# Patient Record
Sex: Female | Born: 1965
Health system: Southern US, Community
[De-identification: ages and names within clinical notes are randomized; demographics above are authoritative.]

## PROBLEM LIST (undated history)

## (undated) DIAGNOSIS — Z8669 Personal history of other diseases of the nervous system and sense organs: Secondary | ICD-10-CM

## (undated) DIAGNOSIS — H269 Unspecified cataract: Secondary | ICD-10-CM

## (undated) DIAGNOSIS — T7840XA Allergy, unspecified, initial encounter: Secondary | ICD-10-CM

## (undated) DIAGNOSIS — F419 Anxiety disorder, unspecified: Secondary | ICD-10-CM

## (undated) DIAGNOSIS — M1990 Unspecified osteoarthritis, unspecified site: Secondary | ICD-10-CM

## (undated) DIAGNOSIS — E785 Hyperlipidemia, unspecified: Secondary | ICD-10-CM

## (undated) HISTORY — PX: EYE SURGERY: SHX253

## (undated) HISTORY — PX: LASIK: SHX215

## (undated) HISTORY — DX: Anxiety disorder, unspecified: F41.9

## (undated) HISTORY — PX: CHOLECYSTECTOMY OPEN: SUR202

## (undated) HISTORY — DX: Unspecified cataract: H26.9

## (undated) HISTORY — DX: Allergy, unspecified, initial encounter: T78.40XA

## (undated) HISTORY — DX: Unspecified osteoarthritis, unspecified site: M19.90

## (undated) HISTORY — PX: LEEP: SHX91

## (undated) HISTORY — DX: Hyperlipidemia, unspecified: E78.5

---

## 1998-08-27 ENCOUNTER — Inpatient Hospital Stay (HOSPITAL_COMMUNITY): Admission: AD | Admit: 1998-08-27 | Discharge: 1998-08-27 | Payer: Self-pay | Admitting: Obstetrics and Gynecology

## 1998-10-14 ENCOUNTER — Encounter: Payer: Self-pay | Admitting: Obstetrics and Gynecology

## 1998-10-14 ENCOUNTER — Ambulatory Visit (HOSPITAL_COMMUNITY): Admission: RE | Admit: 1998-10-14 | Discharge: 1998-10-14 | Payer: Self-pay | Admitting: Obstetrics and Gynecology

## 1999-01-11 ENCOUNTER — Inpatient Hospital Stay (HOSPITAL_COMMUNITY): Admission: AD | Admit: 1999-01-11 | Discharge: 1999-01-11 | Payer: Self-pay | Admitting: Obstetrics and Gynecology

## 1999-03-06 ENCOUNTER — Inpatient Hospital Stay (HOSPITAL_COMMUNITY): Admission: AD | Admit: 1999-03-06 | Discharge: 1999-03-09 | Payer: Self-pay | Admitting: Obstetrics and Gynecology

## 1999-03-14 ENCOUNTER — Encounter: Admission: RE | Admit: 1999-03-14 | Discharge: 1999-06-12 | Payer: Self-pay | Admitting: Obstetrics and Gynecology

## 1999-04-18 ENCOUNTER — Other Ambulatory Visit: Admission: RE | Admit: 1999-04-18 | Discharge: 1999-04-18 | Payer: Self-pay | Admitting: Obstetrics and Gynecology

## 2000-03-22 ENCOUNTER — Other Ambulatory Visit: Admission: RE | Admit: 2000-03-22 | Discharge: 2000-03-22 | Payer: Self-pay | Admitting: Obstetrics and Gynecology

## 2000-06-24 ENCOUNTER — Other Ambulatory Visit: Admission: RE | Admit: 2000-06-24 | Discharge: 2000-06-24 | Payer: Self-pay | Admitting: Obstetrics and Gynecology

## 2000-09-09 ENCOUNTER — Inpatient Hospital Stay (HOSPITAL_COMMUNITY): Admission: AD | Admit: 2000-09-09 | Discharge: 2000-09-09 | Payer: Self-pay | Admitting: Obstetrics and Gynecology

## 2000-09-20 ENCOUNTER — Inpatient Hospital Stay (HOSPITAL_COMMUNITY): Admission: AD | Admit: 2000-09-20 | Discharge: 2000-09-20 | Payer: Self-pay | Admitting: Obstetrics and Gynecology

## 2000-10-15 ENCOUNTER — Other Ambulatory Visit: Admission: RE | Admit: 2000-10-15 | Discharge: 2000-10-15 | Payer: Self-pay | Admitting: Obstetrics and Gynecology

## 2000-11-14 ENCOUNTER — Encounter: Payer: Self-pay | Admitting: Obstetrics and Gynecology

## 2000-11-14 ENCOUNTER — Ambulatory Visit (HOSPITAL_COMMUNITY): Admission: RE | Admit: 2000-11-14 | Discharge: 2000-11-14 | Payer: Self-pay | Admitting: Obstetrics and Gynecology

## 2001-04-15 ENCOUNTER — Encounter (INDEPENDENT_AMBULATORY_CARE_PROVIDER_SITE_OTHER): Payer: Self-pay

## 2001-04-15 ENCOUNTER — Inpatient Hospital Stay (HOSPITAL_COMMUNITY): Admission: AD | Admit: 2001-04-15 | Discharge: 2001-04-17 | Payer: Self-pay | Admitting: Obstetrics and Gynecology

## 2004-05-22 ENCOUNTER — Emergency Department (HOSPITAL_COMMUNITY): Admission: EM | Admit: 2004-05-22 | Discharge: 2004-05-22 | Payer: Self-pay | Admitting: Emergency Medicine

## 2005-05-02 ENCOUNTER — Ambulatory Visit (HOSPITAL_COMMUNITY): Admission: RE | Admit: 2005-05-02 | Discharge: 2005-05-02 | Payer: Self-pay | Admitting: Obstetrics and Gynecology

## 2006-05-07 ENCOUNTER — Ambulatory Visit (HOSPITAL_COMMUNITY): Admission: RE | Admit: 2006-05-07 | Discharge: 2006-05-07 | Payer: Self-pay | Admitting: Obstetrics and Gynecology

## 2006-08-01 ENCOUNTER — Inpatient Hospital Stay (HOSPITAL_COMMUNITY): Admission: AD | Admit: 2006-08-01 | Discharge: 2006-08-06 | Payer: Self-pay | Admitting: Gastroenterology

## 2006-08-01 ENCOUNTER — Encounter: Payer: Self-pay | Admitting: Gastroenterology

## 2006-08-01 ENCOUNTER — Encounter (INDEPENDENT_AMBULATORY_CARE_PROVIDER_SITE_OTHER): Payer: Self-pay | Admitting: Specialist

## 2006-08-14 ENCOUNTER — Ambulatory Visit (HOSPITAL_COMMUNITY): Admission: RE | Admit: 2006-08-14 | Discharge: 2006-08-14 | Payer: Self-pay | Admitting: General Surgery

## 2006-08-25 ENCOUNTER — Emergency Department (HOSPITAL_COMMUNITY): Admission: EM | Admit: 2006-08-25 | Discharge: 2006-08-25 | Payer: Self-pay | Admitting: Emergency Medicine

## 2006-08-28 ENCOUNTER — Ambulatory Visit (HOSPITAL_COMMUNITY): Admission: RE | Admit: 2006-08-28 | Discharge: 2006-08-28 | Payer: Self-pay | Admitting: Surgery

## 2006-11-11 ENCOUNTER — Ambulatory Visit (HOSPITAL_COMMUNITY): Admission: RE | Admit: 2006-11-11 | Discharge: 2006-11-11 | Payer: Self-pay | Admitting: Gastroenterology

## 2007-05-09 ENCOUNTER — Ambulatory Visit (HOSPITAL_COMMUNITY): Admission: RE | Admit: 2007-05-09 | Discharge: 2007-05-09 | Payer: Self-pay | Admitting: Obstetrics and Gynecology

## 2008-05-11 ENCOUNTER — Ambulatory Visit (HOSPITAL_COMMUNITY): Admission: RE | Admit: 2008-05-11 | Discharge: 2008-05-11 | Payer: Self-pay | Admitting: Obstetrics and Gynecology

## 2009-05-26 ENCOUNTER — Ambulatory Visit (HOSPITAL_COMMUNITY): Admission: RE | Admit: 2009-05-26 | Discharge: 2009-05-26 | Payer: Self-pay | Admitting: Obstetrics and Gynecology

## 2010-08-06 ENCOUNTER — Ambulatory Visit: Payer: BC Managed Care – PPO | Admitting: Family Medicine

## 2010-08-06 ENCOUNTER — Encounter: Payer: Self-pay | Admitting: Family Medicine

## 2010-08-06 DIAGNOSIS — J069 Acute upper respiratory infection, unspecified: Secondary | ICD-10-CM

## 2010-08-06 DIAGNOSIS — J019 Acute sinusitis, unspecified: Secondary | ICD-10-CM

## 2010-08-06 DIAGNOSIS — R300 Dysuria: Secondary | ICD-10-CM

## 2010-08-06 DIAGNOSIS — G43909 Migraine, unspecified, not intractable, without status migrainosus: Secondary | ICD-10-CM | POA: Insufficient documentation

## 2010-08-06 DIAGNOSIS — N390 Urinary tract infection, site not specified: Secondary | ICD-10-CM

## 2010-08-06 LAB — CONVERTED CEMR LAB
Nitrite: NEGATIVE
Protein, U semiquant: NEGATIVE
Urobilinogen, UA: 0.2
pH: 6.5

## 2010-08-08 ENCOUNTER — Telehealth: Payer: Self-pay | Admitting: Internal Medicine

## 2010-08-17 NOTE — Progress Notes (Signed)
  Phone Note Outgoing Call   Call placed by: Hyacinth Meeker md Call placed to: Patient Summary of Call: left message to return call. Was going to tell her of urine c & s. Her e.coli responsive to levaquin     Appended Document:  Patient returned call, is improved, and given info.

## 2010-08-17 NOTE — Assessment & Plan Note (Signed)
Summary: UTI   Vital Signs:  Patient Profile:   45 Years Old Female CC:      UTI// and URI  Height:     66.50 inches Weight:      164 pounds O2 Sat:      99 % O2 treatment:    Room Air Temp:     98.2 degrees F oral Pulse rate:   88 / minute Pulse rhythm:   regular Resp:     16 per minute BP sitting:   120 / 80  (left arm) Cuff size:   regular  Pt. in pain?   no  Vitals Entered By: Providence Crosby LPN (08/24/2010 11:50 AM)                   Current Allergies (reviewed today): ! * SEPTRAHistory of Present Illness History from: patient Reason for visit: see chief complaint Chief Complaint: UTI and URI  History of Present Illness: 2 weeks ago had uti symptoms : burning frequency and itching now started 07/22/2010: on Monday 07/31/2010 started having URI symptoms cough productive ; phelgm tan and white in color;  She says that she hasn't been drinking a lot of fluids and having to hold her urine for long periods of time working as a Engineer, civil (consulting).   No fever;  No vomiting, abdominal pain or nausea;  Also, pt having some sinus pressure and occasional sneezing. She is having a cough;     REVIEW OF SYSTEMS Constitutional Symptoms      Denies fever, chills, night sweats, weight loss, weight gain, and fatigue.  Eyes       Complains of glasses.      Denies change in vision, eye pain, eye discharge, contact lenses, and eye surgery. Ear/Nose/Throat/Mouth       Complains of hoarseness.      Denies hearing loss/aids, change in hearing, ear pain, ear discharge, dizziness, frequent runny nose, frequent nose bleeds, sinus problems, sore throat, and tooth pain or bleeding.  Respiratory       Complains of productive cough and shortness of breath.      Denies dry cough, wheezing, asthma, bronchitis, and emphysema/COPD.      Comments: a/w heavy coughing Cardiovascular       Denies murmurs, chest pain, and tires easily with exhertion.    Gastrointestinal       Denies stomach pain, nausea/vomiting,  diarrhea, constipation, blood in bowel movements, and indigestion. Genitourniary       Complains of painful urination.      Denies kidney stones and loss of urinary control. Neurological       Denies paralysis, seizures, and fainting/blackouts. Musculoskeletal       Denies muscle pain, joint pain, joint stiffness, decreased range of motion, redness, swelling, muscle weakness, and gout.  Skin       Denies bruising, unusual mles/lumps or sores, and hair/skin or nail changes.  Psych       Denies mood changes, temper/anger issues, anxiety/stress, speech problems, depression, and sleep problems.  Past History:  Family History: Last updated: 2010/08/24 Father: Deceased at 61 MI due to blood clot Mother: Deceased at 85 due to Lung cancer Siblings: 2 brothers alive and well  Social History: Last updated: 2010/08/24 Marital Status: Married Occupation: Designer, jewellery [employer] Never Smoked Alcohol use-no Drug use-no Regular exercise-yes  Past Medical History: Family Planning Migraine Headaches  Past Surgical History: Open Cholecystectomy 07/2006 [facility]  Jan 2011  Family History: Father:  Deceased at 12 MI due to blood clot Mother: Deceased at 83 due to Lung cancer Siblings: 2 brothers alive and well  Social History: Marital Status: Married Occupation: Associate Professor System Never Smoked Alcohol use-no Drug use-no Regular exercise-yes Smoking Status:  never Drug Use:  no Does Patient Exercise:  yes Physical Exam General appearance: well developed, well nourished, no acute distress Head: normocephalic, atraumatic Eyes: conjunctivae and lids normal Pupils: equal, round, reactive to light Ears: normal, no lesions or deformities Nasal: mucosa pink, nonedematous, no septal deviation, turbinates normal Oral/Pharynx: tongue normal, posterior pharynx without erythema or exudate Neck: neck supple,  trachea midline, no masses Chest/Lungs: upper  anterior airway noises heard; no rales, wheezes, or rhonchi bilateral, breath sounds equal without effort Heart: regular rate and  rhythm, no murmur Abdomen: soft, non-tender without obvious organomegaly GU: suprapubic tenderness Extremities: normal extremities Neurological: grossly intact and non-focal Skin: no obvious rashes or lesions MSE: oriented to time, place, and person Assessment Problems:   New Problems: UTI (ICD-599.0) DYSURIA (ICD-788.1) ACUTE SINUSITIS, UNSPECIFIED (ICD-461.9) UPPER RESPIRATORY INFECTION, ACUTE (ICD-465.9) Hx of MIGRAINE HEADACHE (ICD-346.90)   Patient Education: Patient and/or caregiver instructed in the following: rest, fluids. The risks, benefits and possible side effects were clearly explained and discussed with the patient.  The patient verbalized clear understanding.  The patient was given instructions to return if symptoms don't improve, worsen or new changes develop.  If it is not during clinic hours and the patient cannot get back to this clinic then the patient was told to seek medical care at an available urgent care or emergency department.  The patient verbalized understanding.    Plan New Medications/Changes: TUSSIN DM 100-10 MG/5ML SYRP (DEXTROMETHORPHAN-GUAIFENESIN) take 1 teaspoon by mouth every 6 hours as needed for cough and congestion  #5 oz x 0, 08/06/2010, Clanford Johnson MD PYRIDIUM 100 MG TABS (PHENAZOPYRIDINE HCL) take 1 by mouth three times a day x 2 days  #6 x 0, 08/06/2010, Clanford Johnson MD LEVOFLOXACIN 500 MG TABS (LEVOFLOXACIN) take 1 by mouth daily  #10 x 0, 08/06/2010, Clanford Johnson MD  Follow Up: Follow up in 2-3 days if no improvement, Follow up on an as needed basis, Follow up with Primary Physician  The patient and/or caregiver has been counseled thoroughly with regard to medications prescribed including dosage, schedule, interactions, rationale for use, and possible side effects and they verbalize understanding.   Diagnoses and expected course of recovery discussed and will return if not improved as expected or if the condition worsens. Patient and/or caregiver verbalized understanding.  Prescriptions: TUSSIN DM 100-10 MG/5ML SYRP (DEXTROMETHORPHAN-GUAIFENESIN) take 1 teaspoon by mouth every 6 hours as needed for cough and congestion  #5 oz x 0   Entered and Authorized by:   Standley Dakins MD   Signed by:   Standley Dakins MD on 08/06/2010   Method used:   Electronically to        [facility] [address] (retail)       [address], [address]       [place], [place]  [address]       Ph: [phone]       Fax: [phone]   RxID:   [id number] PYRIDIUM 100 MG TABS (PHENAZOPYRIDINE HCL) take 1 by mouth three times a day x 2 days  #6 x 0   Entered and Authorized by:   Standley Dakins MD   Signed by:   Theodosia Quay  Johnson MD on 08/06/2010   Method used:   Electronically to        [facility] [address] (retail)       [address], [address]       [place], [place]  [address]       Ph: [phone]       Fax: [phone]   RxID:   [id number] LEVOFLOXACIN 500 MG TABS (LEVOFLOXACIN) take 1 by mouth daily  #10 x 0   Entered and Authorized by:   Standley Dakins MD   Signed by:   Standley Dakins MD on 08/06/2010   Method used:   Electronically to        [facility] [address] (retail)       [address], [address]       [place], [place]  [address]       Ph: [phone]       Fax: [phone]   RxID:   [id number]   Patient Instructions: 1)  Go to the pharmacy and pick up your prescription (s).  It may take up to 30 mins for electronic prescriptions to be delivered to the pharmacy.  Please call if your pharmacy has not received your prescriptions after 30 minutes.   2)  A urine culture has been ordered.  We will call with results when available. Please call us if you have not heard anything in 5  days.  3)  Return or go to the ER if no improvement or symptoms getting worse.   4)  Drink plenty of fluids to stay hydrated.  5)  If you get nausea or vomiting please return or go to ER.  6)  The patient was informed that there is no on-call provider or services available at this clinic during off-hours (when the clinic is closed).  If the patient developed a problem or concern that required immediate attention, the patient was advised to go the the nearest available urgent care or emergency department for medical care.  The patient verbalized understanding.       Laboratory Results   Urine Tests  Date/Time Received: August 06, 2010 12:02 PM Date/Time Reported: August 06, 2010 12:02 PM  Routine Urinalysis   Color: yellow Appearance: Cloudy Bilirubin: negative   (Normal Range: Negative) Ketone: negative   (Normal Range: Negative) Spec. Gravity: <1.005   (Normal Range: 1.003-1.035) Blood: moderate   (Normal Range: Negative) pH: 6.5   (Normal Range: 5.0-8.0) Protein: negative   (Normal Range: Negative) Urobilinogen: 0.2   (Normal Range: 0-1) Nitrite: negative   (Normal Range: Negative) Leukocyte Esterace: moderate   (Normal Range: Negative)

## 2010-08-22 NOTE — Letter (Signed)
Summary: History form   History form   Imported By: Eugenio Hoes 08/14/2010 15:25:56  _____________________________________________________________________  External Attachment:    Type:   Image     Comment:   External Document

## 2010-09-25 ENCOUNTER — Other Ambulatory Visit (HOSPITAL_COMMUNITY): Payer: Self-pay | Admitting: Obstetrics and Gynecology

## 2010-09-25 DIAGNOSIS — Z1231 Encounter for screening mammogram for malignant neoplasm of breast: Secondary | ICD-10-CM

## 2010-10-03 NOTE — Op Note (Signed)
[name], Christine Bishop             ACCOUNT NO.:  [id number]   MEDICAL RECORD NO.:  [id number]          PATIENT TYPE:  AMB   LOCATION:  ENDO                         FACILITY:  [facility]   PHYSICIAN:  Anselmo Rod, M.D.  DATE OF BIRTH:  [birth year]   DATE OF PROCEDURE:  11/11/2006  DATE OF DISCHARGE:                               OPERATIVE REPORT   PROCEDURE PERFORMED:  Screening colonoscopy.   ENDOSCOPIST:  Dr. Anselmo Rod.   INSTRUMENT USED:  Pentax video colonoscope.   INDICATIONS FOR PROCEDURE:  45 year old white female with a family  history of colon cancer in maternal grandmother undergoing screening  colonoscopy to rule out colonic polyps, masses, etc.   PREPROCEDURE PREPARATION:  Informed consent was procured from the  patient.  The patient fasted for 8 hours prior to the procedure and  prepped with a bottle of magnesium citrate and a gallon of Nulytely the  night prior to the procedure. Risks and benefits of the procedure  including a 10% miss rate of cancer and polyps were discussed with the  patient as well.   PREPROCEDURE PHYSICAL:  The patient had stable vital signs.  NECK:  Supple.  CHEST:  Clear to auscultation. S1 and S2 regular.  Abdomen soft  with normal bowel sounds.   DESCRIPTION OF PROCEDURE:  The patient was placed in the left lateral  decubitus position and sedated with 100 mcg of Fentanyl and 10 mg of  Versed given intravenously in slow incremental doses. Once the patient  was adequately sedated and maintained on low-flow oxygen and continuous  cardiac monitoring the Pentax video colonoscope was advanced from the  rectum to cecum.  The appendiceal orifice and cecal valve were  visualized after multiple washes. The terminal ileum appeared healthy  and without lesions.  No masses, polyps, erosions, ulcerations or  diverticula noted.  Retroflexion in the rectum revealed no  abnormalities. The patient tolerated the procedure well without  immediate  complications.   IMPRESSION:  Normal colonoscopy up to the terminal ileum. No masses,  polyps, erosions, ulcerations or diverticula noted.   RECOMMENDATIONS:  1. A repeat colonoscopy has been recommended at the age of 45 unless      the patient has any abnormal symptoms in interim in which case she      should contact the office immediately for further recommendations.  2. Outpatient follow-up as need arises in the future.      Anselmo Rod, M.D.  Electronically Signed     JNM/MEDQ  D:  11/11/2006  T:  11/11/2006  Job:  [id number]   cc:   Aida Puffer  Fax: [phone]

## 2010-10-06 NOTE — Discharge Summary (Signed)
[facility]  Patient:    Christine Bishop, Christine Bishop Visit Number: [id number] MRN: [id number]          Service Type: OBS Location: 910A 9101 01 Attending Physician:  Malon Kindle Dictated by:   Malachi Pro. Ambrose Mantle, M.D. Admit Date:  04/15/2001 Discharge Date: 04/17/2001                             Discharge Summary  HISTORY OR PRESENT ILLNESS:   A 45 year old white married female, para 1-0-0-1, gravida 2.  Last period June 21, 2000.  [facility] April 07, 2001 by dates, but April 16, 2001 by ultrasound.  Admitted for induction of labor with favorable cervix.  Blood group and type O-positive with a negative antibody.  Nonreactive serology.  Rubella immune.  Hepatitis B surface antigen negative.  HIV negative.  GC and Chlamydia negative.  Triple screen declined. Urinary tract infection treated with Macrobid.  Group B strep positive. One-hour Glucola 82.  Vaginal ultrasound on Sep 26, 2000, crown rump length 4.15 cm, 11 weeks 1 day [facility] April 16, 2001.  Patient declined amnio. Repeat ultrasound November 14, 2000, average gestational age, 42 weeks 6 days, [facility] April 16, 2001.  On April 07, 2001, the cervix was a tight fingertip. By April 14, 2001, the cervix was 2 cm, 50%, vertex, and a -2 to -3.  She was admitted for induction of labor.  Pitocin was begun at approximately 6:30 a.m. on April 15, 2001 and her contractions became somewhat regular.  PAST MEDICAL HISTORY:  ALLERGIES:                    Sulfa caused vomiting.  ILLNESSES:                    Migraines as a child.  OPERATIONS:                   None.  SOCIAL HISTORY:               Alcohol, tobacco and drugs: None.  FAMILY HISTORY:               Mother with thyroid dysfunction.  Father with high blood pressure.  OBSTETRIC HISTORY:            In October of 2000, 8 pound 7 ounce female, vaginal delivery with low forceps.  PHYSICAL EXAM:  VITAL SIGNS:                  On  admission, the patients temperature was normal.  Blood pressure 111/69, pulse 66, respirations 18.  ABDOMEN:                      Soft and nontender.  Fundal height was 38 cm. On April 14, 2001, fetal heart tones were normal.  There were no decelerations, good accelerations.  The cervix was 3 cm/70%/vertex/-2 to -3 station.  Artificial rupture of the membranes was performed, with clear fluid.  IMPRESSIONS ON ADMISSION:    1. Intrauterine pregnancy at 39 weeks.                               2. Positive group B strep.  HOSPITAL C0URSE:              Patient was placed on intravenous Pitocin and IV penicillin.  By 9:08 a.m., the cervix was 3-4 cm/80%/vertex at a -1 to -2. The Pitocin was at 8 mU/min and contractions every 3-4 minutes.  She requested and received an epidural.  She progressed to full dilatation and brought the vertex to a crowning position.  Fetal heart bradycardia developed and midline episiotomy made to facilitate delivery.  The patient delivered spontaneously away over the midline episiotomy under local block, a living female infant. The infants weight was 7 pounds 4 ounces.  Apgars were 8 at one and 9 and five minutes.  The placenta was intact.  Uterus was normal, but boggy.  Rectal was done twice after removal of two perineal cysts.  One was 2x2 cm; the other 4x4 mm.  Midline episiotomy repaired with 3-0 Dexon.  Blood loss about 400-500 cc.  After delivery, the patient had a couple of clots.  The uterus was rechecked.  Approximately 40 cc of additional clot was removed.  The uterus felt firmer.  We emptied the bladder and from there on, the patients bleeding was quite acceptable.  LABORATORY DATA:              She was noted to have hemoglobin of 12.2 on admission, hematocrit 35.8, white count of 6900, platelet count 119,000. Follow-up hemoglobin 11.0 with platelet count of 96,000. RPR was nonreactive.  FINAL DIAGNOSES:              1. Intrauterine pregnancy, 39  weeks,                                  delivered occiput anterior.                               2. Perineal cyst x2.                               3. Thrombocytopenia.  OPERATIONS:                   1. Spontaneous delivery, occiput anterior.                               2. Midline episiotomy and repair.                               3. Removal of perineal cysts.  Pathology report                                  on the cyst is pending.  FINAL CONDITION:              Improved.  INSTRUCTIONS:                 Includes our regular discharge instruction booklet.  The patient is advised to return to the office in six weeks for follow-up examination.  Percocet 5/325, 16 tablets one every 4-6 hours as needed for pain was given at discharge and the patient is asked to return in six weeks.  I think we should draw a platelet count at the time of her six weeks checkup to ensure that the thrombocytopenia has resolved. Dictated by:   Maisie Fus  Corwin Levins, M.D. Attending Physician:  Malon Kindle DD:  04/17/01 TD:  04/17/01 Job: [id number]

## 2010-10-06 NOTE — Op Note (Signed)
Christine Bishop, Christine Bishop             ACCOUNT NO.:  [id number]   MEDICAL RECORD NO.:  [id number]          PATIENT TYPE:  INP   LOCATION:  5150                         FACILITY:  [facility]   PHYSICIAN:  Thornton Park. Daphine Deutscher, MD  DATE OF BIRTH:  22-Mar-1966   DATE OF PROCEDURE:  08/01/2006  DATE OF DISCHARGE:                               OPERATIVE REPORT   PREOPERATIVE DIAGNOSIS:  Four-day history of abdominal pain with  elevated liver function studies.   PROCEDURE:  Laparoscopy, open common bile duct exploration and  cholecystectomy with intraoperative cholangiography x2.   SURGEON:  Thornton Park. Daphine Deutscher, MD   ASSISTANTS:  Dr. Ezzard Standing and Dr. Jamey Ripa.   ANESTHESIA:  General endotracheal.   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   DESCRIPTION OF PROCEDURE:  Christine Bishop was taken to the OR at [facility]  on 03/13 and given general anesthesia.  The abdomen was prepped with  Techni-Care and draped sterilely.  I entered through a Hasson approach  without difficulty, insufflated the abdomen, placed three trocars  without difficulty.  The gallbladder was grasped and elevated and  initially did not look to be that bad up near the fundus.  It was a long  gallbladder but near the infundibulum it was stuck and fused to the  duodenum.  These adhesions some of which appeared to be very chronic  were taken down with sharp dissection.  I then found a very long  convoluted infundibulum and everything was stuck and I used gentle  hydrodissection to begin my dissection and took down the gallbladder  attachments more proximally up near the liver to try to get a window  around that and did that and I went down lower and dissected free where  this gallstone appeared to be ef facing was trying to erode through what  appeared to the neck of the gallbladder or the  common bile duct.  At  that point I dissected this around and identified the funnel but felt  that this was likely going around the common bile duct.  Before I  dissected around the common duct, identified this and got Dr. Ezzard Standing to  come in and verify this and together we went above and really did a good  job dissecting above and then I came down to this effacing stone.  I  felt that if this of the common duct that I would make a little  transverse incision to try to get the stone out but would do a 2-0  cholangiogram.  I did this and I got just distal filling alone despite  manipulating the stone and tried to get upstream filling.   Therefore went ahead and opened without much ado.  I retrieved the first  stone which was very rock-hard and crystallized.  The other two stones  would not pass.  There was also what I identified to my earlier  dissection there appeared to be an accessory right hepatic going up onto  the  gallbladder and this proved I think to be the case.  I then went  ahead and took the gallbladder down from fundus down to the area in  question.  I opened the gallbladder and retrieved the stones from below  and demonstrated that where I had in fact opened was right at the cystic  common duct junction.  I went ahead and removed the stones, irrigated  and placed a T tube and closed with 4-0 Vicryl.  I did T tube  cholangiogram which showed nice proximal and distal filling and then I  amputated the remnant leaving some remnant of gallbladder using an Endo-  GIA blue cartridge.  There no leaks noted.  The T-tube was brought  through separate stab incision and anchored with two 3-0 nylons.  A JP  was placed beneath this and into the foramen of Winslow and anchored  with 3-0 nylon as well.   Sponge and needle counts reported as correct.  The wound was closed with  running #1 PDS from both sides medial and lateral tying in the middle  after closing in two layers, a posterior  layer and an anterior layer.  Wound was irrigated and closed with staples.  Prior to doing this, I did  close umbilical defect with a single interrupted 0-0 Vicryl.  The  patient seemed to tolerate procedure well, was taken to recovery room in  satisfactory condition.      Thornton Park Daphine Deutscher, MD  Electronically Signed     MBM/MEDQ  D:  08/01/2006  T:  08/03/2006  Job:  [id number]   cc:   Jordan Hawks. Elnoria Howard, MD

## 2010-10-06 NOTE — Discharge Summary (Signed)
Christine Bishop, Christine Bishop             ACCOUNT NO.:  [id number]   MEDICAL RECORD NO.:  [id number]          PATIENT TYPE:  INP   LOCATION:  5150                         FACILITY:  [facility]   PHYSICIAN:  Angelia Mould. Derrell Lolling, M.D.DATE OF BIRTH:  [birth year]   DATE OF ADMISSION:  08/01/2006  DATE OF DISCHARGE:  08/06/2006                               DISCHARGE SUMMARY   FINAL DIAGNOSES:  1. Gallstones.  2. Mirizzi syndrome.   OPERATIONS PERFORMED:  Laparoscopy, cholecystectomy, open common bile  duct exploration, with intraoperative cholangiogram.   DATE OF SURGERY:  August 01, 2006.   HISTORY:  This is a 45 year old woman admitted on March13, 2008 by Dr.  Jeani Hawking.  Otherwise healthy.  She was complaining of right upper  quadrant pain and nausea.  Lab values showed elevated liver function  tests, with an AST of 828, an ALT of 710, an alkaline phosphatase of  230, and total bilirubin of 2.8.  An ultrasound was obtained showing  gallstones but no evidence of cholecystitis.  She was admitted by Dr.  Elnoria Howard.  Dr. Luretha Murphy was asked to see her.  He felt she needed a  cholecystectomy.   HOSPITAL COURSE:  On the day of admission, the patient was taken to the  operating room by Dr. Luretha Murphy.  He describes gallstones and  Mirizzi syndrome, with three large gallstones impacted in a narrow  parallel cystic duct, with partial proper hepatic duct occlusion, and  with effacement of the anterior common duct.  He describes a difficult  dissection separating out this anatomy.  He did open the common duct to  identify the anatomy.  He did place a T-tube.   Postoperatively, the patient did fairly well.  The T-tube was draining  clear bile.  She advanced her diet and activities and got her Foley out.  She was doing well and was discharged on March18, 2008.  At that time,  she was feeling better, tolerating diet, but not having much pain.  The  T-tube drained about 500 cc in a 24-hour period.   Clear bile.  All of  her lab work looked very good, with just minimal elevation of liver  function tests.  She was given a prescription for Vicodin for pain and  asked to return see Dr. Daphine Deutscher and 1 week to arrange for an outpatient T-  tube cholangiogram.      Angelia Mould. Derrell Lolling, M.D.  Electronically Signed    HMI/MEDQ  D:  09/16/2006  T:  09/16/2006  Job:  [id number]   cc:   Jordan Hawks. Elnoria Howard, MD  Anselmo Rod, M.D.  Thornton Park Daphine Deutscher, MD

## 2010-10-06 NOTE — Consult Note (Signed)
NAMEKANDY, TOWERY             ACCOUNT NO.:  [id number]   MEDICAL RECORD NO.:  [id number]          PATIENT TYPE:  EMS   LOCATION:  MAJO                         FACILITY:  [facility]   PHYSICIAN:  Thornton Park. Daphine Deutscher, MD  DATE OF BIRTH:  Sep 05, 1965   DATE OF CONSULTATION:  08/25/2006  DATE OF DISCHARGE:  08/25/2006                                 CONSULTATION   HISTORY:  Christine Bishop is a 45 year old lady who on March 13  underwent cholecystectomy with common bile duct exploration for multiple  stones impacted in her cystic duct, impinging on her common bile duct  requiring common duct exploration.  She had a T tube cholangiogram  ordered about March 26 which the report was read as normal, showing no  extravasation and normal emptying.  I reviewed today for the first time  it does empty fine, although the intrahepatic radicals are a little more  dilated.  I saw her in the office on Thursday, April 3, and with that  aforementioned report, went ahead and clamped the T-tube on Thursday.  On Friday, she started having some nausea and vomiting and that was  worse on Friday, better on Saturday and may be a little better today,  but she is brought to the ER by her husband.  She is really having just  a little bit of pulling around the side of the T-tube where it was  secured the skin, but was not really having severe abdominal pain.   I examined her in the hall where she was placed after triage.  We had  obtained some laboratory included a CBC which appears normal with a  hemoglobin over 14 and a normal white count in the 4000 range with a  normal differential.  Her CMET again showed some slight abnormalities of  her liver function studies and her SGOT/SGPT slightly elevated, alkaline  phosphatase slightly elevated, but with a normal bilirubin.   On exam, she is nontender in the right upper quadrant and her T-tube  capped as I placed a cap on it last Thursday.   She looks fairly  comfortable at the present time and would like to go  ahead and go home.   My plan was to give her Phenergan suppositories 25 mg to take every 6  hours if needed for nausea, and to schedule a T tube cholangiogram on  this Wednesday, April 10.  I gave her Dr. Laurell Josephs Thompson's name as he is  the doctor of the week this week as  will be out of town.  Should she  have problems, she may call our office, but will be referred hopefully  to Dr. Janee Morn and to this service.   IMPRESSION:  Nausea, etiology unclear.   PLAN:  Continue clamped T-tube but with a T-tube cholangiogram on  Wednesday.      Thornton Park Daphine Deutscher, MD  Electronically Signed     MBM/MEDQ  D:  08/25/2006  T:  08/26/2006  Job:  [id number]   cc:   Anselmo Rod, M.D.

## 2010-10-06 NOTE — H&P (Signed)
NAMEKIMMBERLY, Christine Bishop             ACCOUNT NO.:  [id number]   MEDICAL RECORD NO.:  [id number]          PATIENT TYPE:  INP   LOCATION:  5150                         FACILITY:  [facility]   PHYSICIAN:  Jordan Hawks. Elnoria Howard, MD    DATE OF BIRTH:  [birth year]   DATE OF ADMISSION:  08/01/2006  DATE OF DISCHARGE:                              HISTORY & PHYSICAL   REASON FOR ADMISSION:  Right upper quadrant pain, nausea and vomiting.   HISTORY OF PRESENT ILLNESS:  This is a 45 year old female, without any  significant past medical history, who initially presented to the office  on an outpatient basis with complaints of right upper quadrant pain and  nausea.  She states that her pain was severe and it has intensified over  the past 3 days prior to the office visit.  She reports having a  significant amount of nausea and vomiting and Phenergan does not seem to  resolve her symptoms.  In the past, weight loss has been an issue;  however, it has only been minor over approximately 3 pounds in the past  2 days.  Laboratory values were obtained on an outpatient basis and she  was noted to have a significant elevation in her transaminases with an  AST of 828, ALT is 710 and alkaline phosphatase of 230 with a total  bilirubin of 2.8.  Because of the progression of her symptoms and the  abnormal liver enzymes, a right upper quadrant ultrasound was obtained.  She subsequently presented to the emergency room and it was then  determined that she has cholelithiasis without any evidence of  cholecystitis.  Subsequently, a surgical consult was obtained and Dr.  Baxter Kail felt that she required a cholecystectomy.   PAST MEDICAL HISTORY AND PAST SURGICAL HISTORY:  As stated above.   FAMILY HISTORY:  Noncontributory.   SOCIAL HISTORY:  Social history is negative for alcohol, tobacco or  illicit drug use.  No known drug allergies.   REVIEW OF SYSTEMS:  Unable to obtain at this time as she is in the  operating  room.  Vital signs were reportedly normal.   PHYSICAL EXAMINATION:  Unable to obtain again as the patient is in the  operating room.   ASSESSMENT:  Cholelithiasis without overt evidence of cholecystitis.  Given her symptoms, it is apparent that she does require a  cholecystectomy which is currently underway at this time.  It was  initially planned to be under a skilled laparoscopic approach; however,  because of the complexity of the procedure, she has now converted to an  open cholecystectomy.  Further management will be made pending the  surgery.      Jordan Hawks Elnoria Howard, MD  Electronically Signed    PDH/MEDQ  D:  08/01/2006  T:  08/03/2006  Job:  [id number]   cc:   Thornton Park Daphine Deutscher, MD

## 2010-10-12 ENCOUNTER — Ambulatory Visit (HOSPITAL_COMMUNITY)
Admission: RE | Admit: 2010-10-12 | Discharge: 2010-10-12 | Disposition: A | Discharge status: Home / Self Care | Payer: BC Managed Care – PPO | Source: Ambulatory Visit | Attending: Obstetrics and Gynecology | Admitting: Obstetrics and Gynecology

## 2010-10-12 DIAGNOSIS — Z1231 Encounter for screening mammogram for malignant neoplasm of breast: Secondary | ICD-10-CM

## 2011-11-08 ENCOUNTER — Emergency Department (HOSPITAL_COMMUNITY): Payer: Federal, State, Local not specified - PPO

## 2011-11-08 ENCOUNTER — Emergency Department (HOSPITAL_COMMUNITY)
Admission: EM | Admit: 2011-11-08 | Discharge: 2011-11-08 | Disposition: A | Discharge status: Home / Self Care | Payer: Federal, State, Local not specified - PPO | Attending: Emergency Medicine | Admitting: Emergency Medicine

## 2011-11-08 ENCOUNTER — Encounter (HOSPITAL_COMMUNITY): Payer: Self-pay | Admitting: Emergency Medicine

## 2011-11-08 DIAGNOSIS — Z9089 Acquired absence of other organs: Secondary | ICD-10-CM | POA: Insufficient documentation

## 2011-11-08 DIAGNOSIS — S6390XA Sprain of unspecified part of unspecified wrist and hand, initial encounter: Secondary | ICD-10-CM | POA: Insufficient documentation

## 2011-11-08 DIAGNOSIS — S63616A Unspecified sprain of right little finger, initial encounter: Secondary | ICD-10-CM

## 2011-11-08 DIAGNOSIS — IMO0002 Reserved for concepts with insufficient information to code with codable children: Secondary | ICD-10-CM | POA: Insufficient documentation

## 2011-11-08 HISTORY — DX: Personal history of other diseases of the nervous system and sense organs: Z86.69

## 2011-11-08 NOTE — ED Provider Notes (Signed)
History     CSN: [id number]  Arrival date & time 11/08/11  1947   First MD Initiated Contact with Patient 11/08/11 2124      Chief Complaint  Patient presents with  . Hand Pain  . Arm Pain    (Consider location/radiation/quality/duration/timing/severity/associated sxs/prior treatment) HPI  46 year old female presents complaining of right hand injury. Patient states she was helping her husband with housework when a panel fell onto her R hand.  Pt sts the panel bends her R little finger backward and then falls on her forearm.  Paint to finger.  Denies pain to wrist or forearm.  Denies numbness, bleeding, or laceration.    Past Medical History  Diagnosis Date  . History of migraine headaches     Past Surgical History  Procedure Date  . Cholecystectomy open   . Leep   . Lasik     History reviewed. No pertinent family history.  History  Substance Use Topics  . Smoking status: Never Smoker   . Smokeless tobacco: Not on file  . Alcohol Use: No    OB History    Grav Para Term Preterm Abortions TAB SAB Ect Mult Living                  Review of Systems  Musculoskeletal: Negative for arthralgias.  Skin: Negative for rash.  Neurological: Negative for numbness.    Allergies  Sulfamethoxazole w-trimethoprim  Home Medications   Current Outpatient Rx  Name Route Sig Dispense Refill  . IBUPROFEN 200 MG PO TABS Oral Take 200 mg by mouth every 8 (eight) hours as needed. For pain    . NORETHIN ACE-ETH ESTRAD-FE 1-20 MG-MCG PO TABS Oral Take 1 tablet by mouth daily.    . SUMATRIPTAN SUCCINATE 100 MG PO TABS Oral Take 100 mg by mouth as needed. For migraines.      BP 125/79  Pulse 78  Temp 98.4 F (36.9 C) (Oral)  Resp 18  SpO2 100%  LMP 10/08/2011  Physical Exam  Nursing note and vitals reviewed. Constitutional: She appears well-developed and well-nourished. No distress.  HENT:  Head: Atraumatic.  Eyes: Conjunctivae are normal.  Neck: Neck supple.    Musculoskeletal:       Right hand: She exhibits tenderness and bony tenderness. She exhibits normal range of motion, no deformity and no swelling. normal sensation noted. Normal strength noted. She exhibits no finger abduction, no thumb/finger opposition and no wrist extension trouble.       Hands:      R hand: tenderness to PIP and DIP of R little finger.  No deformity or swelling noted.  Sensation intact.  Normal ROM to both flexion and extension.  Brisk cap refills  Neurological: She is alert.  Skin: Skin is warm. No rash noted.    ED Course  Procedures (including critical care time)  Labs Reviewed - No data to display Dg Hand Complete Right  11/08/2011  *RADIOLOGY REPORT*  Clinical Data: Garage door fell on hand.  Lateral hand pain.  RIGHT HAND - COMPLETE 3+ VIEW  Comparison: None.  Findings: No evidence of fracture or dislocation.  Mild osteoarthritis is seen involving the distal interphalangeal joint of the little finger.  No other significant bone abnormality identified.  IMPRESSION:  1.  No acute findings. 2.  Mild fifth DIP joint osteoarthritis.  Original Report Authenticated By: Danae Orleans, M.D.     No diagnosis found.  Results for orders placed in visit on 08/06/10  CONVERTED  [facility]      Component Value Range   Color, Urine yellow     APPearance Cloudy     Ketones, urine, test strip negative     Blood in Urine, dipstick moderate     Protein, U semiquant negative     Nitrite negative     Bilirubin Urine negative     Specific Gravity, Urine <1.005     pH 6.5     Urobilinogen, UA 0.2     WBC Urine, dipstick moderate     Dg Hand Complete Right  11/08/2011  *RADIOLOGY REPORT*  Clinical Data: Garage door fell on hand.  Lateral hand pain.  RIGHT HAND - COMPLETE 3+ VIEW  Comparison: None.  Findings: No evidence of fracture or dislocation.  Mild osteoarthritis is seen involving the distal interphalangeal joint of the little finger.  No other significant bone abnormality  identified.  IMPRESSION:  1.  No acute findings. 2.  Mild fifth DIP joint osteoarthritis.  Original Report Authenticated By: Danae Orleans, M.D.      MDM  R little finger injury.  Xray negative.  Likely finger sprain.  Finger splint and care instruction given.  Hand referral given.         Fayrene Helper, PA-C 11/08/11 2144

## 2011-11-08 NOTE — ED Notes (Signed)
Patient states that she was assisting her husband with some work and paneling fell onto her right hand - bending her finger back.

## 2011-11-08 NOTE — ED Provider Notes (Signed)
Medical screening examination/treatment/procedure(s) were performed by non-physician practitioner and as supervising physician I was immediately available for consultation/collaboration.  Baylin Gamblin R. Yehuda Printup, MD 11/08/11 2352 

## 2011-11-08 NOTE — Discharge Instructions (Signed)
Finger Sprain  A finger sprain happens when the bands of tissue that hold the finger bones together (ligaments) stretch too much and tear.  HOME CARE   Keep your injured finger raised (elevated) when possible.   Put ice on the injured area, twice a day, for 2 to 3 days.   Put ice in a plastic bag.   Place a towel between your skin and the bag.   Leave the ice on for 15 minutes.   Only take medicine as told by your doctor.   Do not wear rings on the injured finger.   Protect your finger until pain and stiffness go away (usually 3 to 4 weeks).   Do not get your cast or splint to get wet. Cover your cast or splint with a plastic bag when you shower or bathe. Do not swim.   Your doctor may suggest special exercises for you to do. These exercises will help keep or stop stiffness from happening.  GET HELP RIGHT AWAY IF:   Your cast or splint gets damaged.   Your pain gets worse, not better.  MAKE SURE YOU:   Understand these instructions.   Will watch your condition.   Will get help right away if you are not doing well or get worse.  Document Released: 06/09/2010 Document Revised: 04/26/2011 Document Reviewed: 01/08/2011  [employer] Patient Information 2012 [employer].

## 2011-12-14 ENCOUNTER — Other Ambulatory Visit: Payer: Self-pay | Admitting: Surgical

## 2012-02-05 ENCOUNTER — Ambulatory Visit: Payer: Federal, State, Local not specified - PPO

## 2012-02-05 ENCOUNTER — Ambulatory Visit (INDEPENDENT_AMBULATORY_CARE_PROVIDER_SITE_OTHER): Payer: Federal, State, Local not specified - PPO | Admitting: Emergency Medicine

## 2012-02-05 VITALS — BP 119/75 | HR 73 | Temp 97.7°F | Resp 18 | Ht 65.5 in | Wt 178.2 lb

## 2012-02-05 DIAGNOSIS — M25579 Pain in unspecified ankle and joints of unspecified foot: Secondary | ICD-10-CM

## 2012-02-05 DIAGNOSIS — M109 Gout, unspecified: Secondary | ICD-10-CM

## 2012-02-05 MED ORDER — COLCHICINE 0.6 MG PO TABS: 0.6000 mg | ORAL_TABLET | Freq: Every day | ORAL | Status: DC

## 2012-02-05 MED ORDER — INDOMETHACIN 50 MG PO CAPS: 50.0000 mg | ORAL_CAPSULE | Freq: Two times a day (BID) | ORAL | Status: DC

## 2012-02-05 NOTE — Progress Notes (Signed)
  Date:  02/05/2012   Name:  Christine Bishop   DOB:  20-Nov-1965   MRN:  [id number] Gender: female Age: 46 y.o.  PCP:  No primary provider on file.    Chief Complaint: Ankle Pain   History of Present Illness:  Christine Bishop is a 46 y.o. pleasant patient who presents with the following:  No history of injury or overuse.  Has a week long history of increasing pain and swelling in ankle, worse on medial aspect.  Interferes with work, ambulation and prolonged standing.  No history of injury or antecedent infection, fever or chills.  No history of gout or inflammatory joint disease.   Only risk factor for DVT is use of OCP.  Non smoker, no prolonged immobilization or travel.  Patient Active Problem List  Diagnosis  . MIGRAINE HEADACHE    Past Medical History  Diagnosis Date  . History of migraine headaches     Past Surgical History  Procedure Date  . Cholecystectomy open   . Leep   . Lasik     History  Substance Use Topics  . Smoking status: Former Smoker    Quit date: 06/07/2011  . Smokeless tobacco: Not on file  . Alcohol Use: No    No family history on file.  Allergies  Allergen Reactions  . Sulfamethoxazole W-Trimethoprim     REACTION: nausea; vomiting    Medication list has been reviewed and updated.  Outpatient Prescriptions Prior to Visit  Medication Sig Dispense Refill  . ibuprofen (ADVIL,MOTRIN) 200 MG tablet Take 200 mg by mouth every 8 (eight) hours as needed. For pain      . norethindrone-ethinyl estradiol (JUNEL FE,GILDESS FE,LOESTRIN FE) 1-20 MG-MCG tablet Take 1 tablet by mouth daily.      . SUMAtriptan (IMITREX) 100 MG tablet Take 100 mg by mouth as needed. For migraines.        Review of Systems:  As per HPI, otherwise negative.    Physical Examination: Filed Vitals:   02/05/12 1833  BP: 119/75  Pulse: 73  Temp: 97.7 F (36.5 C)  Resp: 18   Filed Vitals:   02/05/12 1833  Height: 5' 5.5" (1.664 m)  Weight: 178 lb 3.2 oz  (80.831 kg)   Body mass index is 29.20 kg/(m^2). Ideal Body Weight: Weight in (lb) to have BMI = 25: 152.2    GEN: WDWN, NAD, Non-toxic, Alert & Oriented x 3 HEENT: Atraumatic, Normocephalic.  Ears and Nose: No external deformity. EXTR: No clubbing/cyanosis/edema NEURO: Normal gait.  PSYCH: Normally interactive. Conversant. Not depressed or anxious appearing.  Calm demeanor.  ANKLE:  Left ankle swollen and tender in joint. No erythema or cellulitis or lymphangitis.  NATI  Assessment and Plan: Gout Indocin colcrys Follow up as needed  Carmelina Dane, MD  [facility] reading (PRIMARY) by  Dr. Dareen Piano.  Negative .  I have reviewed and agree with documentation. Robert P. Merla Riches, M.D.

## 2012-04-10 ENCOUNTER — Other Ambulatory Visit (HOSPITAL_COMMUNITY): Payer: Self-pay | Admitting: Obstetrics and Gynecology

## 2012-04-10 DIAGNOSIS — Z1231 Encounter for screening mammogram for malignant neoplasm of breast: Secondary | ICD-10-CM

## 2012-05-02 ENCOUNTER — Ambulatory Visit (HOSPITAL_COMMUNITY)
Admission: RE | Admit: 2012-05-02 | Discharge: 2012-05-02 | Disposition: A | Discharge status: Home / Self Care | Payer: Federal, State, Local not specified - PPO | Source: Ambulatory Visit | Attending: Obstetrics and Gynecology | Admitting: Obstetrics and Gynecology

## 2012-05-02 DIAGNOSIS — Z1231 Encounter for screening mammogram for malignant neoplasm of breast: Secondary | ICD-10-CM | POA: Insufficient documentation

## 2013-08-10 ENCOUNTER — Other Ambulatory Visit: Payer: Self-pay | Admitting: Family Medicine

## 2013-08-10 DIAGNOSIS — R51 Headache: Principal | ICD-10-CM

## 2013-08-10 DIAGNOSIS — R519 Headache, unspecified: Secondary | ICD-10-CM

## 2013-08-11 ENCOUNTER — Other Ambulatory Visit: Payer: Federal, State, Local not specified - PPO

## 2013-08-17 ENCOUNTER — Ambulatory Visit
Admission: RE | Admit: 2013-08-17 | Discharge: 2013-08-17 | Disposition: A | Discharge status: Home / Self Care | Payer: Federal, State, Local not specified - PPO | Source: Ambulatory Visit | Attending: Family Medicine | Admitting: Family Medicine

## 2013-08-17 DIAGNOSIS — R51 Headache: Principal | ICD-10-CM

## 2013-08-17 DIAGNOSIS — R519 Headache, unspecified: Secondary | ICD-10-CM

## 2013-10-06 ENCOUNTER — Ambulatory Visit: Payer: Federal, State, Local not specified - PPO

## 2013-10-06 ENCOUNTER — Ambulatory Visit (INDEPENDENT_AMBULATORY_CARE_PROVIDER_SITE_OTHER): Payer: Federal, State, Local not specified - PPO | Admitting: Internal Medicine

## 2013-10-06 VITALS — BP 102/72 | HR 84 | Temp 98.1°F | Resp 16 | Ht 66.0 in | Wt 178.0 lb

## 2013-10-06 DIAGNOSIS — M79673 Pain in unspecified foot: Secondary | ICD-10-CM

## 2013-10-06 DIAGNOSIS — M79609 Pain in unspecified limb: Secondary | ICD-10-CM

## 2013-10-06 DIAGNOSIS — M109 Gout, unspecified: Secondary | ICD-10-CM

## 2013-10-06 LAB — POCT SEDIMENTATION RATE: POCT SED RATE: 30 mm/hr — AB (ref 0–22)

## 2013-10-06 LAB — POCT CBC
Granulocyte percent: 64.3 %G (ref 37–80)
HEMATOCRIT: 39 % (ref 37.7–47.9)
Hemoglobin: 11.9 g/dL — AB (ref 12.2–16.2)
Lymph, poc: 1.3 (ref 0.6–3.4)
MCH, POC: 28.3 pg (ref 27–31.2)
MCHC: 30.5 g/dL — AB (ref 31.8–35.4)
MCV: 92.6 fL (ref 80–97)
MID (CBC): 0.3 (ref 0–0.9)
MPV: 10.1 fL (ref 0–99.8)
POC Granulocyte: 2.8 (ref 2–6.9)
POC LYMPH PERCENT: 28.9 %L (ref 10–50)
POC MID %: 6.8 % (ref 0–12)
Platelet Count, POC: 220 10*3/uL (ref 142–424)
RBC: 4.21 M/uL (ref 4.04–5.48)
RDW, POC: 13.6 %
WBC: 4.4 10*3/uL — AB (ref 4.6–10.2)

## 2013-10-06 LAB — COMPREHENSIVE METABOLIC PANEL
ALBUMIN: 4.1 g/dL (ref 3.5–5.2)
ALK PHOS: 59 U/L (ref 39–117)
ALT: <8 U/L (ref 0–35)
AST: 12 U/L (ref 0–37)
BILIRUBIN TOTAL: 0.3 mg/dL (ref 0.2–1.2)
BUN: 14 mg/dL (ref 6–23)
CO2: 23 meq/L (ref 19–32)
CREATININE: 0.79 mg/dL (ref 0.50–1.10)
Calcium: 9 mg/dL (ref 8.4–10.5)
Chloride: 107 mEq/L (ref 96–112)
Glucose, Bld: 83 mg/dL (ref 70–99)
POTASSIUM: 4.5 meq/L (ref 3.5–5.3)
Sodium: 140 mEq/L (ref 135–145)
TOTAL PROTEIN: 6.9 g/dL (ref 6.0–8.3)

## 2013-10-06 LAB — URIC ACID: Uric Acid, Serum: 4.2 mg/dL (ref 2.4–7.0)

## 2013-10-06 LAB — TSH: TSH: 2.929 u[IU]/mL (ref 0.350–4.500)

## 2013-10-06 MED ORDER — PREDNISONE 10 MG PO TABS: ORAL_TABLET | ORAL | Status: DC

## 2013-10-06 MED ORDER — HYDROCODONE-ACETAMINOPHEN 5-325 MG PO TABS: 1.0000 | ORAL_TABLET | Freq: Four times a day (QID) | ORAL | Status: DC | PRN

## 2013-10-06 NOTE — Patient Instructions (Addendum)
Gout Gout is an inflammatory arthritis caused by a buildup of uric acid crystals in the joints. Uric acid is a chemical that is normally present in the blood. When the level of uric acid in the blood is too high it can form crystals that deposit in your joints and tissues. This causes joint redness, soreness, and swelling (inflammation). Repeat attacks are common. Over time, uric acid crystals can form into masses (tophi) near a joint, destroying bone and causing disfigurement. Gout is treatable and often preventable. CAUSES  The disease begins with elevated levels of uric acid in the blood. Uric acid is produced by your body when it breaks down a naturally found substance called purines. Certain foods you eat, such as meats and fish, contain high amounts of purines. Causes of an elevated uric acid level include:  Being passed down from parent to child (heredity).  Diseases that cause increased uric acid production (such as obesity, psoriasis, and certain cancers).  Excessive alcohol use.  Diet, especially diets rich in meat and seafood.  Medicines, including certain cancer-fighting medicines (chemotherapy), water pills (diuretics), and aspirin.  Chronic kidney disease. The kidneys are no longer able to remove uric acid well.  Problems with metabolism. Conditions strongly associated with gout include:  Obesity.  High blood pressure.  High cholesterol.  Diabetes. Not everyone with elevated uric acid levels gets gout. It is not understood why some people get gout and others do not. Surgery, joint injury, and eating too much of certain foods are some of the factors that can lead to gout attacks. SYMPTOMS   An attack of gout comes on quickly. It causes intense pain with redness, swelling, and warmth in a joint.  Fever can occur.  Often, only one joint is involved. Certain joints are more commonly involved:  Base of the big toe.  Knee.  Ankle.  Wrist.  Finger. Without  treatment, an attack usually goes away in a few days to weeks. Between attacks, you usually will not have symptoms, which is different from many other forms of arthritis. DIAGNOSIS  Your caregiver will suspect gout based on your symptoms and exam. In some cases, tests may be recommended. The tests may include:  Blood tests.  Urine tests.  X-rays.  Joint fluid exam. This exam requires a needle to remove fluid from the joint (arthrocentesis). Using a microscope, gout is confirmed when uric acid crystals are seen in the joint fluid. TREATMENT  There are two phases to gout treatment: treating the sudden onset (acute) attack and preventing attacks (prophylaxis).  Treatment of an Acute Attack.  Medicines are used. These include anti-inflammatory medicines or steroid medicines.  An injection of steroid medicine into the affected joint is sometimes necessary.  The painful joint is rested. Movement can worsen the arthritis.  You may use warm or cold treatments on painful joints, depending which works best for you.  Treatment to Prevent Attacks.  If you suffer from frequent gout attacks, your caregiver may advise preventive medicine. These medicines are started after the acute attack subsides. These medicines either help your kidneys eliminate uric acid from your body or decrease your uric acid production. You may need to stay on these medicines for a very long time.  The early phase of treatment with preventive medicine can be associated with an increase in acute gout attacks. For this reason, during the first few months of treatment, your caregiver may also advise you to take medicines usually used for acute gout treatment. Be sure you   understand your caregiver's directions. Your caregiver may make several adjustments to your medicine dose before these medicines are effective.  Discuss dietary treatment with your caregiver or dietitian. Alcohol and drinks high in sugar and fructose and foods  such as meat, poultry, and seafood can increase uric acid levels. Your caregiver or dietician can advise you on drinks and foods that should be limited. HOME CARE INSTRUCTIONS   Do not take aspirin to relieve pain. This raises uric acid levels.  Only take over-the-counter or prescription medicines for pain, discomfort, or fever as directed by your caregiver.  Rest the joint as much as possible. When in bed, keep sheets and blankets off painful areas.  Keep the affected joint raised (elevated).  Apply warm or cold treatments to painful joints. Use of warm or cold treatments depends on which works best for you.  Use crutches if the painful joint is in your leg.  Drink enough fluids to keep your urine clear or pale yellow. This helps your body get rid of uric acid. Limit alcohol, sugary drinks, and fructose drinks.  Follow your dietary instructions. Pay careful attention to the amount of protein you eat. Your daily diet should emphasize fruits, vegetables, whole grains, and fat-free or low-fat milk products. Discuss the use of coffee, vitamin C, and cherries with your caregiver or dietician. These may be helpful in lowering uric acid levels.  Maintain a healthy body weight. SEEK MEDICAL CARE IF:   You develop diarrhea, vomiting, or any side effects from medicines.  You do not feel better in 24 hours, or you are getting worse. SEEK IMMEDIATE MEDICAL CARE IF:   Your joint becomes suddenly more tender, and you have chills or a fever. MAKE SURE YOU:   Understand these instructions.  Will watch your condition.  Will get help right away if you are not doing well or get worse. Document Released: 05/04/2000 Document Revised: 09/01/2012 Document Reviewed: 12/19/2011 [facility] Patient Information 2014 [place]. Uric Acid Nephropathy Uric acid nephropathy is a condition of kidney damage that occurs when there is too much uric acid in the body. When the uric acid is too high, crystals  can form in the tissues of the body. When this happens in the kidneys, it is called gouty or uric acid nephropathy.  Uric acid comes from the breakdown of purines in your diet. If too much uric acid is made, or your body does not efficiently clear uric acid from your body, then this can lead to too much uric acid in your blood. The whole system can also get very overworked. In those cases, kidney damage occurs; but the disease does not always cause high uric acid levels in the blood. The disease does not always cause high uric acid levels in the blood. CAUSES Most cases of uric acid nephropathy happen during the treatment of cancer. This is because of the increase in uric acid production during chemotherapy. Uric Acid Nephropathy happens frequently in people with gout. Some more common causes of high uric acid in the body include: Overproduction of urate Breakdown of blood. Lymphomas and leukemias. Polycythemia vera. Psoriasis (severe). Paget's disease. Muscle breakdown. Exercise. Alcohol. Obesity. Purine-rich diet. Decreased excretion of uric acid Primary idiopathic hyperuricemia. Kidney disease. Diabetes insipidus. High blood pressure. Acidosis. Down syndrome. Starvation. Sarcoidosis. Lead intake. Hyperparathyroidism. Hypothyroidism. Toxemia of Pregnancy. Drug ingestion. Aspirin (less than 2 g per day). Water pills (diuretics). Alcohol. Sinemet. Nicotinic acid. Combined mechanism. Glucose-6-phosphate dehydrogenase deficiency. Alcohol. SYMPTOMS When uric acid nephropathy leads to  sudden kidney failure, symptoms can include: Nausea and vomiting. Lethargy and seizures. Complete kidney failure with the inability to pass your water or urinating less. If stones form in the kidneys or ureters, some of the problems may include: Flank pain. Blood in the urine. Urinary frequency. Uncomfortable or painful urination. DIAGNOSIS The diagnosis is based on finding uric acid  crystals in joints, tissues or body fluids. Blood work is done to check on the uric acid levels but is not always abnormal. Kidney function tests and other blood work is also done. A urinalysis is usually performed and will show if uric acid crystals are found in the urine. The urine sample is usually normal. TREATMENT The treatment is aimed at stopping the immediate problems and preventing them from coming back. It is also for the prevention of complications which come from longstanding gout or high uric acid in the tissues. This is often done with medications. If the start of kidney failure has been sudden and more severe, dialysis may be used. Your caregiver may have you take a special diet which is low in purines. Just cutting out animal meats is a great help to cut down on your purine intake. Keep up a good fluid intake. You should drink enough water to put out 2 to 3 quarts or liters of urine per day. PREVENTION Prevention is done by using medications which decrease uric acid production or with medications to increase the rate of removal by the kidneys. Obesity, high purine diets, alcohol, certain medications and too much exercise can elevate the uric acid. SEEK IMMEDIATE MEDICAL CARE IF: You have been put on allopurinol and develop a rash. This is a medical emergency. You develop flank pain, urinate less or stop urinating. Your urine becomes smoky or bloody colored. You develop nausea or vomiting. You develop lethargy or seizures. Document Released: 03/04/2007 Document Revised: 07/30/2011 Document Reviewed: 03/04/2007 [facility] Patient Information 2014 [place].

## 2013-10-06 NOTE — Progress Notes (Signed)
   Subjective:    Patient ID: Christine Bishop, female    DOB: 08-Nov-1965, 48 y.o.   MRN: [id number]  HPI 48 year old female complains of right foot pain. She woke up 3 days ago with the pain but has had no injury that she knows of. On a scale of 1-10 she says her pain is about an 8.She was treated for gout about a year and a half ago. Thinks she is having the same symptoms. Never had blood work for gout and none in epic from past. No fever, minimal swelling, no red yet. The meds worked last time   Review of Systems     Objective:   Physical Exam  Vitals reviewed. Constitutional: She is oriented to person, place, and time. She appears well-developed and well-nourished. No distress.  HENT:  Head: Normocephalic.  Eyes: EOM are normal. No scleral icterus.  Neck: Normal range of motion. Neck supple.  Pulmonary/Chest: Effort normal.  Musculoskeletal: She exhibits edema and tenderness.       Right foot: She exhibits tenderness, bony tenderness and swelling. She exhibits normal range of motion, normal capillary refill, no crepitus and no deformity.       Feet:  No erythema  Neurological: She is alert and oriented to person, place, and time. She exhibits normal muscle tone. Coordination normal.  Skin: No rash noted.  Psychiatric: She has a normal mood and affect.   [facility] Policy for Prescribing Controlled Substances (Revised 03/2012) 1. Prescriptions for controlled substances will be filled by ONE provider at [facility] with whom you have established and developed a plan for your care, including follow-up. 2. You are encouraged to schedule an appointment with your prescriber at our appointment center for follow-up visits whenever possible. 3. If you request a prescription for the controlled substance while at [facility] for an acute problem (with someone other than your regular prescriber), you MAY be given a ONE-TIME prescription for a 30-day supply of the controlled substance, to allow time for you to  return to see your regular prescriber for additional prescriptions. [facility] reading (PRIMARY) by  Dr Elder Cyphers normal foot xr       Assessment & Plan:  Foot pain/Possible gout Prednisone taper/Camwalker/Vicodin

## 2013-10-09 ENCOUNTER — Encounter: Payer: Self-pay | Admitting: Internal Medicine

## 2013-10-10 ENCOUNTER — Encounter: Payer: Self-pay | Admitting: *Deleted

## 2013-10-11 ENCOUNTER — Encounter: Payer: Self-pay | Admitting: Radiology

## 2015-02-18 ENCOUNTER — Encounter: Payer: Self-pay | Admitting: Podiatry

## 2015-02-18 ENCOUNTER — Ambulatory Visit (INDEPENDENT_AMBULATORY_CARE_PROVIDER_SITE_OTHER): Payer: Federal, State, Local not specified - PPO | Admitting: Podiatry

## 2015-02-18 VITALS — BP 108/76 | HR 90 | Resp 16

## 2015-02-18 DIAGNOSIS — L603 Nail dystrophy: Secondary | ICD-10-CM

## 2015-02-18 DIAGNOSIS — L600 Ingrowing nail: Secondary | ICD-10-CM | POA: Diagnosis not present

## 2015-02-18 NOTE — Progress Notes (Signed)
   Subjective:    Patient ID: Christine Bishop, female    DOB: 09-18-1965, 49 y.o.   MRN: [id number]  HPI Comments: "I have discolored nails"  Patient presents with: Nail Problem: 1st toenail bilateral - thick, discolored nails for several years, right worse than left, injured the right years ago, she has been using Jublia and has not helped, also tried several home remedies-no better.       Review of Systems  Neurological: Positive for headaches.  All other systems reviewed and are negative.      Objective:   Physical Exam        Assessment & Plan:

## 2015-02-18 NOTE — Patient Instructions (Signed)

## 2015-02-18 NOTE — Progress Notes (Signed)
Subjective:     Patient ID: Christine Bishop, female   DOB: [birth year], 49 y.o.   MRN: [id number]  HPI patient states I've had problems with his right big toenail since I was 49 years old it's thick and I've tried topical medications without relief and it's turning black at times and it's painful. The left hallux has slight discoloration but not to the same degree   Review of Systems  All other systems reviewed and are negative.      Objective:   Physical Exam  Constitutional: She is oriented to person, place, and time.  Cardiovascular: Intact distal pulses.   Musculoskeletal: Normal range of motion.  Neurological: She is oriented to person, place, and time.  Skin: Skin is warm.  Nursing note and vitals reviewed.  neurovascular status intact muscle strength adequate range of motion within normal limits with patient found to have a thick damaged right hallux nail that upon palpation is painful from a dorsal direction. The left hallux nail has some slight distal irritation but nowhere near to the same degree. Patient's found to be well oriented with good digital perfusion     Assessment:     Damage right hallux nail with thickness and pain and mild distal mycotic nail infection left    Plan:     H&P and conditions reviewed with patient. I've recommended removal of the hallux nail right explaining the procedure and risk and she wants to have this performed. This will not returned and she understands this and today I infiltrated the right hallux 60 mg like Marcaine mixture removed the hallux nail exposed matrix and applied phenol 5 applications 30 seconds followed by alcohol lavage and sterile dressing area

## 2015-02-22 ENCOUNTER — Telehealth: Payer: Self-pay | Admitting: *Deleted

## 2015-02-22 NOTE — Telephone Encounter (Signed)
Called patient at [phone] (Cell #) to check to see how she was doing from her ingrown toenail procedure that was performed on Friday, February 18, 2015. Pt stated, "doing fine and toe looks good". Pt has had no problems. Pt is taking ibuprofen for pain in toe with relief.

## 2015-09-23 ENCOUNTER — Ambulatory Visit (INDEPENDENT_AMBULATORY_CARE_PROVIDER_SITE_OTHER): Payer: Federal, State, Local not specified - PPO | Admitting: Urgent Care

## 2015-09-23 VITALS — BP 118/82 | HR 75 | Temp 98.0°F | Resp 16 | Ht 66.0 in | Wt 146.0 lb

## 2015-09-23 DIAGNOSIS — L259 Unspecified contact dermatitis, unspecified cause: Secondary | ICD-10-CM | POA: Diagnosis not present

## 2015-09-23 DIAGNOSIS — W57XXXA Bitten or stung by nonvenomous insect and other nonvenomous arthropods, initial encounter: Secondary | ICD-10-CM | POA: Diagnosis not present

## 2015-09-23 MED ORDER — DOXYCYCLINE HYCLATE 100 MG PO CAPS: 100.0000 mg | ORAL_CAPSULE | Freq: Two times a day (BID) | ORAL | Status: DC

## 2015-09-23 NOTE — Patient Instructions (Addendum)
Take Zyrtec, Allegra or Claritin with Pepcid or Zantac for allergic reaction to tick bite. If you have no improvement, the rash worsens or you get symptoms listed below, start the antibiotic. Return to our clinic once you are done with the antibiotic or if you still have no improvement after 3-4 days of being on the antibiotic.    Tick Bite Information Ticks are insects that attach themselves to the skin and draw blood for food. There are various types of ticks. Common types include wood ticks and deer ticks. Most ticks live in shrubs and grassy areas. Ticks can climb onto your body when you make contact with leaves or grass where the tick is waiting. The most common places on the body for ticks to attach themselves are the scalp, neck, armpits, waist, and groin. Most tick bites are harmless, but sometimes ticks carry germs that cause diseases. These germs can be spread to a person during the tick's feeding process. The chance of a disease spreading through a tick bite depends on:   The type of tick.  Time of year.   How long the tick is attached.   Geographic location.  HOW CAN YOU PREVENT TICK BITES? Take these steps to help prevent tick bites when you are outdoors:  Wear protective clothing. Long sleeves and long pants are best.   Wear white clothes so you can see ticks more easily.  Tuck your pant legs into your socks.   If walking on a trail, stay in the middle of the trail to avoid brushing against bushes.  Avoid walking through areas with long grass.  Put insect repellent on all exposed skin and along boot tops, pant legs, and sleeve cuffs.   Check clothing, hair, and skin repeatedly and before going inside.   Brush off any ticks that are not attached.  Take a shower or bath as soon as possible after being outdoors.  WHAT IS THE PROPER WAY TO REMOVE A TICK? Ticks should be removed as soon as possible to help prevent diseases caused by tick bites. 1. If latex  gloves are available, put them on before trying to remove a tick.  2. Using fine-point tweezers, grasp the tick as close to the skin as possible. You may also use curved forceps or a tick removal tool. Grasp the tick as close to its head as possible. Avoid grasping the tick on its body. 3. Pull gently with steady upward pressure until the tick lets go. Do not twist the tick or jerk it suddenly. This may break off the tick's head or mouth parts. 4. Do not squeeze or crush the tick's body. This could force disease-carrying fluids from the tick into your body.  5. After the tick is removed, wash the bite area and your hands with soap and water or other disinfectant such as alcohol. 6. Apply a small amount of antiseptic cream or ointment to the bite site.  7. Wash and disinfect any instruments that were used.  Do not try to remove a tick by applying a hot match, petroleum jelly, or fingernail polish to the tick. These methods do not work and may increase the chances of disease being spread from the tick bite.  WHEN SHOULD YOU SEEK MEDICAL CARE? Contact your health care provider if you are unable to remove a tick from your skin or if a part of the tick breaks off and is stuck in the skin.  After a tick bite, you need to be aware of  signs and symptoms that could be related to diseases spread by ticks. Contact your health care provider if you develop any of the following in the days or weeks after the tick bite:  Unexplained fever.  Rash. A circular rash that appears days or weeks after the tick bite may indicate the possibility of Lyme disease. The rash may resemble a target with a bull's-eye and may occur at a different part of your body than the tick bite.  Redness and swelling in the area of the tick bite.   Tender, swollen lymph glands.   Diarrhea.   Weight loss.   Cough.   Fatigue.   Muscle, joint, or bone pain.   Abdominal pain.   Headache.   Lethargy or a change in  your level of consciousness.  Difficulty walking or moving your legs.   Numbness in the legs.   Paralysis.  Shortness of breath.   Confusion.   Repeated vomiting.    This information is not intended to replace advice given to you by your health care provider. Make sure you discuss any questions you have with your health care provider.   Document Released: 05/04/2000 Document Revised: 05/28/2014 Document Reviewed: 10/15/2012 Elsevier Interactive Patient Education 2016 [employer].     IF you received an x-ray today, you will receive an invoice from [facility]. Please contact [facility] at [phone] with questions or concerns regarding your invoice.   IF you received labwork today, you will receive an invoice from [employer]. Please contact [facility] at [phone] with questions or concerns regarding your invoice.   Our billing staff will not be able to assist you with questions regarding bills from these companies.  You will be contacted with the lab results as soon as they are available. The fastest way to get your results is to activate your My Chart account. Instructions are located on the last page of this paperwork. If you have not heard from [place] regarding the results in 2 weeks, please contact this office.

## 2015-09-23 NOTE — Progress Notes (Signed)
    MRN: [id number] DOB: 1965-08-17  Subjective:   Christine Bishop is a 50 y.o. female presenting for chief complaint of Insect Bite  Reports 2 day history of tick bite over right shoulder. Patient's daughter removed the tick. She has since had local redness, mild pain. She has not tried any medications for relief. Patient's concern is that the tick was not removed in its entirety. Denies fever, conjunctivitis, cough, sore throat, chest pain, n/v, abdominal pain, rashes, lymph node pain.   Christine Bishop has a current medication list which includes the following prescription(s): cambia, citalopram, diazepam, levonorgestrel-ethinyl estradiol, sumatriptan, zonisamide, efinaconazole, and ibuprofen. Also is allergic to sulfamethoxazole-trimethoprim and topamax.  Christine Bishop  has a past medical history of History of migraine headaches and Anxiety. Also  has past surgical history that includes Cholecystectomy open; LEEP; and LASIK.  Objective:   Vitals: BP 118/82 mmHg  Pulse 75  Temp(Src) 98 F (36.7 C) (Oral)  Resp 16  Ht 5\' 6"  (1.676 m)  Wt 146 lb (66.225 kg)  BMI 23.58 kg/m2  SpO2 97%  LMP 09/09/2015 (Approximate)  Physical Exam  Constitutional: She is oriented to person, place, and time. She appears well-developed and well-nourished.  HENT:  Mouth/Throat: Oropharynx is clear and moist.  Eyes: Conjunctivae are normal.  Neck: Normal range of motion. Neck supple.  Cardiovascular: Normal rate, regular rhythm and intact distal pulses.  Exam reveals no gallop and no friction rub.   No murmur heard. Pulmonary/Chest: No respiratory distress. She has no wheezes. She has no rales.  Lymphadenopathy:    She has no cervical adenopathy.  Neurological: She is alert and oriented to person, place, and time.  Skin: Skin is warm and dry. There is erythema (over area depicted with surrounding induration but no tenderness or areas of fluctuance).     Assessment and Plan :   1. Tick bite 2. Contact  dermatitis - Counseled patient on contact dermatitis. She is to start 2 oral antihistamines. If her rash worsens, she is to start doxycycline. RTC in 1 week if no improvement still.  Jaynee Eagles, PA-C [facility] [phone] 09/23/2015 6:00 PM

## 2015-09-26 NOTE — Progress Notes (Signed)
Medical screening examination/treatment/procedure(s) were performed by a resident physician or non-physician practitioner and as the supervising physician I was immediately available for consultation/collaboration.  Lynne Leader, MD

## 2016-03-01 ENCOUNTER — Ambulatory Visit (INDEPENDENT_AMBULATORY_CARE_PROVIDER_SITE_OTHER): Payer: Federal, State, Local not specified - PPO | Admitting: Physician Assistant

## 2016-03-01 VITALS — BP 102/72 | HR 81 | Temp 98.2°F | Resp 17 | Ht 66.0 in | Wt 150.0 lb

## 2016-03-01 DIAGNOSIS — H9202 Otalgia, left ear: Secondary | ICD-10-CM

## 2016-03-01 NOTE — Patient Instructions (Signed)
     IF you received an x-ray today, you will receive an invoice from [facility]. Please contact [facility] at [phone] with questions or concerns regarding your invoice.   IF you received labwork today, you will receive an invoice from [facility]. Please contact [facility] at [phone] with questions or concerns regarding your invoice.   Our billing staff will not be able to assist you with questions regarding bills from these companies.  You will be contacted with the lab results as soon as they are available. The fastest way to get your results is to activate your My Chart account. Instructions are located on the last page of this paperwork. If you have not heard from us regarding the results in 2 weeks, please contact this office.      

## 2016-03-01 NOTE — Progress Notes (Signed)
Christine Bishop  MRN: [id number] DOB: 1966-03-17  PCP: Tamsen Roers, MD  Subjective:  Pt is a 50 year old female who presents to clinic for infected earring in left ear. She got the piercing in November to help prevent migrains. She noticed pain a week ago and states it has been draining for two days. Her daughter has been checking it daily and says the drainage has turned brown. She is afraid of infection and would like antibiotics.  She sleeps on her left side. Has been going to bed with wet hair  In the fast few weeks.  Was using peroxide and antibiotic ointment.  Denies fever, chills, decreased hearing.  Review of Systems  Constitutional: Negative for chills, fatigue and fever.  HENT: Positive for ear discharge and ear pain.   Cardiovascular: Negative for chest pain and palpitations.  Gastrointestinal: Negative for diarrhea, nausea and vomiting.  Skin: Positive for wound. Negative for color change.    Patient Active Problem List   Diagnosis Date Noted  . MIGRAINE HEADACHE 08/06/2010    Current Outpatient Prescriptions on File Prior to Visit  Medication Sig Dispense Refill  . CAMBIA 50 MG PACK Reported on 09/23/2015    . diazepam (VALIUM) 5 MG tablet Take 5 mg by mouth every 6 (six) hours as needed for anxiety.    Marland Kitchen ibuprofen (ADVIL,MOTRIN) 200 MG tablet Take 200 mg by mouth every 8 (eight) hours as needed. Reported on 09/23/2015    . levonorgestrel-ethinyl estradiol (ORSYTHIA) 0.1-20 MG-MCG tablet     . SUMAtriptan 6 MG/0.5ML SOAJ INJECT AT ONSET OF HEADACHE, MAY REPEAT IN 2 HOURS IF NEEDED BUT NO MORE THAN 2 IN 24 HOURS  6  . zonisamide (ZONEGRAN) 100 MG capsule TAKE 1CAPSULE BY MOUTH AT BEDTIME FOR 1 WEEK, THEN TAKE 2 CAPSULES EVERY NIGHT  6   No current facility-administered medications on file prior to visit.     Allergies  Allergen Reactions  . Sulfamethoxazole-Trimethoprim     REACTION: nausea; vomiting  . Topamax [Topiramate] Anxiety    Objective:  BP 102/72 (BP  Location: Right Arm, Patient Position: Sitting, Cuff Size: Normal)   Pulse 81   Temp 98.2 F (36.8 C) (Oral)   Resp 17   Ht 5\' 6"  (1.676 m)   Wt 150 lb (68 kg)   LMP 02/09/2016   SpO2 99%   BMI 24.21 kg/m   Physical Exam  Constitutional: She is oriented to person, place, and time and well-developed, well-nourished, and in no distress. No distress.  HENT:  Piercing through left helix. Mild erythema at sight of  Piercing only. No streaking, swelling, active drainage noted. Crusting appreciated surrounding piercing.  TTP.  Cardiovascular: Normal rate, regular rhythm and normal heart sounds.   Pulmonary/Chest: Effort normal. No respiratory distress.  Neurological: She is alert and oriented to person, place, and time. GCS score is 15.  Skin: Skin is warm and dry.  Psychiatric: Mood, memory, affect and judgment normal.  Vitals reviewed.   Assessment and Plan :  1. Ear pain, left - Supportive care: Discussed with patient removal of earring is definitive treatment. She is reluctant to remove it. Discussed with patient allow it 24-48 hours to heal: She should not sleep on her left side, do not go to bed with wet hair. Clean with soap and water and allow it to dry completely.  If it does not improve or it becomes more painful, remove immediately. She understands. RTC if symptoms worsen or continue after she removes  piercing.    Mercer Pod, PA-C  [facility] 03/01/2016 5:23 PM

## 2017-01-25 MED FILL — SHINGRIX 50 MCG SUS: 50 | 1 days supply | Qty: 1 | Fill #0

## 2017-04-01 MED FILL — SHINGRIX 50 MCG SUS: 50 | 1 days supply | Qty: 1 | Fill #0

## 2017-07-29 MED FILL — ONDANSETRON ODT 8 MG TABLET: 8 | 10 days supply | Qty: 30 | Fill #0

## 2017-07-29 MED FILL — TOPIRAMATE ER 100 MG CAP: 100 | 90 days supply | Qty: 90 | Fill #0

## 2017-07-29 MED FILL — SUMATRIPTAN SUCC 100 MG TAB: 100 | 30 days supply | Qty: 9 | Fill #0

## 2017-08-20 ENCOUNTER — Other Ambulatory Visit: Payer: Self-pay | Admitting: Gastroenterology

## 2017-08-20 ENCOUNTER — Ambulatory Visit
Admission: RE | Admit: 2017-08-20 | Discharge: 2017-08-20 | Disposition: A | Discharge status: Home / Self Care | Payer: Federal, State, Local not specified - PPO | Source: Ambulatory Visit | Attending: Gastroenterology | Admitting: Gastroenterology

## 2017-08-20 DIAGNOSIS — R1084 Generalized abdominal pain: Secondary | ICD-10-CM

## 2017-08-20 MED ORDER — IOPAMIDOL (ISOVUE-300) INJECTION 61%
100.0000 mL | Freq: Once | INTRAVENOUS | Status: AC | PRN
  Administered 2017-08-20: 100 mL via INTRAVENOUS

## 2017-09-03 MED FILL — SUMATRIPTAN SUCC 100 MG TAB: 100 | 30 days supply | Qty: 9 | Fill #1 | Status: TO

## 2018-03-25 ENCOUNTER — Other Ambulatory Visit: Payer: Self-pay | Admitting: General Practice

## 2018-03-25 ENCOUNTER — Other Ambulatory Visit (HOSPITAL_COMMUNITY): Payer: Self-pay | Admitting: General Practice

## 2018-03-25 DIAGNOSIS — R202 Paresthesia of skin: Secondary | ICD-10-CM

## 2018-03-27 ENCOUNTER — Ambulatory Visit (HOSPITAL_COMMUNITY)
Admission: RE | Admit: 2018-03-27 | Discharge: 2018-03-27 | Disposition: A | Discharge status: Home / Self Care | Payer: Federal, State, Local not specified - PPO | Source: Ambulatory Visit | Attending: General Practice | Admitting: General Practice

## 2018-03-27 DIAGNOSIS — R202 Paresthesia of skin: Secondary | ICD-10-CM | POA: Diagnosis present

## 2018-03-27 DIAGNOSIS — I6782 Cerebral ischemia: Secondary | ICD-10-CM | POA: Insufficient documentation

## 2018-03-27 MED ORDER — GADOBUTROL 1 MMOL/ML IV SOLN
6.0000 mL | Freq: Once | INTRAVENOUS | Status: AC | PRN
  Administered 2018-03-27: 6 mL via INTRAVENOUS

## 2018-04-08 ENCOUNTER — Other Ambulatory Visit (HOSPITAL_COMMUNITY): Payer: Self-pay | Admitting: General Practice

## 2018-04-08 DIAGNOSIS — R9089 Other abnormal findings on diagnostic imaging of central nervous system: Secondary | ICD-10-CM

## 2018-04-22 ENCOUNTER — Ambulatory Visit (HOSPITAL_COMMUNITY)
Admission: RE | Admit: 2018-04-22 | Discharge: 2018-04-22 | Disposition: A | Discharge status: Home / Self Care | Payer: Federal, State, Local not specified - PPO | Source: Ambulatory Visit | Attending: General Practice | Admitting: General Practice

## 2018-04-22 DIAGNOSIS — R9089 Other abnormal findings on diagnostic imaging of central nervous system: Secondary | ICD-10-CM | POA: Insufficient documentation

## 2018-04-22 LAB — CSF CELL COUNT WITH DIFFERENTIAL
RBC Count, CSF: 6 /mm3 — ABNORMAL HIGH
TUBE #: 4
WBC, CSF: 2 /mm3 (ref 0–5)

## 2018-04-22 LAB — GLUCOSE, CSF: Glucose, CSF: 59 mg/dL (ref 40–70)

## 2018-04-22 LAB — PROTEIN, CSF: Total  Protein, CSF: 20 mg/dL (ref 15–45)

## 2018-04-22 MED ORDER — LIDOCAINE HCL 1 % IJ SOLN
INTRAMUSCULAR | Status: AC
  Filled 2018-04-22: qty 20

## 2018-04-22 NOTE — Procedures (Signed)
Procedure: diagnostic LP with Fluoro Guidance. Specimen: CSF, to lab Bleeding: minimal. Complications: None immediate. Patient   -Condition: Stable.  -Disposition:  To recovery and then d/c to home.  Full Radiology Report to follow under IMAGING

## 2018-04-24 ENCOUNTER — Other Ambulatory Visit: Payer: Self-pay | Admitting: General Practice

## 2018-04-24 ENCOUNTER — Ambulatory Visit
Admission: RE | Admit: 2018-04-24 | Discharge: 2018-04-24 | Disposition: A | Discharge status: Home / Self Care | Payer: Federal, State, Local not specified - PPO | Source: Ambulatory Visit | Attending: General Practice | Admitting: General Practice

## 2018-04-24 DIAGNOSIS — G971 Other reaction to spinal and lumbar puncture: Secondary | ICD-10-CM

## 2018-04-24 MED ORDER — IOPAMIDOL (ISOVUE-M 200) INJECTION 41%
1.0000 mL | Freq: Once | INTRAMUSCULAR | Status: AC
  Administered 2018-04-24: 1 mL via EPIDURAL

## 2018-04-24 NOTE — Discharge Instructions (Signed)

## 2018-04-24 NOTE — Progress Notes (Signed)
Blood obtained from pt's R AC for blood patch. Pt tolerated procedure well. Site is unremarkable. 

## 2018-05-22 ENCOUNTER — Other Ambulatory Visit: Payer: Self-pay | Admitting: General Practice

## 2018-05-22 DIAGNOSIS — R9089 Other abnormal findings on diagnostic imaging of central nervous system: Secondary | ICD-10-CM

## 2018-05-27 ENCOUNTER — Other Ambulatory Visit (HOSPITAL_COMMUNITY): Payer: Self-pay | Admitting: General Practice

## 2018-05-27 DIAGNOSIS — R9089 Other abnormal findings on diagnostic imaging of central nervous system: Secondary | ICD-10-CM

## 2018-06-23 ENCOUNTER — Ambulatory Visit (HOSPITAL_COMMUNITY)
Admission: RE | Admit: 2018-06-23 | Discharge: 2018-06-23 | Disposition: A | Discharge status: Home / Self Care | Payer: Federal, State, Local not specified - PPO | Source: Ambulatory Visit | Attending: General Practice | Admitting: General Practice

## 2018-06-23 DIAGNOSIS — R9089 Other abnormal findings on diagnostic imaging of central nervous system: Secondary | ICD-10-CM | POA: Insufficient documentation

## 2018-06-23 LAB — CSF CELL COUNT WITH DIFFERENTIAL
RBC Count, CSF: 2 /mm3 — ABNORMAL HIGH
Tube #: 4
WBC, CSF: 3 /mm3 (ref 0–5)

## 2018-06-23 LAB — PROTEIN, CSF: TOTAL PROTEIN, CSF: 25 mg/dL (ref 15–45)

## 2018-06-23 LAB — GLUCOSE, CSF: Glucose, CSF: 55 mg/dL (ref 40–70)

## 2018-06-23 MED ORDER — LIDOCAINE HCL 1 % IJ SOLN
INTRAMUSCULAR | Status: AC
  Filled 2018-06-23: qty 20

## 2018-06-23 MED ORDER — ACETAMINOPHEN 325 MG PO TABS: 650.0000 mg | ORAL_TABLET | ORAL | Status: DC | PRN

## 2018-06-23 NOTE — Progress Notes (Signed)
Orders for recovery and discharge are under radiology account.

## 2018-06-23 NOTE — Discharge Instructions (Signed)
No changes to your home medications.    Lumbar Puncture, Care After This sheet gives you information about how to care for yourself after your procedure. Your health care provider may also give you more specific instructions. If you have problems or questions, contact your health care provider. What can I expect after the procedure? After the procedure, it is common to have:  Mild discomfort or pain at the puncture site.  A mild headache that is relieved with pain medicines. Follow these instructions at home: Activity   Lie down flat or rest for as long as directed by your health care provider.  Return to your normal activities as told by your health care provider. Ask your health care provider what activities are safe for you.  Avoid lifting anything heavier than 10 lb (4.5 kg) for at least 12 hours after the procedure.  Do not drive for 24 hours if you were given a medicine to help you relax (sedative) during your procedure.  Do not drive or use heavy machinery while taking prescription pain medicine. Puncture site care  Remove or change your bandage (dressing) as told by your health care provider.  Check your puncture area every day for signs of infection. Check for: ? More pain. ? Redness or swelling. ? Fluid or blood leaking from the puncture site. ? Warmth. ? Pus or a bad smell. General instructions  Take over-the-counter and prescription medicines only as told by your health care provider.  Drink enough fluids to keep your urine clear or pale yellow. Your health care provider may recommend drinking caffeine to prevent a headache.  Keep all follow-up visits as told by your health care provider. This is important. Contact a health care provider if:  You have fever or chills.  You have nausea or vomiting.  You have a headache that lasts for more than 2 days or does not get better with medicine. Get help right away if:  You develop any of the following in your  legs: ? Weakness. ? Numbness. ? Tingling.  You are unable to control when you urinate or have a bowel movement (incontinence).  You have signs of infection around your puncture site, such as: ? More pain. ? Redness or swelling. ? Fluid or blood leakage. ? Warmth. ? Pus or a bad smell.  You are dizzy or you feel like you might faint.  You have a severe headache, especially when you sit or stand. Summary  A lumbar puncture is a procedure in which a small needle is inserted into the lower back to remove fluid that surrounds the brain and spinal cord.  After this procedure, it is common to have a headache and pain around the needle insertion area.  Lying flat, staying hydrated, and drinking caffeine can help prevent headaches.  Monitor your needle insertion site for signs of infection, including warmth, fluid, or more pain.  Get help right away if you develop leg weakness, leg numbness, incontinence, or severe headaches. This information is not intended to replace advice given to you by your health care provider. Make sure you discuss any questions you have with your health care provider. Document Released: 05/12/2013 Document Revised: 06/20/2016 Document Reviewed: 06/20/2016 Elsevier Interactive Patient Education  2019 [employer].

## 2018-06-24 LAB — HM PAP SMEAR: HM Pap smear: NEGATIVE

## 2018-06-26 LAB — CSF CULTURE W GRAM STAIN

## 2018-06-26 LAB — MS PROFILE+MBP, CSF + BLOOD
Albumin CSF-mCnc: 15 mg/dL (ref 11–48)
Albumin: 4.3 g/dL (ref 3.8–4.9)
CSF IgG Index: 0.4 (ref 0.0–0.7)
CSF/Serum Alb. Index: 3 (ref 0–8)
IGG (IMMUNOGLOBIN G), SERUM: 906 mg/dL (ref 700–1600)
IgG Synthetic Rate: 3.6 mg/d
IgG, CSF: 1.4 mg/dL (ref 0.0–8.6)
IgG/Alb Ratio, CSF: 0.09 (ref 0.00–0.25)
Myelin Basic Protein: 1.3 ng/mL (ref 0.0–3.7)

## 2018-06-26 LAB — CSF CULTURE
CULTURE: NO GROWTH
GRAM STAIN: NONE SEEN

## 2018-12-13 ENCOUNTER — Encounter: Payer: Self-pay | Admitting: Emergency Medicine

## 2018-12-13 ENCOUNTER — Other Ambulatory Visit: Payer: Self-pay

## 2018-12-13 ENCOUNTER — Emergency Department
Admission: EM | Admit: 2018-12-13 | Discharge: 2018-12-13 | Disposition: A | Discharge status: Home / Self Care | Payer: Federal, State, Local not specified - PPO | Attending: Emergency Medicine | Admitting: Emergency Medicine

## 2018-12-13 DIAGNOSIS — Z20828 Contact with and (suspected) exposure to other viral communicable diseases: Secondary | ICD-10-CM | POA: Diagnosis not present

## 2018-12-13 DIAGNOSIS — Z87891 Personal history of nicotine dependence: Secondary | ICD-10-CM | POA: Insufficient documentation

## 2018-12-13 DIAGNOSIS — J3489 Other specified disorders of nose and nasal sinuses: Secondary | ICD-10-CM | POA: Diagnosis present

## 2018-12-13 DIAGNOSIS — R6883 Chills (without fever): Secondary | ICD-10-CM | POA: Diagnosis not present

## 2018-12-13 DIAGNOSIS — J320 Chronic maxillary sinusitis: Secondary | ICD-10-CM | POA: Diagnosis not present

## 2018-12-13 MED ORDER — PREDNISONE 10 MG (21) PO TBPK: ORAL_TABLET | ORAL | 0 refills | Status: DC

## 2018-12-13 NOTE — ED Provider Notes (Signed)
[facility] Emergency Department Provider Note  ____________________________________________  Time seen: Approximately 10:16 AM  I have reviewed the triage vital signs and the nursing notes.   HISTORY  Chief Complaint Cough and Chills   HPI Christine LACHAPELLE is a 53 y.o. female who presents to the emergency department for treatment and evaluation of sinus pain and chills.   She has a history of chronic sinusitis and has been evaluated as recently as last week by her ENT provider.  She was supposed to start taking amoxicillin today, however developed chills and an occasional cough.  She works as a Marine scientist and was encouraged to come to the emergency department for further evaluation and potential COVID testing.   Past Medical History:  Diagnosis Date  . Anxiety   . History of migraine headaches     Patient Active Problem List   Diagnosis Date Noted  . MIGRAINE HEADACHE 08/06/2010    Past Surgical History:  Procedure Laterality Date  . CHOLECYSTECTOMY OPEN    . LASIK    . LEEP      Prior to Admission medications   Medication Sig Start Date End Date Taking? Authorizing Provider  CAMBIA 50 MG PACK Reported on 09/23/2015 02/08/15   [provider]  diazepam (VALIUM) 5 MG tablet Take 5 mg by mouth every 6 (six) hours as needed for anxiety.    [provider]  escitalopram (LEXAPRO) 5 MG tablet  02/01/15   [provider]  ibuprofen (ADVIL,MOTRIN) 200 MG tablet Take 200 mg by mouth every 8 (eight) hours as needed. Reported on 09/23/2015    [provider]  levonorgestrel-ethinyl estradiol (ORSYTHIA) 0.1-20 MG-MCG tablet  05/19/14   [provider]  predniSONE (STERAPRED UNI-PAK 21 TAB) 10 MG (21) TBPK tablet Take 6 tablets on the first day and decrease by 1 tablet each day until finished. 12/13/18   Akirah Storck B, FNP  SUMAtriptan 6 MG/0.5ML SOAJ INJECT AT ONSET OF HEADACHE, MAY REPEAT IN 2 HOURS IF NEEDED BUT NO MORE  THAN 2 IN 24 HOURS 02/01/15   [provider]  zonisamide (ZONEGRAN) 100 MG capsule TAKE 1CAPSULE BY MOUTH AT BEDTIME FOR 1 WEEK, THEN TAKE 2 CAPSULES EVERY NIGHT 02/04/15   [provider]    Allergies Sulfamethoxazole-trimethoprim and Topamax [topiramate]  Family History  Problem Relation Age of Onset  . Cancer Mother   . Hypertension Father   . Hyperlipidemia Father   . Cancer Maternal Grandmother     Social History Social History   Tobacco Use  . Smoking status: Former Smoker    Quit date: 06/07/2011    Years since quitting: 7.5  Substance Use Topics  . Alcohol use: No  . Drug use: No    Review of Systems Constitutional: Negative for fever/positive for chills.  Normal appetite. ENT: Negative for sore throat. Cardiovascular: Denies chest pain. Respiratory: No shortness of breath.  Positive for cough.  Negative wheezing.  Gastrointestinal: Negative for nausea, no vomiting.  Negative for diarrhea.  Musculoskeletal: Positive for body aches Skin: Negative for rash. Neurological: Positive for headaches ____________________________________________   PHYSICAL EXAM:  VITAL SIGNS: ED Triage Vitals  Enc Vitals Group     BP 12/13/18 0953 137/89     Pulse Rate 12/13/18 0953 (!) 108     Resp 12/13/18 0953 (!) 24     Temp 12/13/18 0953 98 F (36.7 C)     Temp Source 12/13/18 0953 Oral     SpO2 12/13/18 0953  97 %     Weight 12/13/18 0953 159 lb (72.1 kg)     Height 12/13/18 0953 5\' 6"  (1.676 m)     Head Circumference --      Peak Flow --      Pain Score 12/13/18 0957 0     Pain Loc --      Pain Edu? --      Excl. in [place]? --     Constitutional: Alert and oriented.  Acutely ill appearing and in no acute distress. Eyes: Conjunctivae are normal. Ears: Exam deferred Nose: Pan sinus congestion noted; no rhinnorhea. Mouth/Throat: Mucous membranes are moist.   Neck: No stridor.  Lymphatic: No cervical lymphadenopathy. Cardiovascular: Normal rate, regular  rhythm. Good peripheral circulation. Respiratory: Respirations are even and unlabored.  No retractions.  Breath sounds clear to auscultation. Gastrointestinal: Soft and nontender.  Musculoskeletal: FROM x 4 extremities.  Neurologic:  Normal speech and language. Skin:  Skin is warm, dry and intact. No rash noted. Psychiatric: Mood and affect are normal. Speech and behavior are normal.  ____________________________________________   LABS (all labs ordered are listed, but only abnormal results are displayed)  Labs Reviewed  NOVEL CORONAVIRUS, NAA (HOSPITAL ORDER, SEND-OUT TO REF LAB)   ____________________________________________  EKG  Not indicated ____________________________________________  RADIOLOGY  Not indicated ____________________________________________   PROCEDURES  Procedure(s) performed: None  Critical Care performed: No ____________________________________________   INITIAL IMPRESSION / ASSESSMENT AND PLAN / ED COURSE  53 y.o. female presenting to the emergency department for treatment and evaluation of chills without fever, occasional cough, body aches, and chronic sinusitis.  She already has a prescription for the amoxicillin and is scheduled to take that today.  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  Send out test will be collected.  She will also be treated with some prednisone.  She was encouraged to take Tylenol if needed for chills.  She was encouraged to remain out of work until her test results are back.  If negative, she is to remain out of work 72 hours after symptoms have resolved without medications.  If positive she is to remain out of work for 2 weeks +72 hours from the last symptom.   Christine Bishop was evaluated in Emergency Department on 12/13/2018 for the symptoms described in the history of present illness. She was evaluated in the context of the global COVID-19 pandemic, which necessitated  consideration that the patient might be at risk for infection with the SARS-CoV-2 virus that causes COVID-19. Institutional protocols and algorithms that pertain to the evaluation of patients at risk for COVID-19 are in a state of rapid change based on information released by regulatory bodies including the CDC and federal and state organizations. These policies and algorithms were followed during the patient's care in the ED.    Medications - No data to display  ED Discharge Orders         Ordered    predniSONE (STERAPRED UNI-PAK 21 TAB) 10 MG (21) TBPK tablet     12/13/18 1025           Pertinent labs & imaging results that were available during my care of the patient were reviewed by me and considered in my medical decision making (see chart for details).    If controlled substance prescribed during this visit, 12 month history viewed on the [place] prior to issuing an initial prescription for Schedule II or III opiod. ____________________________________________  FINAL CLINICAL IMPRESSION(S) / ED DIAGNOSES  Final diagnoses:  Chronic maxillary sinusitis  Chills without fever    Note:  This document was prepared using Dragon voice recognition software and may include unintentional dictation errors.    Victorino Dike, FNP 12/13/18 1047    Schuyler Amor, MD 12/13/18 1335

## 2018-12-13 NOTE — ED Triage Notes (Signed)
Pt to ED via POV c/o cough and chills. Pt states that her symptoms started this morning a few hours ago. Pt states that she chronic sinus issues and OTC mediation does not work for her. Pt is in NAD at this time.

## 2018-12-16 LAB — NOVEL CORONAVIRUS, NAA (HOSP ORDER, SEND-OUT TO REF LAB; TAT 18-24 HRS): SARS-CoV-2, NAA: NOT DETECTED

## 2019-10-21 DIAGNOSIS — H6983 Other specified disorders of Eustachian tube, bilateral: Secondary | ICD-10-CM | POA: Diagnosis not present

## 2019-11-05 DIAGNOSIS — H903 Sensorineural hearing loss, bilateral: Secondary | ICD-10-CM | POA: Diagnosis not present

## 2019-11-05 DIAGNOSIS — Z9889 Other specified postprocedural states: Secondary | ICD-10-CM | POA: Diagnosis not present

## 2019-11-05 DIAGNOSIS — H6983 Other specified disorders of Eustachian tube, bilateral: Secondary | ICD-10-CM | POA: Diagnosis not present

## 2019-11-05 DIAGNOSIS — H9313 Tinnitus, bilateral: Secondary | ICD-10-CM | POA: Diagnosis not present

## 2019-11-17 DIAGNOSIS — M47812 Spondylosis without myelopathy or radiculopathy, cervical region: Secondary | ICD-10-CM | POA: Diagnosis not present

## 2019-11-17 DIAGNOSIS — G43001 Migraine without aura, not intractable, with status migrainosus: Secondary | ICD-10-CM | POA: Diagnosis not present

## 2019-11-27 DIAGNOSIS — M5030 Other cervical disc degeneration, unspecified cervical region: Secondary | ICD-10-CM | POA: Diagnosis not present

## 2019-11-27 DIAGNOSIS — G43001 Migraine without aura, not intractable, with status migrainosus: Secondary | ICD-10-CM | POA: Diagnosis not present

## 2019-11-27 DIAGNOSIS — M50322 Other cervical disc degeneration at C5-C6 level: Secondary | ICD-10-CM | POA: Diagnosis not present

## 2019-11-27 DIAGNOSIS — M47812 Spondylosis without myelopathy or radiculopathy, cervical region: Secondary | ICD-10-CM | POA: Diagnosis not present

## 2019-11-27 DIAGNOSIS — M5481 Occipital neuralgia: Secondary | ICD-10-CM | POA: Diagnosis not present

## 2019-12-14 ENCOUNTER — Ambulatory Visit (INDEPENDENT_AMBULATORY_CARE_PROVIDER_SITE_OTHER): Payer: Federal, State, Local not specified - PPO | Admitting: Ophthalmology

## 2019-12-14 ENCOUNTER — Encounter (INDEPENDENT_AMBULATORY_CARE_PROVIDER_SITE_OTHER): Payer: Self-pay | Admitting: Ophthalmology

## 2019-12-14 ENCOUNTER — Other Ambulatory Visit: Payer: Self-pay

## 2019-12-14 DIAGNOSIS — H43812 Vitreous degeneration, left eye: Secondary | ICD-10-CM

## 2019-12-14 DIAGNOSIS — H353131 Nonexudative age-related macular degeneration, bilateral, early dry stage: Secondary | ICD-10-CM

## 2019-12-14 DIAGNOSIS — H35372 Puckering of macula, left eye: Secondary | ICD-10-CM | POA: Insufficient documentation

## 2019-12-14 DIAGNOSIS — H35351 Cystoid macular degeneration, right eye: Secondary | ICD-10-CM

## 2019-12-14 DIAGNOSIS — H35371 Puckering of macula, right eye: Secondary | ICD-10-CM

## 2019-12-14 HISTORY — DX: Vitreous degeneration, left eye: H43.812

## 2019-12-14 NOTE — Progress Notes (Signed)
12/14/2019     CHIEF COMPLAINT Patient presents for Retina Follow Up   HISTORY OF PRESENT ILLNESS: Christine Bishop is a 54 y.o. female who presents to the clinic today for:   HPI    Retina Follow Up    Diagnosis: ERM and CME.  In both eyes.  Severity is moderate.  Duration of 6 months.  Since onset it is stable.  I, the attending physician,  performed the HPI with the patient and updated documentation appropriately.          Comments    6 Month f\u OU. OCT  Pt states no changes in vision. Pt is using gtts in OD as directed. Pt states she is still seeing large floater in OS. Pt has a hard time reading due to the floater.       Last edited by Tilda Franco on 12/14/2019  8:27 AM. (History)      Referring physician: Tamsen Roers, [place],  [place] [id number]  HISTORICAL INFORMATION:   Selected notes from the MEDICAL RECORD NUMBER       CURRENT MEDICATIONS: Current Outpatient Medications (Ophthalmic Drugs)  Medication Sig  . ketorolac (ACULAR) 0.4 % SOLN 1 drop 2 times daily.   No current facility-administered medications for this visit. (Ophthalmic Drugs)   Current Outpatient Medications (Other)  Medication Sig  . buPROPion (WELLBUTRIN SR) 150 MG 12 hr tablet bupropion HCl SR 150 mg tablet,12 hr sustained-release  TAKE 1 TABLET BY MOUTH DAILY IN THE MORNING FOR 7 DAYS THEN INCREASE DOSE TO 2 TABS( 300 MG) A DAY  . ondansetron (ZOFRAN-ODT) 8 MG disintegrating tablet Take by mouth.  . Rimegepant Sulfate (NURTEC) 75 MG TBDP TAKE 1 TABLET AT ONSET OF HEADACHE. MAY REPEAT IN 24 HOURS IF NEEDED  . baclofen (LIORESAL) 10 MG tablet SMARTSIG:Tablet(s) By Mouth Every 6-8 Hours PRN  . buPROPion (WELLBUTRIN XL) 150 MG 24 hr tablet Take 150 mg by mouth every morning.  Marland Kitchen CAMBIA 50 MG PACK Reported on 09/23/2015  . cefdinir (OMNICEF) 300 MG capsule Take 300 mg by mouth every 12 (twelve) hours.  . diazepam (VALIUM) 5 MG tablet Take 5 mg by mouth every 6 (six) hours  as needed for anxiety.  Marland Kitchen EMGALITY 120 MG/ML SOAJ SMARTSIG:1 Milliliter(s) SUB-Q  . Erenumab-aooe (AIMOVIG) 140 MG/ML SOAJ Aimovig Autoinjector 140 mg/mL subcutaneous auto-injector  INJECT 140 MG INTO THE SKIN EVERY 30 DAYS.  Marland Kitchen escitalopram (LEXAPRO) 5 MG tablet   . Fe-Succ Ac-B Cmplx-C-Ca-FA (IROSPAN 24/6) MISC Take 1 tablet by mouth daily.  . fexofenadine (ALLEGRA) 180 MG tablet Take by mouth.  . fluconazole (DIFLUCAN) 100 MG tablet fluconazole 100 mg tablet  . gabapentin (NEURONTIN) 100 MG capsule gabapentin 100 mg capsule  . ibuprofen (ADVIL,MOTRIN) 200 MG tablet Take 200 mg by mouth every 8 (eight) hours as needed. Reported on 09/23/2015  . levonorgestrel-ethinyl estradiol (ORSYTHIA) 0.1-20 MG-MCG tablet   . magnesium gluconate (MAGONATE) 500 MG tablet Take by mouth.  . meloxicam (MOBIC) 15 MG tablet meloxicam 15 mg tablet  . nitrofurantoin, macrocrystal-monohydrate, (MACROBID) 100 MG capsule Take 100 mg by mouth every 12 (twelve) hours.  . predniSONE (STERAPRED UNI-PAK 21 TAB) 10 MG (21) TBPK tablet Take 6 tablets on the first day and decrease by 1 tablet each day until finished.  . pregabalin (LYRICA) 50 MG capsule pregabalin 50 mg capsule  TAKE 1 CAPSULE BY MOUTH 3 TIMES DAILY FOR 30 DAYS  . SUMAtriptan 6 MG/0.5ML SOAJ  INJECT AT ONSET OF HEADACHE, MAY REPEAT IN 2 HOURS IF NEEDED BUT NO MORE THAN 2 IN 24 HOURS  . traMADol (ULTRAM) 50 MG tablet SMARTSIG:1 Tablet(s) By Mouth 1 to 3 Times Daily PRN  . zonisamide (ZONEGRAN) 100 MG capsule TAKE 1CAPSULE BY MOUTH AT BEDTIME FOR 1 WEEK, THEN TAKE 2 CAPSULES EVERY NIGHT   No current facility-administered medications for this visit. (Other)      REVIEW OF SYSTEMS:    ALLERGIES Allergies  Allergen Reactions  . Sulfamethoxazole-Trimethoprim     REACTION: nausea; vomiting  . Topamax [Topiramate] Anxiety    PAST MEDICAL HISTORY Past Medical History:  Diagnosis Date  . Anxiety   . History of migraine headaches    Past Surgical  History:  Procedure Laterality Date  . CHOLECYSTECTOMY OPEN    . LASIK    . LEEP      FAMILY HISTORY Family History  Problem Relation Age of Onset  . Cancer Mother   . Hypertension Father   . Hyperlipidemia Father   . Cancer Maternal Grandmother     SOCIAL HISTORY Social History   Tobacco Use  . Smoking status: Former Smoker    Quit date: 06/07/2011    Years since quitting: 8.5  . Smokeless tobacco: Never Used  Substance Use Topics  . Alcohol use: No  . Drug use: No         OPHTHALMIC EXAM:  Base Eye Exam    Visual Acuity (Snellen - Linear)      Right Left   Dist cc 20/25 20/30       Tonometry (Tonopen, 8:31 AM)      Right Left   Pressure 9 10       Pupils      Pupils Dark Light Shape React APD   Right PERRL 4.5 3 Round Brisk None   Left PERRL 4.5 3 Round Brisk None       Visual Fields (Counting fingers)      Left Right    Full Full       Neuro/Psych    Oriented x3: Yes   Mood/Affect: Normal       Dilation    Both eyes: 1.0% Mydriacyl, 2.5% Phenylephrine @ 8:31 AM        Slit Lamp and Fundus Exam    External Exam      Right Left   External Normal Normal       Slit Lamp Exam      Right Left   Lids/Lashes Normal Normal   Conjunctiva/Sclera White and quiet White and quiet   Cornea Clear Clear   Anterior Chamber Deep and quiet Deep and quiet   Iris Round and reactive Round and reactive   Lens Centered posterior chamber intraocular lens Centered posterior chamber intraocular lens   Anterior Vitreous clear, vitrectomized clear       Fundus Exam      Right Left   Posterior Vitreous Vitrectomized Vitrectomized   Disc Normal Normal   C/D Ratio 0.25 0.25   Macula Normal Normal   Vessels Normal Normal   Periphery Normal, no holes or tears Normal, no holes or tears          IMAGING AND PROCEDURES  Imaging and Procedures for 12/14/19  OCT, Retina - OU - Both Eyes       Right Eye Quality was borderline. Scan locations included  subfoveal. Progression has been stable. Findings include normal foveal contour.   Left Eye Quality was borderline.  Progression has been stable. Findings include abnormal foveal contour, epiretinal membrane.   Notes OD, status post vitrectomy for epiretinal membrane and CME, now near normal Foveal contour.  Baseline reading of foveal thickness was erroneously measured by the computer.  OS, similar baseline erroneous measuring in the fovea, epiretinal membrane, stable over time left eye.                ASSESSMENT/PLAN:  Cystoid macular edema of right eye See recurrent CME in the past off of topical NSAID.  Will continue on topical NSAID OD twice daily to prevent CME recurrence.  Nonexudative age-related macular degeneration, bilateral, early dry stage The nature of dry age related macular degeneration was discussed with the patient as well as its possible conversion to wet. The results of the AREDS 2 study was discussed with the patient. A diet rich in dark leafy green vegetables was advised and specific recommendations were made regarding supplements with AREDS 2 formulation . Control of hypertension and serum cholesterol may slow the disease. Smoking cessation is mandatory to slow the disease and diminish the risk of progressing to wet age related macular degeneration. The patient was instructed in the use of an [place] and was told to return immediately for any changes in the Grid. Stressed to the patient do not rub eyes      ICD-10-CM   1. Posterior vitreous detachment of left eye  H43.812   2. Macular pucker, right eye  H35.371 OCT, Retina - OU - Both Eyes  3. Cystoid macular edema of right eye  H35.351 OCT, Retina - OU - Both Eyes  4. Nonexudative age-related macular degeneration, bilateral, early dry stage  H35.3131   5. Macular puckering, left eye  H35.372     1.  We will continue on topical NSAIDs OD twice daily for history of recurrent CME OD.  Acuity is terrific and  maintained in this fashion.  2.  3.  Ophthalmic Meds Ordered this visit:  No orders of the defined types were placed in this encounter.      Return in about 8 months (around 08/13/2020) for DILATE OU, OCT.  There are no Patient Instructions on file for this visit.   Explained the diagnoses, plan, and follow up with the patient and they expressed understanding.  Patient expressed understanding of the importance of proper follow up care.   Clent Demark Jacquelynne Guedes M.D. Diseases & Surgery of the Retina and Vitreous Retina [facility] Diabetic [place] 12/14/19     Abbreviations: M myopia (nearsighted); A astigmatism; H hyperopia (farsighted); P presbyopia; Mrx spectacle prescription;  CTL contact lenses; OD right eye; OS left eye; OU both eyes  XT exotropia; ET esotropia; PEK punctate epithelial keratitis; PEE punctate epithelial erosions; DES dry eye syndrome; MGD meibomian gland dysfunction; ATs artificial tears; PFAT's preservative free artificial tears; [place] nuclear sclerotic cataract; PSC posterior subcapsular cataract; ERM epi-retinal membrane; PVD posterior vitreous detachment; RD retinal detachment; DM diabetes mellitus; DR diabetic retinopathy; NPDR non-proliferative diabetic retinopathy; PDR proliferative diabetic retinopathy; CSME clinically significant macular edema; DME diabetic macular edema; dbh dot blot hemorrhages; CWS cotton wool spot; POAG primary open angle glaucoma; C/D cup-to-disc ratio; HVF humphrey visual field; GVF goldmann visual field; OCT optical coherence tomography; IOP intraocular pressure; BRVO Branch retinal vein occlusion; CRVO central retinal vein occlusion; CRAO central retinal artery occlusion; BRAO branch retinal artery occlusion; RT retinal tear; SB scleral buckle; PPV pars plana vitrectomy; VH Vitreous hemorrhage; PRP panretinal laser photocoagulation; IVK intravitreal kenalog; VMT vitreomacular  traction; MH Macular hole;  NVD neovascularization of the disc; NVE  neovascularization elsewhere; AREDS age related eye disease study; ARMD age related macular degeneration; POAG primary open angle glaucoma; EBMD epithelial/anterior basement membrane dystrophy; ACIOL anterior chamber intraocular lens; IOL intraocular lens; PCIOL posterior chamber intraocular lens; Phaco/IOL phacoemulsification with intraocular lens placement; [place] photorefractive keratectomy; LASIK laser assisted in situ keratomileusis; HTN hypertension; DM diabetes mellitus; COPD chronic obstructive pulmonary disease

## 2019-12-14 NOTE — Assessment & Plan Note (Signed)
See recurrent CME in the past off of topical NSAID.  Will continue on topical NSAID OD twice daily to prevent CME recurrence.

## 2019-12-14 NOTE — Assessment & Plan Note (Signed)

## 2019-12-15 DIAGNOSIS — M5481 Occipital neuralgia: Secondary | ICD-10-CM | POA: Diagnosis not present

## 2019-12-24 ENCOUNTER — Other Ambulatory Visit: Payer: Self-pay

## 2019-12-24 ENCOUNTER — Ambulatory Visit: Payer: Federal, State, Local not specified - PPO | Admitting: Dermatology

## 2019-12-24 DIAGNOSIS — L719 Rosacea, unspecified: Secondary | ICD-10-CM | POA: Diagnosis not present

## 2019-12-24 DIAGNOSIS — L559 Sunburn, unspecified: Secondary | ICD-10-CM | POA: Diagnosis not present

## 2019-12-24 DIAGNOSIS — D239 Other benign neoplasm of skin, unspecified: Secondary | ICD-10-CM

## 2019-12-24 DIAGNOSIS — D485 Neoplasm of uncertain behavior of skin: Secondary | ICD-10-CM | POA: Diagnosis not present

## 2019-12-24 DIAGNOSIS — D225 Melanocytic nevi of trunk: Secondary | ICD-10-CM | POA: Diagnosis not present

## 2019-12-24 HISTORY — DX: Other benign neoplasm of skin, unspecified: D23.9

## 2019-12-24 MED ORDER — METRONIDAZOLE 0.75 % EX LOTN: TOPICAL_LOTION | CUTANEOUS | 6 refills | Status: DC

## 2019-12-24 NOTE — Patient Instructions (Signed)

## 2019-12-24 NOTE — Progress Notes (Signed)
Follow-Up Visit   Subjective  Christine BLAZEJEWSKI is a 54 y.o. female who presents for the following: Annual Exam (No new or changing moles, lesions or spots. She did have a bad sun burn about 2 weeks ago). The patient presents for Total-Body Skin Exam (TBSE) for skin cancer screening and mole check.  The following portions of the chart were reviewed this encounter and updated as appropriate:  Tobacco  Allergies  Meds  Problems  Med Hx  Surg Hx  Fam Hx     Review of Systems:  No other skin or systemic complaints except as noted in HPI or Assessment and Plan.  Objective  Well appearing patient in no apparent distress; mood and affect are within normal limits.  A full examination was performed including scalp, head, eyes, ears, nose, lips, neck, chest, axillae, abdomen, back, buttocks, bilateral upper extremities, bilateral lower extremities, hands, feet, fingers, toes, fingernails, and toenails. All findings within normal limits unless otherwise noted below.  Objective  Face: Pinkness on the cheeks  Objective  arms and feet: Erythema  Objective  Right upper back paraspinal: 0.4 cm irregular brown macule   Objective  R mid to upper back 3.0cm lat to spine above braline: 0.5 irregular brown macule   Assessment & Plan  Rosacea Face Start Metronidazole lotion QHS. METRONIDAZOLE, TOPICAL, 0.75 % LOTN - Face  Sunburn arms and feet 1% hydrocortisone Recommend photo protection daily.  Neoplasm of uncertain behavior of skin (2) Right upper back paraspinal  Epidermal / dermal shaving  Lesion diameter (cm):  0.4 Informed consent: discussed and consent obtained   Timeout: patient name, date of birth, surgical site, and procedure verified   Procedure prep:  Patient was prepped and draped in usual sterile fashion Prep type:  Isopropyl alcohol Anesthesia: the lesion was anesthetized in a standard fashion   Anesthetic:  1% lidocaine w/ epinephrine 1-100,000 buffered w/  8.4% NaHCO3 Instrument used: flexible razor blade   Hemostasis achieved with: pressure, aluminum chloride and electrodesiccation   Outcome: patient tolerated procedure well   Post-procedure details: sterile dressing applied and wound care instructions given   Dressing type: bandage and petrolatum    Specimen 1 - Surgical pathology Differential Diagnosis: D48.5 r/o dysplastic nevus  Check Margins: No 0.4 cm irregular brown macule  R mid to upper back 3.0cm lat to spine above braline  Epidermal / dermal shaving  Lesion diameter (cm):  0.5 Informed consent: discussed and consent obtained   Timeout: patient name, date of birth, surgical site, and procedure verified   Procedure prep:  Patient was prepped and draped in usual sterile fashion Prep type:  Isopropyl alcohol Anesthesia: the lesion was anesthetized in a standard fashion   Anesthetic:  1% lidocaine w/ epinephrine 1-100,000 buffered w/ 8.4% NaHCO3 Instrument used: flexible razor blade   Hemostasis achieved with: pressure, aluminum chloride and electrodesiccation   Outcome: patient tolerated procedure well   Post-procedure details: sterile dressing applied and wound care instructions given   Dressing type: bandage and petrolatum    Specimen 2 - Surgical pathology Differential Diagnosis: D48.5 r/o dysplastic nevus  Check Margins: No 0.5 irregular brown macule   Lentigines - Scattered tan macules - Discussed due to sun exposure - Benign, observe - Call for any changes  Seborrheic Keratoses - Stuck-on, waxy, tan-brown papules and plaques  - Discussed benign etiology and prognosis. - Observe - Call for any changes  Melanocytic Nevi - Tan-brown and/or pink-flesh-colored symmetric macules and papules - Benign appearing on exam  today - Observation - Call clinic for new or changing moles - Recommend daily use of broad spectrum spf 30+ sunscreen to sun-exposed areas.   Hemangiomas - Red papules - Discussed benign  nature - Observe - Call for any changes  Actinic Damage - diffuse scaly erythematous macules with underlying dyspigmentation - Recommend daily broad spectrum sunscreen SPF 30+ to sun-exposed areas, reapply every 2 hours as needed.  - Call for new or changing lesions.  Skin cancer screening performed today.  Return in about 1 year (around 12/23/2020) for TBSE.  Luther Redo, CMA, am acting as scribe for Sarina Ser, MD .  Documentation: I have reviewed the above documentation for accuracy and completeness, and I agree with the above.  Sarina Ser, MD

## 2019-12-26 ENCOUNTER — Encounter: Payer: Self-pay | Admitting: Dermatology

## 2019-12-29 ENCOUNTER — Telehealth: Payer: Self-pay

## 2019-12-29 NOTE — Telephone Encounter (Signed)
Patient informed of pathology results 

## 2019-12-29 NOTE — Telephone Encounter (Signed)
-----   Message from Ralene Bathe, MD sent at 12/29/2019  9:31 AM EDT ----- 1. Skin , right upper back paraspinal DYSPLASTIC COMPOUND NEVUS WITH MODERATE ATYPIA, LIMITED MARGINS FREE 2. Skin , right to mid upper back 3.0cm lat to spine above braline DYSPLASTIC COMPOUND NEVUS WITH MILD ATYPIA, DEEP MARGIN INVOLVED  1- dysplastic Moderate 2- dysplastic Mild Recheck next visit

## 2020-01-11 DIAGNOSIS — M5481 Occipital neuralgia: Secondary | ICD-10-CM | POA: Diagnosis not present

## 2020-01-12 DIAGNOSIS — F419 Anxiety disorder, unspecified: Secondary | ICD-10-CM | POA: Diagnosis not present

## 2020-01-12 DIAGNOSIS — R7989 Other specified abnormal findings of blood chemistry: Secondary | ICD-10-CM | POA: Diagnosis not present

## 2020-01-12 DIAGNOSIS — D529 Folate deficiency anemia, unspecified: Secondary | ICD-10-CM | POA: Diagnosis not present

## 2020-01-12 DIAGNOSIS — F410 Panic disorder [episodic paroxysmal anxiety] without agoraphobia: Secondary | ICD-10-CM | POA: Diagnosis not present

## 2020-01-12 DIAGNOSIS — E119 Type 2 diabetes mellitus without complications: Secondary | ICD-10-CM | POA: Diagnosis not present

## 2020-01-12 DIAGNOSIS — Z0000 Encounter for general adult medical examination without abnormal findings: Secondary | ICD-10-CM | POA: Diagnosis not present

## 2020-01-12 DIAGNOSIS — E785 Hyperlipidemia, unspecified: Secondary | ICD-10-CM | POA: Diagnosis not present

## 2020-01-12 DIAGNOSIS — F329 Major depressive disorder, single episode, unspecified: Secondary | ICD-10-CM | POA: Diagnosis not present

## 2020-03-14 DIAGNOSIS — M5481 Occipital neuralgia: Secondary | ICD-10-CM | POA: Diagnosis not present

## 2020-04-11 DIAGNOSIS — D509 Iron deficiency anemia, unspecified: Secondary | ICD-10-CM | POA: Diagnosis not present

## 2020-04-11 DIAGNOSIS — E785 Hyperlipidemia, unspecified: Secondary | ICD-10-CM | POA: Diagnosis not present

## 2020-04-11 DIAGNOSIS — M1990 Unspecified osteoarthritis, unspecified site: Secondary | ICD-10-CM | POA: Diagnosis not present

## 2020-04-11 DIAGNOSIS — F419 Anxiety disorder, unspecified: Secondary | ICD-10-CM | POA: Diagnosis not present

## 2020-04-12 DIAGNOSIS — M5481 Occipital neuralgia: Secondary | ICD-10-CM | POA: Diagnosis not present

## 2020-04-20 ENCOUNTER — Other Ambulatory Visit: Payer: Self-pay

## 2020-04-20 ENCOUNTER — Encounter (INDEPENDENT_AMBULATORY_CARE_PROVIDER_SITE_OTHER): Payer: Self-pay | Admitting: Ophthalmology

## 2020-04-20 ENCOUNTER — Ambulatory Visit (INDEPENDENT_AMBULATORY_CARE_PROVIDER_SITE_OTHER): Payer: Federal, State, Local not specified - PPO | Admitting: Ophthalmology

## 2020-04-20 DIAGNOSIS — H43312 Vitreous membranes and strands, left eye: Secondary | ICD-10-CM | POA: Diagnosis not present

## 2020-04-20 DIAGNOSIS — H35351 Cystoid macular degeneration, right eye: Secondary | ICD-10-CM

## 2020-04-20 DIAGNOSIS — H43812 Vitreous degeneration, left eye: Secondary | ICD-10-CM

## 2020-04-20 DIAGNOSIS — H35371 Puckering of macula, right eye: Secondary | ICD-10-CM

## 2020-04-20 DIAGNOSIS — H35372 Puckering of macula, left eye: Secondary | ICD-10-CM | POA: Diagnosis not present

## 2020-04-20 HISTORY — DX: Vitreous membranes and strands, left eye: H43.312

## 2020-04-20 MED ORDER — PREDNISOLONE ACETATE 1 % OP SUSP: 1.0000 [drp] | Freq: Four times a day (QID) | OPHTHALMIC | 0 refills | Status: AC

## 2020-04-20 MED ORDER — OFLOXACIN 0.3 % OP SOLN: 1.0000 [drp] | Freq: Four times a day (QID) | OPHTHALMIC | 0 refills | Status: AC

## 2020-04-20 NOTE — Progress Notes (Signed)
04/20/2020     CHIEF COMPLAINT Patient presents for Retina Follow Up   HISTORY OF PRESENT ILLNESS: Christine Bishop is a 54 y.o. female who presents to the clinic today for:   HPI    Retina Follow Up    Patient presents with  Other.  In left eye.  This started 4 months ago.  Severity is mild.  Duration of 4 months.  Since onset it is stable.          Comments    4 MO F/U, POSS VICTRECTOMY OS   Pt states "I have a huge floater in the center of my vision in my left eye".  Pt reports trouble with computer use at work. Pt reports eyestrain in OD, dryness OU.  OD with excellent visual acuity, yet using computer 1011 hrs. a day, the floaters are impacting her acuity in the left eye.       Last edited by Hurman Horn, MD on 04/20/2020  9:04 AM. (History)      Referring physician: Tamsen Roers, [place],  [place] [address]  HISTORICAL INFORMATION:   Selected notes from the MEDICAL RECORD NUMBER       CURRENT MEDICATIONS: Current Outpatient Medications (Ophthalmic Drugs)  Medication Sig  . ketorolac (ACULAR) 0.4 % SOLN Place into the right eye.   Derrill Memo ON 04/25/2020] ofloxacin (OCUFLOX) 0.3 % ophthalmic solution Place 1 drop into the left eye 4 (four) times daily for 21 days.  Derrill Memo ON 04/25/2020] prednisoLONE acetate (PRED FORTE) 1 % ophthalmic suspension Place 1 drop into the left eye 4 (four) times daily for 21 days.   No current facility-administered medications for this visit. (Ophthalmic Drugs)   Current Outpatient Medications (Other)  Medication Sig  . b complex vitamins tablet Take 1 tablet by mouth daily.  . baclofen (LIORESAL) 10 MG tablet SMARTSIG:Tablet(s) By Mouth Every 6-8 Hours PRN  . CAMBIA 50 MG PACK Reported on 09/23/2015  . diazepam (VALIUM) 5 MG tablet Take 5 mg by mouth every 6 (six) hours as needed for anxiety.  Marland Kitchen escitalopram (LEXAPRO) 5 MG tablet   . fexofenadine (ALLEGRA) 180 MG tablet Take by mouth.  . fluticasone (FLONASE) 50  MCG/ACT nasal spray Place into both nostrils daily.  Marland Kitchen ibuprofen (ADVIL,MOTRIN) 200 MG tablet Take 200 mg by mouth every 8 (eight) hours as needed. Reported on 09/23/2015  . magnesium gluconate (MAGONATE) 500 MG tablet Take by mouth.  . METRONIDAZOLE, TOPICAL, 0.75 % LOTN Apply a thin coat to the entire face QHS  . Multiple Vitamins-Minerals (PRESERVISION AREDS PO) Take by mouth.  . ondansetron (ZOFRAN-ODT) 8 MG disintegrating tablet Take by mouth.  . Rimegepant Sulfate (NURTEC) 75 MG TBDP TAKE 1 TABLET AT ONSET OF HEADACHE. MAY REPEAT IN 24 HOURS IF NEEDED  . SUMAtriptan 6 MG/0.5ML SOAJ INJECT AT ONSET OF HEADACHE, MAY REPEAT IN 2 HOURS IF NEEDED BUT NO MORE THAN 2 IN 24 HOURS  . UNABLE TO FIND Marylou Flesher - OBC   No current facility-administered medications for this visit. (Other)      REVIEW OF SYSTEMS:    ALLERGIES Allergies  Allergen Reactions  . Sulfamethoxazole-Trimethoprim     REACTION: nausea; vomiting  . Topamax [Topiramate] Anxiety    PAST MEDICAL HISTORY Past Medical History:  Diagnosis Date  . Anxiety   . History of migraine headaches    Past Surgical History:  Procedure Laterality Date  . CHOLECYSTECTOMY OPEN    . LASIK    .  LEEP      FAMILY HISTORY Family History  Problem Relation Age of Onset  . Cancer Mother   . Hypertension Father   . Hyperlipidemia Father   . Cancer Maternal Grandmother     SOCIAL HISTORY Social History   Tobacco Use  . Smoking status: Former Smoker    Quit date: 06/07/2011    Years since quitting: 8.8  . Smokeless tobacco: Never Used  Substance Use Topics  . Alcohol use: No  . Drug use: No         OPHTHALMIC EXAM:  Base Eye Exam    Visual Acuity (ETDRS)      Right Left   Dist cc 20/25 +2 20/30 -1   Dist ph cc  NI   Correction: Glasses       Tonometry (Tonopen, 8:18 AM)      Right Left   Pressure 10 10       Pupils      Pupils Dark Light Shape React APD   Right PERRL 5 4 Round Brisk None   Left PERRL 5 4  Round Brisk None       Visual Fields (Counting fingers)      Left Right    Full Full       Extraocular Movement      Right Left    Full Full       Neuro/Psych    Oriented x3: Yes   Mood/Affect: Normal       Dilation    Both eyes: 1.0% Mydriacyl, 2.5% Phenylephrine @ 8:18 AM        Slit Lamp and Fundus Exam    External Exam      Right Left   External Normal Normal       Slit Lamp Exam      Right Left   Lids/Lashes Normal Normal   Conjunctiva/Sclera White and quiet White and quiet   Cornea Clear Clear   Anterior Chamber Deep and quiet Deep and quiet   Iris Round and reactive Round and reactive   Lens Centered posterior chamber intraocular lens Centered posterior chamber intraocular lens   Anterior Vitreous clear, vitrectomized clear       Fundus Exam      Right Left   Posterior Vitreous Vitrectomized Posterior vitreous detachment, Central vitreous floaters,  Vitreous membranes and strands   Disc Normal Normal   C/D Ratio 0.25 0.25   Macula Normal Normal   Vessels Normal Normal   Periphery Normal, no holes or tears Normal, no holes or tears          IMAGING AND PROCEDURES  Imaging and Procedures for 04/20/20  OCT, Retina - OU - Both Eyes       Right Eye Quality was good. Scan locations included subfoveal. Central Foveal Thickness: 343. Progression has been stable. Findings include abnormal foveal contour, epiretinal membrane.   Left Eye Quality was good. Scan locations included subfoveal. Central Foveal Thickness: 287. Progression has been stable. Findings include epiretinal membrane, abnormal foveal contour.   Notes Nondistorting epiretinal membrane nasal to the fovea OD  OS with with dense vitreous debris, shadowing along the macula and parafoveal easily seen on OCT.  With incidental posterior vitreous detachment, mild epiretinal membrane also noted nasal to the fovea nondistorting to the fovea.                   ASSESSMENT/PLAN:  Vitreous membranes and strands, left OS with significant visual acuity impact on activities  of daily living particularly reading computer screens "all day".  The patient has been troubled for a long time with this condition in each eye, and has improved in the right eye status post vitrectomy, now has significant impact on activities of daily living and visual functioning in the left eye and patient wishes to proceed with removal of dense vitreous membranes and strands via vitrectomy left eye under local anesthesia monitored anesthesia control  Risk and benefits have been reviewed again  Macular puckering, left eye Minor OS, nonfoveal distorting  Macular pucker, right eye Minor OD, nonfoveal distorting  Cystoid macular edema of right eye Patient continues on topical NSAIDs twice daily OD long-term, with good effect, no recurrence of CME      ICD-10-CM   1. Posterior vitreous detachment of left eye  H43.812 OCT, Retina - OU - Both Eyes  2. Vitreous membranes and strands, left  H43.312   3. Macular puckering, left eye  H35.372   4. Macular pucker, right eye  H35.371   5. Cystoid macular edema of right eye  H35.351     1.  Vitreous membranes and strands left eye with visual impact on activities of daily living all day every day particularly with computer work  2.  Patient wished to proceed with vitrectomy left eye under local MAC, similar to her procedure in the fellow eye.  3.  I addressed with the patient that no attempt will be made to remove epiretinal membrane since currently not on visual impact with no distortion of the topography OS  Ophthalmic Meds Ordered this visit:  Meds ordered this encounter  Medications  . prednisoLONE acetate (PRED FORTE) 1 % ophthalmic suspension    Sig: Place 1 drop into the left eye 4 (four) times daily for 21 days.    Dispense:  10 mL    Refill:  0  . ofloxacin (OCUFLOX) 0.3 % ophthalmic solution    Sig: Place 1 drop into  the left eye 4 (four) times daily for 21 days.    Dispense:  5 mL    Refill:  0       Return ,, [facility], for Schedule vitrectomy, removal of membranes and strands, OS.  There are no Patient Instructions on file for this visit.   Explained the diagnoses, plan, and follow up with the patient and they expressed understanding.  Patient expressed understanding of the importance of proper follow up care.   Clent Demark Anastashia Westerfeld M.D. Diseases & Surgery of the Retina and Vitreous Retina & Diabetic [place] 04/20/20     Abbreviations: M myopia (nearsighted); A astigmatism; H hyperopia (farsighted); P presbyopia; Mrx spectacle prescription;  CTL contact lenses; OD right eye; OS left eye; OU both eyes  XT exotropia; ET esotropia; PEK punctate epithelial keratitis; PEE punctate epithelial erosions; DES dry eye syndrome; MGD meibomian gland dysfunction; ATs artificial tears; PFAT's preservative free artificial tears; [place] nuclear sclerotic cataract; PSC posterior subcapsular cataract; ERM epi-retinal membrane; PVD posterior vitreous detachment; RD retinal detachment; DM diabetes mellitus; DR diabetic retinopathy; NPDR non-proliferative diabetic retinopathy; PDR proliferative diabetic retinopathy; CSME clinically significant macular edema; DME diabetic macular edema; dbh dot blot hemorrhages; CWS cotton wool spot; POAG primary open angle glaucoma; C/D cup-to-disc ratio; HVF humphrey visual field; GVF goldmann visual field; OCT optical coherence tomography; IOP intraocular pressure; BRVO Branch retinal vein occlusion; CRVO central retinal vein occlusion; CRAO central retinal artery occlusion; BRAO branch retinal artery occlusion; RT retinal tear; SB scleral buckle; PPV pars plana  vitrectomy; VH Vitreous hemorrhage; PRP panretinal laser photocoagulation; IVK intravitreal kenalog; VMT vitreomacular traction; MH Macular hole;  NVD neovascularization of the disc; NVE neovascularization elsewhere; AREDS age  related eye disease study; ARMD age related macular degeneration; POAG primary open angle glaucoma; EBMD epithelial/anterior basement membrane dystrophy; ACIOL anterior chamber intraocular lens; IOL intraocular lens; PCIOL posterior chamber intraocular lens; Phaco/IOL phacoemulsification with intraocular lens placement; [place] photorefractive keratectomy; LASIK laser assisted in situ keratomileusis; HTN hypertension; DM diabetes mellitus; COPD chronic obstructive pulmonary disease

## 2020-04-20 NOTE — Assessment & Plan Note (Signed)
OS with significant visual acuity impact on activities of daily living particularly reading computer screens "all day".  The patient has been troubled for a long time with this condition in each eye, and has improved in the right eye status post vitrectomy, now has significant impact on activities of daily living and visual functioning in the left eye and patient wishes to proceed with removal of dense vitreous membranes and strands via vitrectomy left eye under local anesthesia monitored anesthesia control  Risk and benefits have been reviewed again

## 2020-04-20 NOTE — Assessment & Plan Note (Signed)
Minor OS, nonfoveal distorting

## 2020-04-20 NOTE — Assessment & Plan Note (Signed)
Patient continues on topical NSAIDs twice daily OD long-term, with good effect, no recurrence of CME

## 2020-04-20 NOTE — Assessment & Plan Note (Signed)
Minor OD, nonfoveal distorting

## 2020-04-27 ENCOUNTER — Encounter (INDEPENDENT_AMBULATORY_CARE_PROVIDER_SITE_OTHER): Payer: Federal, State, Local not specified - PPO | Admitting: Ophthalmology

## 2020-04-27 DIAGNOSIS — H43312 Vitreous membranes and strands, left eye: Secondary | ICD-10-CM

## 2020-04-28 ENCOUNTER — Encounter (INDEPENDENT_AMBULATORY_CARE_PROVIDER_SITE_OTHER): Payer: Self-pay | Admitting: Ophthalmology

## 2020-04-28 ENCOUNTER — Other Ambulatory Visit: Payer: Self-pay

## 2020-04-28 ENCOUNTER — Ambulatory Visit (INDEPENDENT_AMBULATORY_CARE_PROVIDER_SITE_OTHER): Payer: Federal, State, Local not specified - PPO | Admitting: Ophthalmology

## 2020-04-28 DIAGNOSIS — Z09 Encounter for follow-up examination after completed treatment for conditions other than malignant neoplasm: Secondary | ICD-10-CM

## 2020-04-28 DIAGNOSIS — H43312 Vitreous membranes and strands, left eye: Secondary | ICD-10-CM

## 2020-04-28 NOTE — Assessment & Plan Note (Signed)
No lifting and bending for 1 week. No water in the eye for 10 days. Do not rub the eye. Wear shield at night for 1-3 days.  Wear your CPAP as normal, if instructed by your doctor.  Continue your topical medications for a total of 3 weeks.  Do not refill your postoperative medications unless instructed.   

## 2020-04-28 NOTE — Assessment & Plan Note (Signed)
Visual symptoms have improved nicely this morning 1 day postop, no floaters seen.

## 2020-04-28 NOTE — Progress Notes (Signed)
04/28/2020     CHIEF COMPLAINT Patient presents for Post-op Follow-up   HISTORY OF PRESENT ILLNESS: Christine Bishop is a 54 y.o. female who presents to the clinic today for:   HPI    Post-op Follow-up    In left eye.  Discomfort includes pain, itching and floaters.          Comments    1 Day PO OS, for vitreous membranes and strands and poor vision. ,   Pt reports that last night she noticed a floater in bright light, some pain last night, and itching today.   Patient reports with the patch off this morning that the all the floaters have improved and are now gone       Last edited by Mrk Buzby, Clent Demark, MD on 04/28/2020 10:30 AM. (History)      Referring physician: Tamsen Roers, MD [place],  [place] [address]  HISTORICAL INFORMATION:   Selected notes from the MEDICAL RECORD NUMBER       CURRENT MEDICATIONS: Current Outpatient Medications (Ophthalmic Drugs)  Medication Sig  . ketorolac (ACULAR) 0.4 % SOLN Place into the right eye 2 (two) times daily.  Marland Kitchen ofloxacin (OCUFLOX) 0.3 % ophthalmic solution Place 1 drop into the left eye 4 (four) times daily for 21 days.  . prednisoLONE acetate (PRED FORTE) 1 % ophthalmic suspension Place 1 drop into the left eye 4 (four) times daily for 21 days.   No current facility-administered medications for this visit. (Ophthalmic Drugs)   Current Outpatient Medications (Other)  Medication Sig  . b complex vitamins tablet Take 1 tablet by mouth daily.  . baclofen (LIORESAL) 10 MG tablet SMARTSIG:Tablet(s) By Mouth Every 6-8 Hours PRN  . CAMBIA 50 MG PACK Reported on 09/23/2015  . diazepam (VALIUM) 5 MG tablet Take 5 mg by mouth every 6 (six) hours as needed for anxiety.  Marland Kitchen escitalopram (LEXAPRO) 5 MG tablet   . fexofenadine (ALLEGRA) 180 MG tablet Take by mouth.  . fluticasone (FLONASE) 50 MCG/ACT nasal spray Place into both nostrils daily.  Marland Kitchen ibuprofen (ADVIL,MOTRIN) 200 MG tablet Take 200 mg by mouth every 8 (eight) hours  as needed. Reported on 09/23/2015  . magnesium gluconate (MAGONATE) 500 MG tablet Take by mouth.  . METRONIDAZOLE, TOPICAL, 0.75 % LOTN Apply a thin coat to the entire face QHS  . Multiple Vitamins-Minerals (PRESERVISION AREDS PO) Take by mouth.  . ondansetron (ZOFRAN-ODT) 8 MG disintegrating tablet Take by mouth.  . Rimegepant Sulfate (NURTEC) 75 MG TBDP TAKE 1 TABLET AT ONSET OF HEADACHE. MAY REPEAT IN 24 HOURS IF NEEDED  . SUMAtriptan 6 MG/0.5ML SOAJ INJECT AT ONSET OF HEADACHE, MAY REPEAT IN 2 HOURS IF NEEDED BUT NO MORE THAN 2 IN 24 HOURS  . UNABLE TO FIND Marylou Flesher - OBC   No current facility-administered medications for this visit. (Other)      REVIEW OF SYSTEMS:    ALLERGIES Allergies  Allergen Reactions  . Sulfamethoxazole-Trimethoprim     REACTION: nausea; vomiting  . Topamax [Topiramate] Anxiety    PAST MEDICAL HISTORY Past Medical History:  Diagnosis Date  . Anxiety   . History of migraine headaches    Past Surgical History:  Procedure Laterality Date  . CHOLECYSTECTOMY OPEN    . LASIK    . LEEP      FAMILY HISTORY Family History  Problem Relation Age of Onset  . Cancer Mother   . Hypertension Father   . Hyperlipidemia Father   .  Cancer Maternal Grandmother     SOCIAL HISTORY Social History   Tobacco Use  . Smoking status: Former Smoker    Quit date: 06/07/2011    Years since quitting: 8.8  . Smokeless tobacco: Never Used  Substance Use Topics  . Alcohol use: No  . Drug use: No         OPHTHALMIC EXAM: Base Eye Exam    Visual Acuity (ETDRS)      Right Left   Dist [place] 20/20 -2 20/20 -2       Tonometry (Tonopen, 10:00 AM)      Right Left   Pressure 11 6       Pupils      Dark Light Shape React APD   Right 5 4 Round Brisk None   Left dilaated           Visual Fields (Counting fingers)      Left Right    Full Full       Extraocular Movement      Right Left    Full Full       Neuro/Psych    Oriented x3: Yes   Mood/Affect:  Normal       Dilation    Left eye: 1.0% Mydriacyl, 2.5% Phenylephrine @ 10:00 AM        Slit Lamp and Fundus Exam    External Exam      Right Left   External  Normal       Slit Lamp Exam      Right Left   Lids/Lashes  Normal   Conjunctiva/Sclera  White and quiet   Cornea  Clear   Anterior Chamber  Deep and quiet   Iris  Round and reactive   Lens  Centered posterior chamber intraocular lens   Anterior Vitreous  clear       Fundus Exam      Right Left   Posterior Vitreous  Vitrectomized, clear   Disc  Normal   C/D Ratio  0.25   Macula  Normal   Vessels  Normal   Periphery  no holes or tears          IMAGING AND PROCEDURES  Imaging and Procedures for 04/28/20           ASSESSMENT/PLAN:  Postoperative follow-up No lifting and bending for 1 week. No water in the eye for 10 days. Do not rub the eye. Wear shield at night for 1-3 days.  Wear your CPAP as normal, if instructed by your doctor.  Continue your topical medications for a total of 3 weeks.  Do not refill your postoperative medications unless instructed.  Vitreous membranes and strands, left Visual symptoms have improved nicely this morning 1 day postop, no floaters seen.      ICD-10-CM   1. Postoperative follow-up  Z09   2. Vitreous membranes and strands, left  H43.312     1.  Patient instructed to return to using topical medications,  Prednisolone acetate 1 drop left eye 4 times daily  Ofloxacin 1 drop left eye 4 times daily  Patient is directed to use the drops for a total of 3 weeks, and do not refill if the case is complete prior to this date  2.  Patient instructed not to compress mash or rub the eye  3.  Ophthalmic Meds Ordered this visit:  No orders of the defined types were placed in this encounter.      Return in about 1 week (  around 05/05/2020) for POST OP, OS, COLOR FP.  There are no Patient Instructions on file for this visit.   Explained the diagnoses, plan, and follow  up with the patient and they expressed understanding.  Patient expressed understanding of the importance of proper follow up care.   Clent Demark Sharaine Delange M.D. Diseases & Surgery of the Retina and Vitreous Retina & Diabetic [place] 04/28/20     Abbreviations: M myopia (nearsighted); A astigmatism; H hyperopia (farsighted); P presbyopia; Mrx spectacle prescription;  CTL contact lenses; OD right eye; OS left eye; OU both eyes  XT exotropia; ET esotropia; PEK punctate epithelial keratitis; PEE punctate epithelial erosions; DES dry eye syndrome; MGD meibomian gland dysfunction; ATs artificial tears; PFAT's preservative free artificial tears; [place] nuclear sclerotic cataract; PSC posterior subcapsular cataract; ERM epi-retinal membrane; PVD posterior vitreous detachment; RD retinal detachment; DM diabetes mellitus; DR diabetic retinopathy; NPDR non-proliferative diabetic retinopathy; PDR proliferative diabetic retinopathy; CSME clinically significant macular edema; DME diabetic macular edema; dbh dot blot hemorrhages; CWS cotton wool spot; POAG primary open angle glaucoma; C/D cup-to-disc ratio; HVF humphrey visual field; GVF goldmann visual field; OCT optical coherence tomography; IOP intraocular pressure; BRVO Branch retinal vein occlusion; CRVO central retinal vein occlusion; CRAO central retinal artery occlusion; BRAO branch retinal artery occlusion; RT retinal tear; SB scleral buckle; PPV pars plana vitrectomy; VH Vitreous hemorrhage; PRP panretinal laser photocoagulation; IVK intravitreal kenalog; VMT vitreomacular traction; MH Macular hole;  NVD neovascularization of the disc; NVE neovascularization elsewhere; AREDS age related eye disease study; ARMD age related macular degeneration; POAG primary open angle glaucoma; EBMD epithelial/anterior basement membrane dystrophy; ACIOL anterior chamber intraocular lens; IOL intraocular lens; PCIOL posterior chamber intraocular lens; Phaco/IOL phacoemulsification with  intraocular lens placement; [place] photorefractive keratectomy; LASIK laser assisted in situ keratomileusis; HTN hypertension; DM diabetes mellitus; COPD chronic obstructive pulmonary disease

## 2020-05-05 ENCOUNTER — Encounter (INDEPENDENT_AMBULATORY_CARE_PROVIDER_SITE_OTHER): Payer: Self-pay | Admitting: Ophthalmology

## 2020-05-05 ENCOUNTER — Ambulatory Visit (INDEPENDENT_AMBULATORY_CARE_PROVIDER_SITE_OTHER): Payer: Federal, State, Local not specified - PPO | Admitting: Ophthalmology

## 2020-05-05 ENCOUNTER — Other Ambulatory Visit: Payer: Self-pay

## 2020-05-05 DIAGNOSIS — H35372 Puckering of macula, left eye: Secondary | ICD-10-CM

## 2020-05-05 DIAGNOSIS — H43312 Vitreous membranes and strands, left eye: Secondary | ICD-10-CM

## 2020-05-05 NOTE — Assessment & Plan Note (Signed)
Minor no visual acuity impact

## 2020-05-05 NOTE — Assessment & Plan Note (Signed)
OS looks great, symptom-free, no more debilitating floaters noted

## 2020-05-05 NOTE — Progress Notes (Signed)
05/05/2020     CHIEF COMPLAINT Patient presents for Post-op Follow-up   HISTORY OF PRESENT ILLNESS: Christine Bishop is a 54 y.o. female who presents to the clinic today for:   HPI    Post-op Follow-up    In left eye.  Discomfort includes none.  Vision is stable.  I, the attending physician,  performed the HPI with the patient and updated documentation appropriately.          Comments    1 Week s\p OS. FP  Pt states OS is doing well. Using gtts as directed.       Last edited by Tilda Franco on 05/05/2020  8:59 AM. (History)      Referring physician: Tamsen Roers, MD [place],  [place] [address]  HISTORICAL INFORMATION:   Selected notes from the MEDICAL RECORD NUMBER       CURRENT MEDICATIONS: Current Outpatient Medications (Ophthalmic Drugs)  Medication Sig  . ketorolac (ACULAR) 0.4 % SOLN Place into the right eye 2 (two) times daily.  Marland Kitchen ofloxacin (OCUFLOX) 0.3 % ophthalmic solution Place 1 drop into the left eye 4 (four) times daily for 21 days.  . prednisoLONE acetate (PRED FORTE) 1 % ophthalmic suspension Place 1 drop into the left eye 4 (four) times daily for 21 days.   No current facility-administered medications for this visit. (Ophthalmic Drugs)   Current Outpatient Medications (Other)  Medication Sig  . b complex vitamins tablet Take 1 tablet by mouth daily.  . baclofen (LIORESAL) 10 MG tablet SMARTSIG:Tablet(s) By Mouth Every 6-8 Hours PRN  . CAMBIA 50 MG PACK Reported on 09/23/2015  . diazepam (VALIUM) 5 MG tablet Take 5 mg by mouth every 6 (six) hours as needed for anxiety.  Marland Kitchen escitalopram (LEXAPRO) 5 MG tablet   . fexofenadine (ALLEGRA) 180 MG tablet Take by mouth.  . fluticasone (FLONASE) 50 MCG/ACT nasal spray Place into both nostrils daily.  Marland Kitchen ibuprofen (ADVIL,MOTRIN) 200 MG tablet Take 200 mg by mouth every 8 (eight) hours as needed. Reported on 09/23/2015  . magnesium gluconate (MAGONATE) 500 MG tablet Take by mouth.  .  METRONIDAZOLE, TOPICAL, 0.75 % LOTN Apply a thin coat to the entire face QHS  . Multiple Vitamins-Minerals (PRESERVISION AREDS PO) Take by mouth.  . ondansetron (ZOFRAN-ODT) 8 MG disintegrating tablet Take by mouth.  . Rimegepant Sulfate (NURTEC) 75 MG TBDP TAKE 1 TABLET AT ONSET OF HEADACHE. MAY REPEAT IN 24 HOURS IF NEEDED  . SUMAtriptan 6 MG/0.5ML SOAJ INJECT AT ONSET OF HEADACHE, MAY REPEAT IN 2 HOURS IF NEEDED BUT NO MORE THAN 2 IN 24 HOURS  . UNABLE TO FIND Marylou Flesher - OBC   No current facility-administered medications for this visit. (Other)      REVIEW OF SYSTEMS:    ALLERGIES Allergies  Allergen Reactions  . Sulfamethoxazole-Trimethoprim     REACTION: nausea; vomiting  . Topamax [Topiramate] Anxiety    PAST MEDICAL HISTORY Past Medical History:  Diagnosis Date  . Anxiety   . History of migraine headaches   . Posterior vitreous detachment of left eye 12/14/2019   Past Surgical History:  Procedure Laterality Date  . CHOLECYSTECTOMY OPEN    . LASIK    . LEEP      FAMILY HISTORY Family History  Problem Relation Age of Onset  . Cancer Mother   . Hypertension Father   . Hyperlipidemia Father   . Cancer Maternal Grandmother     SOCIAL HISTORY Social History  Tobacco Use  . Smoking status: Former Smoker    Quit date: 06/07/2011    Years since quitting: 8.9  . Smokeless tobacco: Never Used  Substance Use Topics  . Alcohol use: No  . Drug use: No         OPHTHALMIC EXAM: Base Eye Exam    Visual Acuity (Snellen - Linear)      Right Left   Dist [place] 20/25 -2 20/25       Tonometry (Tonopen, 9:03 AM)      Right Left   Pressure 11 19       Pupils      Pupils Dark Light Shape React APD   Right PERRL 4 3 Round Brisk None   Left PERRL 4 3 Round Brisk None       Neuro/Psych    Oriented x3: Yes   Mood/Affect: Normal       Dilation    Left eye: 1.0% Mydriacyl, 2.5% Phenylephrine @ 9:03 AM        Slit Lamp and Fundus Exam    External Exam       Right Left   External  Normal       Slit Lamp Exam      Right Left   Lids/Lashes  Normal   Conjunctiva/Sclera  White and quiet   Cornea  Clear   Anterior Chamber  Deep and quiet   Iris  Round and reactive   Lens  Centered posterior chamber intraocular lens   Anterior Vitreous  clear       Fundus Exam      Right Left   Posterior Vitreous  Vitrectomized, clear   Disc  Normal   C/D Ratio  0.25   Macula  Normal   Vessels  Normal   Periphery  no holes or tears          IMAGING AND PROCEDURES  Imaging and Procedures for 05/05/20  Color Fundus Photography Optos - OU - Both Eyes       Right Eye Progression has been stable. Disc findings include normal observations. Macula : normal observations. Vessels : normal observations. Periphery : normal observations.   Left Eye Progression has been stable. Disc findings include normal observations. Macula : normal observations. Vessels : normal observations. Periphery : normal observations.   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 Vitreous membranes and strands, left  H43.312 Color Fundus Photography Optos - OU - Both Eyes  2. Macular puckering, left eye  H35.372     1.  OS looks great, status post vitrectomy for vitreous membranes and strands.  We will continue to monitor symptoms at home. 2.  Patient released to full activity in 3 days.  3.  Ophthalmic Meds Ordered this visit:  No orders of the defined types were placed in this encounter.      Return in about 4 months (around 09/03/2020) for DILATE OU, COLOR FP.  Patient Instructions  Patient instructed to complete topical eye medications for the next 2 weeks, and not to  refill the medications  Patient to complete prednisolone acetate 1 drop left eye 4 times daily  Ofloxacin  1 drop left eye 4 times daily  Patient is allowed to return to full activity in 3 more days.  Patient is allowed to return to work as scheduled December 28    Explained the diagnoses, plan, and follow up with the patient and they expressed understanding.  Patient expressed understanding of the importance of proper follow up care.   Clent Demark Keefe Zawistowski M.D. Diseases & Surgery of the Retina and Vitreous Retina & Diabetic [place] 05/05/20     Abbreviations: M myopia (nearsighted); A astigmatism; H hyperopia (farsighted); P presbyopia; Mrx spectacle prescription;  CTL contact lenses; OD right eye; OS left eye; OU both eyes  XT exotropia; ET esotropia; PEK punctate epithelial keratitis; PEE punctate epithelial erosions; DES dry eye syndrome; MGD meibomian gland dysfunction; ATs artificial tears; PFAT's preservative free artificial tears; [place] nuclear sclerotic cataract; PSC posterior subcapsular cataract; ERM epi-retinal membrane; PVD posterior vitreous detachment; RD retinal detachment; DM diabetes mellitus; DR diabetic retinopathy; NPDR non-proliferative diabetic retinopathy; PDR proliferative diabetic retinopathy; CSME clinically significant macular edema; DME diabetic macular edema; dbh dot blot hemorrhages; CWS cotton wool spot; POAG primary open angle glaucoma; C/D cup-to-disc ratio; HVF humphrey visual field; GVF goldmann visual field; OCT optical coherence tomography; IOP intraocular pressure; BRVO Branch retinal vein occlusion; CRVO central retinal vein occlusion; CRAO central retinal artery occlusion; BRAO branch retinal artery occlusion; RT retinal tear; SB scleral buckle; PPV pars plana vitrectomy; VH Vitreous hemorrhage; PRP panretinal laser photocoagulation; IVK intravitreal kenalog; VMT vitreomacular traction; MH Macular hole;  NVD neovascularization of the disc; NVE  neovascularization elsewhere; AREDS age related eye disease study; ARMD age related macular degeneration; POAG primary open angle glaucoma; EBMD epithelial/anterior basement membrane dystrophy; ACIOL anterior chamber intraocular lens; IOL intraocular lens; PCIOL posterior chamber intraocular lens; Phaco/IOL phacoemulsification with intraocular lens placement; [place] photorefractive keratectomy; LASIK laser assisted in situ keratomileusis; HTN hypertension; DM diabetes mellitus; COPD chronic obstructive pulmonary disease

## 2020-05-05 NOTE — Patient Instructions (Signed)
Patient instructed to complete topical eye medications for the next 2 weeks, and not to refill the medications  Patient to complete prednisolone acetate 1 drop left eye 4 times daily  Ofloxacin 1 drop left eye 4 times daily  Patient is allowed to return to full activity in 3 more days.  Patient is allowed to return to work as scheduled December 28

## 2020-05-17 DIAGNOSIS — M47812 Spondylosis without myelopathy or radiculopathy, cervical region: Secondary | ICD-10-CM | POA: Diagnosis not present

## 2020-05-17 DIAGNOSIS — G43001 Migraine without aura, not intractable, with status migrainosus: Secondary | ICD-10-CM | POA: Diagnosis not present

## 2020-07-11 DIAGNOSIS — F32A Depression, unspecified: Secondary | ICD-10-CM | POA: Diagnosis not present

## 2020-07-11 DIAGNOSIS — E785 Hyperlipidemia, unspecified: Secondary | ICD-10-CM | POA: Diagnosis not present

## 2020-07-11 DIAGNOSIS — F419 Anxiety disorder, unspecified: Secondary | ICD-10-CM | POA: Diagnosis not present

## 2020-07-22 DIAGNOSIS — Z01419 Encounter for gynecological examination (general) (routine) without abnormal findings: Secondary | ICD-10-CM | POA: Diagnosis not present

## 2020-07-22 DIAGNOSIS — Z1231 Encounter for screening mammogram for malignant neoplasm of breast: Secondary | ICD-10-CM | POA: Diagnosis not present

## 2020-07-22 DIAGNOSIS — Z1389 Encounter for screening for other disorder: Secondary | ICD-10-CM | POA: Diagnosis not present

## 2020-07-22 DIAGNOSIS — N951 Menopausal and female climacteric states: Secondary | ICD-10-CM | POA: Diagnosis not present

## 2020-07-22 DIAGNOSIS — Z6826 Body mass index (BMI) 26.0-26.9, adult: Secondary | ICD-10-CM | POA: Diagnosis not present

## 2020-07-22 DIAGNOSIS — Z130 Encounter for screening for diseases of the blood and blood-forming organs and certain disorders involving the immune mechanism: Secondary | ICD-10-CM | POA: Diagnosis not present

## 2020-07-22 DIAGNOSIS — R8290 Unspecified abnormal findings in urine: Secondary | ICD-10-CM | POA: Diagnosis not present

## 2020-08-15 ENCOUNTER — Encounter (INDEPENDENT_AMBULATORY_CARE_PROVIDER_SITE_OTHER): Payer: Federal, State, Local not specified - PPO | Admitting: Ophthalmology

## 2020-09-05 ENCOUNTER — Ambulatory Visit (INDEPENDENT_AMBULATORY_CARE_PROVIDER_SITE_OTHER): Payer: Federal, State, Local not specified - PPO | Admitting: Ophthalmology

## 2020-09-05 ENCOUNTER — Encounter (INDEPENDENT_AMBULATORY_CARE_PROVIDER_SITE_OTHER): Payer: Self-pay | Admitting: Ophthalmology

## 2020-09-05 ENCOUNTER — Other Ambulatory Visit: Payer: Self-pay

## 2020-09-05 ENCOUNTER — Other Ambulatory Visit (INDEPENDENT_AMBULATORY_CARE_PROVIDER_SITE_OTHER): Payer: Self-pay | Admitting: Ophthalmology

## 2020-09-05 DIAGNOSIS — H35372 Puckering of macula, left eye: Secondary | ICD-10-CM | POA: Diagnosis not present

## 2020-09-05 DIAGNOSIS — H35371 Puckering of macula, right eye: Secondary | ICD-10-CM

## 2020-09-05 DIAGNOSIS — H353131 Nonexudative age-related macular degeneration, bilateral, early dry stage: Secondary | ICD-10-CM

## 2020-09-05 DIAGNOSIS — H35351 Cystoid macular degeneration, right eye: Secondary | ICD-10-CM

## 2020-09-05 DIAGNOSIS — G43109 Migraine with aura, not intractable, without status migrainosus: Secondary | ICD-10-CM

## 2020-09-05 DIAGNOSIS — H43312 Vitreous membranes and strands, left eye: Secondary | ICD-10-CM | POA: Diagnosis not present

## 2020-09-05 NOTE — Progress Notes (Signed)
09/05/2020     CHIEF COMPLAINT Patient presents for Retina Follow Up (4 Mo F/U OU//Pt c/o episode of migraine x 1 week ago with "black shape and white around the edges" OD, lasting a couple minutes per pt. Pt sts she has had a couple episodes over the weekend of ophthalmic migraine OS, with floaters and white edges. Pt reports intermittent floaters OU, OD>OS.)   HISTORY OF PRESENT ILLNESS: Christine Bishop is a 55 y.o. female who presents to the clinic today for:   HPI    Retina Follow Up    Patient presents with  Other.  In both eyes.  This started 4 months ago.  Severity is mild.  Duration of 4 months.  Since onset it is stable. Additional comments: 4 Mo F/U OU  Pt c/o episode of migraine x 1 week ago with "black shape and white around the edges" OD, lasting a couple minutes per pt. Pt sts she has had a couple episodes over the weekend of ophthalmic migraine OS, with floaters and white edges. Pt reports intermittent floaters OU, OD>OS.       Last edited by Rockie Neighbours, [place] on 09/05/2020  8:27 AM. (History)      Referring physician: Tamsen Roers, MD [place],  [place] [address]  HISTORICAL INFORMATION:   Selected notes from the MEDICAL RECORD NUMBER       CURRENT MEDICATIONS: Current Outpatient Medications (Ophthalmic Drugs)  Medication Sig  . ketorolac (ACULAR) 0.4 % SOLN Place into the right eye 2 (two) times daily.   No current facility-administered medications for this visit. (Ophthalmic Drugs)   Current Outpatient Medications (Other)  Medication Sig  . b complex vitamins tablet Take 1 tablet by mouth daily.  . baclofen (LIORESAL) 10 MG tablet SMARTSIG:Tablet(s) By Mouth Every 6-8 Hours PRN  . CAMBIA 50 MG PACK Reported on 09/23/2015  . diazepam (VALIUM) 5 MG tablet Take 5 mg by mouth every 6 (six) hours as needed for anxiety.  Marland Kitchen escitalopram (LEXAPRO) 5 MG tablet   . fexofenadine (ALLEGRA) 180 MG tablet Take by mouth.  . fluticasone (FLONASE) 50 MCG/ACT  nasal spray Place into both nostrils daily.  Marland Kitchen ibuprofen (ADVIL,MOTRIN) 200 MG tablet Take 200 mg by mouth every 8 (eight) hours as needed. Reported on 09/23/2015  . magnesium gluconate (MAGONATE) 500 MG tablet Take by mouth.  . METRONIDAZOLE, TOPICAL, 0.75 % LOTN Apply a thin coat to the entire face QHS  . Multiple Vitamins-Minerals (PRESERVISION AREDS PO) Take by mouth.  . ondansetron (ZOFRAN-ODT) 8 MG disintegrating tablet Take by mouth.  . Rimegepant Sulfate (NURTEC) 75 MG TBDP TAKE 1 TABLET AT ONSET OF HEADACHE. MAY REPEAT IN 24 HOURS IF NEEDED  . SUMAtriptan 6 MG/0.5ML SOAJ INJECT AT ONSET OF HEADACHE, MAY REPEAT IN 2 HOURS IF NEEDED BUT NO MORE THAN 2 IN 24 HOURS  . UNABLE TO FIND Marylou Flesher - OBC   No current facility-administered medications for this visit. (Other)      REVIEW OF SYSTEMS:    ALLERGIES Allergies  Allergen Reactions  . Sulfamethoxazole-Trimethoprim     REACTION: nausea; vomiting  . Topamax [Topiramate] Anxiety    PAST MEDICAL HISTORY Past Medical History:  Diagnosis Date  . Anxiety   . History of migraine headaches   . Posterior vitreous detachment of left eye 12/14/2019   Past Surgical History:  Procedure Laterality Date  . CHOLECYSTECTOMY OPEN    . LASIK    . LEEP  FAMILY HISTORY Family History  Problem Relation Age of Onset  . Cancer Mother   . Hypertension Father   . Hyperlipidemia Father   . Cancer Maternal Grandmother     SOCIAL HISTORY Social History   Tobacco Use  . Smoking status: Former Smoker    Quit date: 06/07/2011    Years since quitting: 9.2  . Smokeless tobacco: Never Used  Substance Use Topics  . Alcohol use: No  . Drug use: No         OPHTHALMIC EXAM:  Base Eye Exam    Visual Acuity (ETDRS)      Right Left   Dist cc 20/25 +1 20/25   Correction: Glasses       Tonometry (Tonopen, 8:30 AM)      Right Left   Pressure 10 11       Pupils      Pupils Dark Light Shape React APD   Right PERRL 7 6 Round  Brisk None   Left PERRL 7 6 Round Brisk None       Visual Fields (Counting fingers)      Left Right    Full Full       Extraocular Movement      Right Left    Full Full       Neuro/Psych    Oriented x3: Yes   Mood/Affect: Normal       Dilation    Both eyes: 1.0% Mydriacyl, 2.5% Phenylephrine @ 8:30 AM        Slit Lamp and Fundus Exam    External Exam      Right Left   External Normal Normal       Slit Lamp Exam      Right Left   Lids/Lashes Normal Normal   Conjunctiva/Sclera White and quiet White and quiet   Cornea Clear Clear   Anterior Chamber Deep and quiet Deep and quiet   Iris Round and reactive Round and reactive   Lens Centered posterior chamber intraocular lens Centered posterior chamber intraocular lens   Anterior Vitreous clear, vitrectomized clear       Fundus Exam      Right Left   Posterior Vitreous Vitrectomized Vitrectomized, clear   Disc Normal Normal   C/D Ratio 0.25 0.25   Macula Normal Normal   Vessels Normal Normal   Periphery Normal, no holes or tears no holes or tears          IMAGING AND PROCEDURES  Imaging and Procedures for 09/05/20  Color Fundus Photography Optos - OU - Both Eyes       Right Eye Progression has been stable. Disc findings include normal observations. Macula : normal observations. Vessels : normal observations. Periphery : normal observations.   Left Eye Progression has been stable. Disc findings include normal observations. Macula : normal observations. Vessels : normal observations. Periphery : normal observations.   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  On 2 occasions the left eye and on another 2 occasion the right eye.  No residual headache thereafter.  I explained to the patient that these symptoms paracentral with white centered visual perceptions are not ocular in origin but instead ophthalmic visual system, thus migrainous type events.  Nonexudative age-related macular degeneration, bilateral, early dry stage Stable no active disease      ICD-10-CM   1. Vitreous membranes and strands, left  H43.312 Color Fundus Photography Optos - OU - Both Eyes  2. Macular puckering, left eye  H35.372   3. Macular pucker, right eye  H35.371   4. Cystoid macular edema of right eye  H35.351   5. Ocular migraine  G43.109   6. Nonexudative age-related macular degeneration, bilateral, early dry stage  H35.3131     1.  Minor epiretinal membrane OU seen only on OCT with no distortion to the foveal region observed.  2.  Grain advance, no visual impact at this time.  May want to seek evaluation with her PCP  3.  Ophthalmic Meds Ordered this visit:  No orders of the defined types were placed in this encounter.      Return in about 1 year (around 09/05/2021) for DILATE OU, OCT.  There are no Patient Instructions on file for this visit.   Explained the diagnoses, plan, and follow up with the patient and they expressed understanding.  Patient expressed understanding of the importance of proper follow up care.   Clent Demark Kamdyn Colborn M.D. Diseases & Surgery of the Retina and Vitreous Retina & Diabetic [place] 09/05/20     Abbreviations: M myopia (nearsighted); A astigmatism; H hyperopia (farsighted); P presbyopia; Mrx spectacle prescription;  CTL contact lenses; OD right eye; OS left eye; OU both eyes  XT exotropia; ET esotropia; PEK punctate epithelial keratitis; PEE punctate  epithelial erosions; DES dry eye syndrome; MGD meibomian gland dysfunction; ATs artificial tears; PFAT's preservative free artificial tears; [place] nuclear sclerotic cataract; PSC posterior subcapsular cataract; ERM epi-retinal membrane; PVD posterior vitreous detachment; RD retinal detachment; DM diabetes mellitus; DR diabetic retinopathy; NPDR non-proliferative diabetic retinopathy; PDR proliferative diabetic retinopathy; CSME clinically significant macular edema; DME diabetic macular edema; dbh dot blot hemorrhages; CWS cotton wool spot; POAG primary open angle glaucoma; C/D cup-to-disc ratio; HVF humphrey visual field; GVF goldmann visual field; OCT optical coherence tomography; IOP intraocular pressure; BRVO Branch retinal vein occlusion; CRVO central retinal vein occlusion; CRAO central retinal artery occlusion; BRAO branch retinal artery occlusion; RT retinal tear; SB scleral buckle; PPV pars plana vitrectomy; VH Vitreous hemorrhage; PRP panretinal laser photocoagulation; IVK intravitreal kenalog; VMT vitreomacular traction; MH Macular hole;  NVD neovascularization of the disc; NVE neovascularization elsewhere; AREDS age related eye disease study; ARMD age related macular degeneration; POAG primary open angle glaucoma; EBMD epithelial/anterior basement membrane dystrophy; ACIOL anterior chamber intraocular lens; IOL intraocular lens; PCIOL posterior chamber intraocular lens; Phaco/IOL phacoemulsification with intraocular lens placement; [place] photorefractive keratectomy; LASIK laser assisted in situ keratomileusis; HTN hypertension; DM diabetes mellitus; COPD chronic obstructive pulmonary disease

## 2020-09-05 NOTE — Assessment & Plan Note (Signed)
Minor, no impact on acuity, no distortion to the fovea

## 2020-09-05 NOTE — Assessment & Plan Note (Signed)
Stable no active disease 

## 2020-09-05 NOTE — Assessment & Plan Note (Signed)
Patient describes central and paracentral white spots, with black at the bottom.  On 2 occasions the left eye and on another 2 occasion the right eye.  No residual headache thereafter.  I explained to the patient that these symptoms paracentral with white centered visual perceptions are not ocular in origin but instead ophthalmic visual system, thus migrainous type events.

## 2020-09-05 NOTE — Assessment & Plan Note (Signed)
Patient to continue on topical nonsteroidals right eye chronically to prevent recurrence of CME

## 2020-09-08 DIAGNOSIS — M5481 Occipital neuralgia: Secondary | ICD-10-CM | POA: Diagnosis not present

## 2020-09-18 IMAGING — RF DG FLUORO GUIDE LUMBAR PUNCTURE
2 series · 2 of 2 positions shown · non-contrast
Comparison: none

Addendum:
CLINICAL DATA: 51-year-old female with lower extremity paresthesias
and nonspecific cerebral white matter signal changes on brain MRI
earlier this month.

[Series 1: cp_standard · 0.19mm/px · 1 of 1 slices shown (1 of 2)]
[im 1/1]
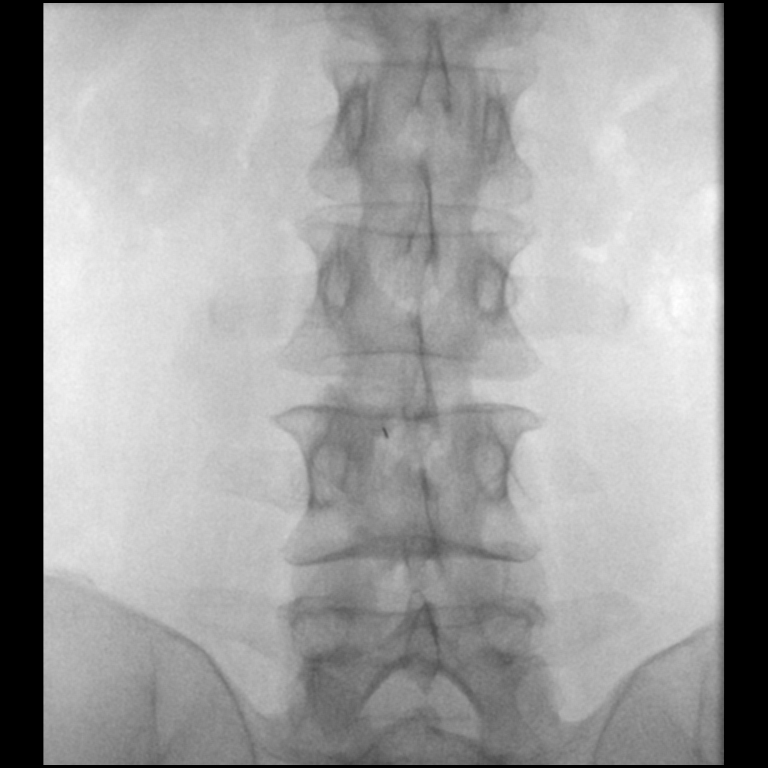

[Series 2: cp_standard · 0.10mm/px · 1 of 1 slices shown (2 of 2)]
[im 1/1]
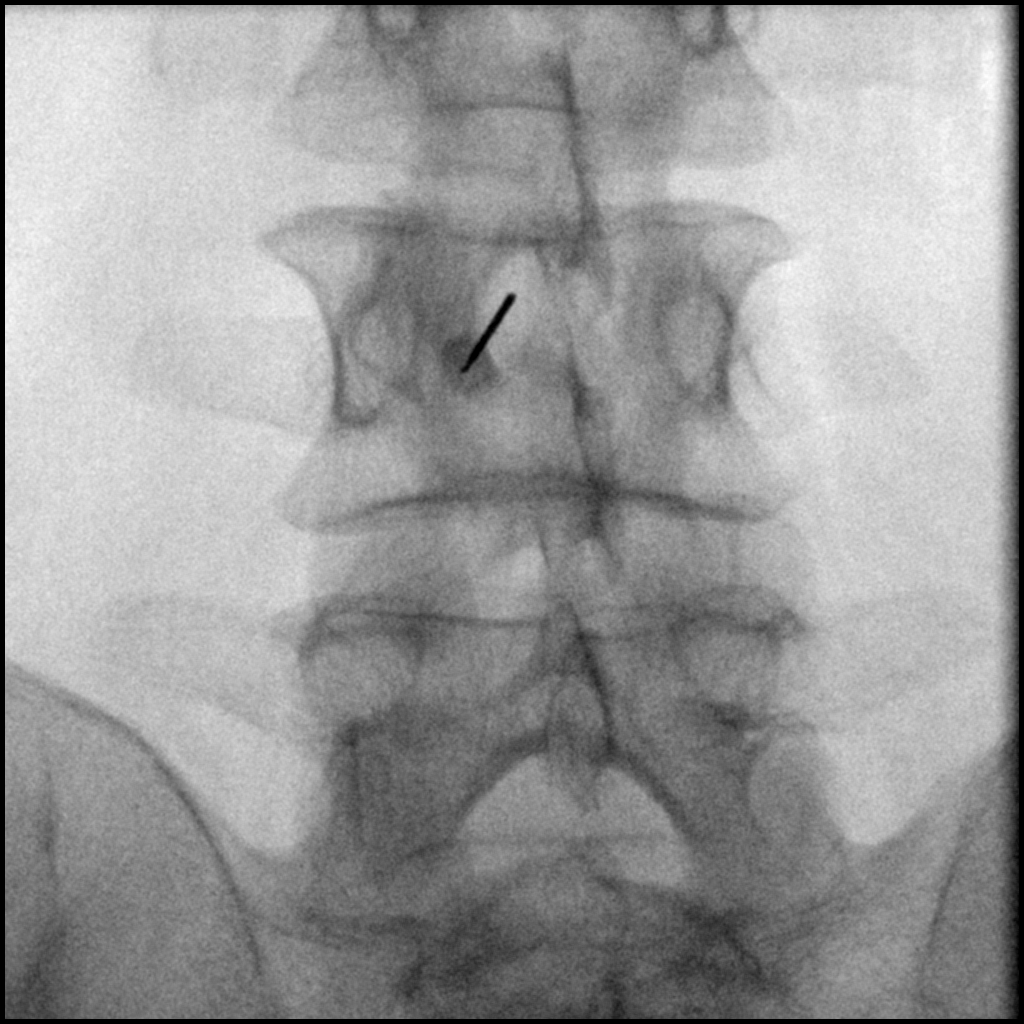

[2 of 2 positions shown; findings below may reference images not displayed]

Diagnostic lumbar puncture requested.

EXAM:
DIAGNOSTIC LUMBAR PUNCTURE UNDER FLUOROSCOPIC GUIDANCE

FLUOROSCOPY TIME:  Fluoroscopy Time:  0 minutes 12 seconds

Radiation Exposure Index (if provided by the fluoroscopic device):
0.9 mGy

Number of Acquired Spot Images: 0

PROCEDURE:
Informed consent was obtained from the patient prior to the
procedure, including potential complications of headache, allergy,
and pain. A "time-out" was performed.

With the patient prone, the lower back was prepped with Betadine. 1%
Lidocaine was used for local anesthesia. Lumbar puncture was
performed at the L3-L4 level using left sub laminar technique, and a
3.5 in x 20 gauge needle with return of clear, colorless CSF.

Approximately 17 mL of CSF were obtained for laboratory studies. The
patient tolerated the procedure well and there were no apparent
complications.
IMPRESSION: Fluoroscopic guided lumbar puncture at L3-L4. Approximately 17 mL of
clear CSF were obtained for laboratory studies.

ADDENDUM:
Study discussed by telephone with [facility] on 04/22/2018 at
6405 hours.

*** End of Addendum ***

## 2020-10-03 DIAGNOSIS — F410 Panic disorder [episodic paroxysmal anxiety] without agoraphobia: Secondary | ICD-10-CM | POA: Diagnosis not present

## 2020-10-03 DIAGNOSIS — J302 Other seasonal allergic rhinitis: Secondary | ICD-10-CM | POA: Diagnosis not present

## 2020-10-03 DIAGNOSIS — F419 Anxiety disorder, unspecified: Secondary | ICD-10-CM | POA: Diagnosis not present

## 2020-10-24 DIAGNOSIS — Z79890 Hormone replacement therapy (postmenopausal): Secondary | ICD-10-CM | POA: Diagnosis not present

## 2020-11-15 DIAGNOSIS — G43001 Migraine without aura, not intractable, with status migrainosus: Secondary | ICD-10-CM | POA: Diagnosis not present

## 2020-11-15 DIAGNOSIS — M47812 Spondylosis without myelopathy or radiculopathy, cervical region: Secondary | ICD-10-CM | POA: Diagnosis not present

## 2020-11-24 DIAGNOSIS — N3090 Cystitis, unspecified without hematuria: Secondary | ICD-10-CM | POA: Diagnosis not present

## 2021-01-03 DIAGNOSIS — Z733 Stress, not elsewhere classified: Secondary | ICD-10-CM | POA: Diagnosis not present

## 2021-01-03 DIAGNOSIS — F419 Anxiety disorder, unspecified: Secondary | ICD-10-CM | POA: Diagnosis not present

## 2021-01-03 DIAGNOSIS — F410 Panic disorder [episodic paroxysmal anxiety] without agoraphobia: Secondary | ICD-10-CM | POA: Diagnosis not present

## 2021-01-09 ENCOUNTER — Ambulatory Visit: Payer: Federal, State, Local not specified - PPO | Admitting: Dermatology

## 2021-01-09 ENCOUNTER — Other Ambulatory Visit: Payer: Self-pay

## 2021-01-09 DIAGNOSIS — Z1283 Encounter for screening for malignant neoplasm of skin: Secondary | ICD-10-CM | POA: Diagnosis not present

## 2021-01-09 DIAGNOSIS — L578 Other skin changes due to chronic exposure to nonionizing radiation: Secondary | ICD-10-CM | POA: Diagnosis not present

## 2021-01-09 DIAGNOSIS — Z86018 Personal history of other benign neoplasm: Secondary | ICD-10-CM

## 2021-01-09 DIAGNOSIS — D229 Melanocytic nevi, unspecified: Secondary | ICD-10-CM

## 2021-01-09 DIAGNOSIS — L719 Rosacea, unspecified: Secondary | ICD-10-CM | POA: Diagnosis not present

## 2021-01-09 DIAGNOSIS — L814 Other melanin hyperpigmentation: Secondary | ICD-10-CM

## 2021-01-09 DIAGNOSIS — D1800 Hemangioma unspecified site: Secondary | ICD-10-CM

## 2021-01-09 DIAGNOSIS — L820 Inflamed seborrheic keratosis: Secondary | ICD-10-CM | POA: Diagnosis not present

## 2021-01-09 DIAGNOSIS — L821 Other seborrheic keratosis: Secondary | ICD-10-CM

## 2021-01-09 MED ORDER — METRONIDAZOLE 0.75 % EX LOTN: TOPICAL_LOTION | CUTANEOUS | 11 refills | Status: DC

## 2021-01-09 NOTE — Patient Instructions (Addendum)
Seborrheic Keratosis  What causes seborrheic keratoses? Seborrheic keratoses are harmless, common skin growths that first appear during adult life.  As time goes by, more growths appear.  Some people may develop a large number of them.  Seborrheic keratoses appear on both covered and uncovered body parts.  They are not caused by sunlight.  The tendency to develop seborrheic keratoses can be inherited.  They vary in color from skin-colored to gray, brown, or even black.  They can be either smooth or have a rough, warty surface.   Seborrheic keratoses are superficial and look as if they were stuck on the skin.  Under the microscope this type of keratosis looks like layers upon layers of skin.  That is why at times the top layer may seem to fall off, but the rest of the growth remains and re-grows.    Treatment Seborrheic keratoses do not need to be treated, but can easily be removed in the office.  Seborrheic keratoses often cause symptoms when they rub on clothing or jewelry.  Lesions can be in the way of shaving.  If they become inflamed, they can cause itching, soreness, or burning.  Removal of a seborrheic keratosis can be accomplished by freezing, burning, or surgery. If any spot bleeds, scabs, or grows rapidly, please return to have it checked, as these can be an indication of a skin cancer.  Cryotherapy Aftercare  Wash gently with soap and water everyday.   Apply Vaseline and Band-Aid daily until healed.   Melanoma ABCDEs  Melanoma is the most dangerous type of skin cancer, and is the leading cause of death from skin disease.  You are more likely to develop melanoma if you: Have light-colored skin, light-colored eyes, or red or blond hair Spend a lot of time in the sun Tan regularly, either outdoors or in a tanning bed Have had blistering sunburns, especially during childhood Have a close family member who has had a melanoma Have atypical moles or large birthmarks  Early detection of  melanoma is key since treatment is typically straightforward and cure rates are extremely high if we catch it early.   The first sign of melanoma is often a change in a mole or a new dark spot.  The ABCDE system is a way of remembering the signs of melanoma.  A for asymmetry:  The two halves do not match. B for border:  The edges of the growth are irregular. C for color:  A mixture of colors are present instead of an even brown color. D for diameter:  Melanomas are usually (but not always) greater than 1m - the size of a pencil eraser. E for evolution:  The spot keeps changing in size, shape, and color.  Please check your skin once per month between visits. You can use a small mirror in front and a large mirror behind you to keep an eye on the back side or your body.   If you see any new or changing lesions before your next follow-up, please call to schedule a visit.  Please continue daily skin protection including broad spectrum sunscreen SPF 30+ to sun-exposed areas, reapplying every 2 hours as needed when you're outdoors.   Staying in the shade or wearing long sleeves, sun glasses (UVA+UVB protection) and wide brim hats (4-inch brim around the entire circumference of the hat) are also recommended for sun protection.    If you have any questions or concerns for your doctor, please call our main line at [phone]  and press option 4 to reach your doctor's medical assistant. If no one answers, please leave a voicemail as directed and we will return your call as soon as possible. Messages left after 4 pm will be answered the following business day.   You may also send us a message via MyChart. We typically respond to MyChart messages within 1-2 business days.  For prescription refills, please ask your pharmacy to contact our office. Our fax number is [phone].  If you have an urgent issue when the clinic is closed that cannot wait until the next business day, you can page your doctor at  the number below.    Please note that while we do our best to be available for urgent issues outside of office hours, we are not available 24/7.   If you have an urgent issue and are unable to reach us, you may choose to seek medical care at your doctor's office, retail clinic, urgent care center, or emergency room.  If you have a medical emergency, please immediately call 911 or go to the emergency department.  Pager Numbers  - Dr. Kowalski: [phone]  - Dr. Moye: [phone]  - Dr. Stewart: [phone]  In the event of inclement weather, please call our main line at [phone] for an update on the status of any delays or closures.  Dermatology Medication Tips: Please keep the boxes that topical medications come in in order to help keep track of the instructions about where and how to use these. Pharmacies typically print the medication instructions only on the boxes and not directly on the medication tubes.   If your medication is too expensive, please contact our office at [phone] option 4 or send us a message through MyChart.   We are unable to tell what your co-pay for medications will be in advance as this is different depending on your insurance coverage. However, we may be able to find a substitute medication at lower cost or fill out paperwork to get insurance to cover a needed medication.   If a prior authorization is required to get your medication covered by your insurance company, please allow us 1-2 business days to complete this process.  Drug prices often vary depending on where the prescription is filled and some pharmacies may offer cheaper prices.  The website www.goodrx.com contains coupons for medications through different pharmacies. The prices here do not account for what the cost may be with help from insurance (it may be cheaper with your insurance), but the website can give you the price if you did not use any insurance.  - You can print the  associated coupon and take it with your prescription to the pharmacy.  - You may also stop by our office during regular business hours and pick up a GoodRx coupon card.  - If you need your prescription sent electronically to a different pharmacy, notify our office through  MyChart or by phone at [phone] option 4.  

## 2021-01-09 NOTE — Progress Notes (Signed)
Follow-Up Visit   Subjective  Christine Bishop is a 55 y.o. female who presents for the following: Annual Exam (Patient here today for annual exam. Patient here today for concerns with spot at left forearm. ). Patient here for full body skin exam and skin cancer screening.  The following portions of the chart were reviewed this encounter and updated as appropriate:  Tobacco  Allergies  Meds  Problems  Med Hx  Surg Hx  Fam Hx     Objective  Well appearing patient in no apparent distress; mood and affect are within normal limits.  A full examination was performed including scalp, head, eyes, ears, nose, lips, neck, chest, axillae, abdomen, back, buttocks, bilateral upper extremities, bilateral lower extremities, hands, feet, fingers, toes, fingernails, and toenails. All findings within normal limits unless otherwise noted below.  Left Forearm x 1, left ear crus x 1 (2) Erythematous keratotic or waxy stuck-on papule or plaque.   Assessment & Plan  Rosacea Head - Anterior (Face) Rosacea is a chronic progressive skin condition usually affecting the face of adults, causing redness and/or acne bumps. It is treatable but not curable. It sometimes affects the eyes (ocular rosacea) as well. It may respond to topical and/or systemic medication and can flare with stress, sun exposure, alcohol, exercise and some foods.  Daily application of broad spectrum spf 30+ sunscreen to face is recommended to reduce flares.  Continue metronidazole topical 0.75 % lotn apply to thin coat to the entire face nightly.   Related Medications METRONIDAZOLE, TOPICAL, 0.75 % LOTN Apply a thin coat to the entire face QHS  Inflamed seborrheic keratosis Left Forearm x 1, left ear crus x 1 Patient instructed to call us if spot at left ear doesn't go away in 6 weeks   Destruction of lesion - Left Forearm x 1, left ear crus x 1 Complexity: simple   Destruction method: cryotherapy   Informed consent: discussed  and consent obtained   Timeout:  patient name, date of birth, surgical site, and procedure verified Lesion destroyed using liquid nitrogen: Yes   Region frozen until ice ball extended beyond lesion: Yes   Outcome: patient tolerated procedure well with no complications   Post-procedure details: wound care instructions given    Lentigines - Scattered tan macules - Due to sun exposure - Benign-appering, observe - Recommend daily broad spectrum sunscreen SPF 30+ to sun-exposed areas, reapply every 2 hours as needed. - Call for any changes  Seborrheic Keratoses - Stuck-on, waxy, tan-brown papules and/or plaques  - Benign-appearing - Discussed benign etiology and prognosis. - Observe - Call for any changes  Melanocytic Nevi - Tan-brown and/or pink-flesh-colored symmetric macules and papules - Benign appearing on exam today - Observation - Call clinic for new or changing moles - Recommend daily use of broad spectrum spf 30+ sunscreen to sun-exposed areas.   Hemangiomas - Red papules - Discussed benign nature - Observe - Call for any changes  Actinic Damage - Chronic condition, secondary to cumulative UV/sun exposure - diffuse scaly erythematous macules with underlying dyspigmentation - Recommend daily broad spectrum sunscreen SPF 30+ to sun-exposed areas, reapply every 2 hours as needed.  - Staying in the shade or wearing long sleeves, sun glasses (UVA+UVB protection) and wide brim hats (4-inch brim around the entire circumference of the hat) are also recommended for sun protection.  - Call for new or changing lesions.  History of Dysplastic Nevi - No evidence of recurrence today right upper back paraspinal , right  mid upper back 3 cm lat to spine above braline (2021) - Recommend regular full body skin exams - Recommend daily broad spectrum sunscreen SPF 30+ to sun-exposed areas, reapply every 2 hours as needed.  - Call if any new or changing lesions are noted between office  visits  Skin cancer screening performed today.  Return in about 1 year (around 01/09/2022) for tbse. IRuthell Rummage, CMA, am acting as scribe for Sarina Ser, MD. Documentation: I have reviewed the above documentation for accuracy and completeness, and I agree with the above.  Sarina Ser, MD

## 2021-01-10 ENCOUNTER — Encounter: Payer: Self-pay | Admitting: Dermatology

## 2021-03-30 DIAGNOSIS — M5481 Occipital neuralgia: Secondary | ICD-10-CM | POA: Diagnosis not present

## 2021-04-03 DIAGNOSIS — F419 Anxiety disorder, unspecified: Secondary | ICD-10-CM | POA: Diagnosis not present

## 2021-04-03 DIAGNOSIS — F32A Depression, unspecified: Secondary | ICD-10-CM | POA: Diagnosis not present

## 2021-05-19 DIAGNOSIS — G43001 Migraine without aura, not intractable, with status migrainosus: Secondary | ICD-10-CM | POA: Diagnosis not present

## 2021-05-22 DIAGNOSIS — J029 Acute pharyngitis, unspecified: Secondary | ICD-10-CM | POA: Diagnosis not present

## 2021-05-22 DIAGNOSIS — H60512 Acute actinic otitis externa, left ear: Secondary | ICD-10-CM | POA: Diagnosis not present

## 2021-05-22 DIAGNOSIS — J019 Acute sinusitis, unspecified: Secondary | ICD-10-CM | POA: Diagnosis not present

## 2021-05-30 DIAGNOSIS — N393 Stress incontinence (female) (male): Secondary | ICD-10-CM | POA: Diagnosis not present

## 2021-05-30 DIAGNOSIS — N950 Postmenopausal bleeding: Secondary | ICD-10-CM | POA: Diagnosis not present

## 2021-05-30 DIAGNOSIS — R159 Full incontinence of feces: Secondary | ICD-10-CM | POA: Diagnosis not present

## 2021-06-15 ENCOUNTER — Other Ambulatory Visit: Payer: Self-pay

## 2021-06-15 ENCOUNTER — Ambulatory Visit: Payer: Federal, State, Local not specified - PPO | Attending: Obstetrics and Gynecology | Admitting: Physical Therapy

## 2021-06-15 ENCOUNTER — Encounter: Payer: Self-pay | Admitting: Physical Therapy

## 2021-06-15 DIAGNOSIS — N393 Stress incontinence (female) (male): Secondary | ICD-10-CM | POA: Insufficient documentation

## 2021-06-15 DIAGNOSIS — R279 Unspecified lack of coordination: Secondary | ICD-10-CM

## 2021-06-15 DIAGNOSIS — M6281 Muscle weakness (generalized): Secondary | ICD-10-CM

## 2021-06-15 DIAGNOSIS — M62838 Other muscle spasm: Secondary | ICD-10-CM

## 2021-06-15 NOTE — Therapy (Signed)
[facility] [address] [place], [place], [address] Phone: [phone]   Fax:  [phone]  Physical Therapy Evaluation  Patient Details  Name: Christine Bishop MRN: [id number] Date of Birth: Jan 04, 1966 Referring Provider (PT): Janyth Contes, MD   Encounter Date: 06/15/2021   PT End of Session - 06/15/21 0846     Visit Number 1    Date for PT Re-Evaluation 09/07/21    Authorization Type BCBS    PT Start Time 0801    PT Stop Time 0845    PT Time Calculation (min) 44 min    Activity Tolerance Patient tolerated treatment well    Behavior During Therapy [facility] for tasks assessed/performed             Past Medical History:  Diagnosis Date   Anxiety    Dysplastic nevus 12/24/2019   R upper back paraspinal - moderate   Dysplastic nevus 12/24/2019   R to mid upper back 3.0 cm lat to spine above braline - mild   History of migraine headaches    Posterior vitreous detachment of left eye 12/14/2019    Past Surgical History:  Procedure Laterality Date   CHOLECYSTECTOMY OPEN     LASIK     LEEP      There were no vitals filed for this visit.    Subjective Assessment - 06/15/21 0805     Subjective SUI and sometimes leak without knowing, and sometimes have fecal leakage maybe a couple times/week. Pt just got up and had leakage at lunch.  Pt has more pasty stool when it leaks.  Leaking has been going on for years.  I wear 1 pad/day    Pertinent History galbladder removal, 4th deg tear during vaginal delivery    Patient Stated Goals stop having leakage    Currently in Pain? No/denies                [facility] PT Assessment - 06/15/21 0001       Assessment   Medical Diagnosis N39.3 (ICD-10-CM) - Stress incontinence (female) (female)    Referring Provider (PT) Bovard-Stuckert, Jody, MD    Onset Date/Surgical Date --   10+ years   Prior Therapy No      Precautions   Precautions None      Balance Screen    Has the patient fallen in the past 6 months No      [place] residence    Living Arrangements Spouse/significant other;Children      Prior Function   Level of Independence Independent    Vocation Full time employment      Cognition   Overall Cognitive Status Within Functional Limits for tasks assessed      Functional Tests   Functional tests Single leg stance      Single Leg Stance   Comments Rt trendelenburg      Posture/Postural Control   Posture/Postural Control No significant limitations      ROM / Strength   AROM / PROM / Strength AROM;PROM;Strength      AROM   Overall AROM Comments lumbar flexion 75%      PROM   Overall PROM Comments Rt hip ER/IR      Strength   Overall Strength Comments Rt hip abduction/adduction 4-/5; Lt hip abduction 4/5      Flexibility   Soft Tissue Assessment /Muscle Length yes    Hamstrings Rt 50%; Lt 65%  Palpation   SI assessment  lumbar rotates left; Lt ilium rotates fwd    Palpation comment gluteals and lumbar tight      Ambulation/Gait   Gait Pattern Within Functional Limits                        Objective measurements completed on examination: See above findings.     Pelvic Floor Special Questions - 06/15/21 0001     Are you Pregnant or attempting pregnancy? --   menopausal and on HRT   Prior Pregnancies Yes    Number of Pregnancies 2    Number of Vaginal Deliveries 2    Any difficulty with labor and deliveries Yes   foreceps and episiotomy   Currently Sexually Active Yes    Marinoff Scale pain prevents any attempts at intercourse    Urinary Leakage Yes    How often almost daily    Pad use 1/day    Activities that cause leaking With strong urge;Coughing;Sneezing    Urinary frequency every 1-2 hours    Fecal incontinence Yes    Fluid intake 10 glasses /day    Falling out feeling (prolapse) No    Skin Integrity Intact   decreased rugea   Perineal  Body/Introitus  Descended    Pelvic Floor Internal Exam pt identity confirmed and consent given    Exam Type Vaginal    Palpation levators tight Rt>Lt; slow to relax and breath holding when contracting    Strength fair squeeze, definite lift    Strength # of reps 3   quick   Strength # of seconds 9    Tone slightly elevated            No emotional/communication barriers or cognitive limitation. Patient is motivated to learn. Patient understands and agrees with treatment goals and plan. PT explains patient will be examined in standing, sitting, and lying down to see how their muscles and joints work. When they are ready, they will be asked to remove their underwear so PT can examine their perineum. The patient is also given the option of providing their own chaperone as one is not provided in our facility. The patient also has the right and is explained the right to defer or refuse any part of the evaluation or treatment including the internal exam. With the patient's consent, PT will use one gloved finger to gently assess the muscles of the pelvic floor, seeing how well it contracts and relaxes and if there is muscle symmetry. After, the patient will get dressed and PT and patient will discuss exam findings and plan of care. PT and patient discuss plan of care, schedule, attendance policy and HEP activities.   [facility] Adult PT Treatment/Exercise - 06/15/21 0001       Self-Care   Self-Care Other Self-Care Comments    Other Self-Care Comments  intial HEP                     PT Education - 06/15/21 0845     Education Details Access Code: [email or web address]    Person(s) Educated Patient    Methods Explanation;Demonstration;Tactile cues;Handout;Verbal cues    Comprehension Verbalized understanding;Returned demonstration              PT Short Term Goals - 06/15/21 7846       PT SHORT TERM GOAL #1   Title Pt will be ind with diaphragmatic breathing    Time 4  Period Weeks     Status New    Target Date 07/13/21      PT SHORT TERM GOAL #2   Title Pt will be ind with initial HEP    Time 4    Period Weeks    Status New    Target Date 07/13/21      PT SHORT TERM GOAL #3   Title Pt will be able to exhale with exertion while coordinating the pelvic floor    Time 4    Period Weeks    Status New    Target Date 07/13/21               PT Long Term Goals - 06/15/21 0854       PT LONG TERM GOAL #1   Title Pt will report not having any leakage that she is unaware of due to improved muscle tone and sensation    Time 12    Period Weeks    Status New    Target Date 09/07/21      PT LONG TERM GOAL #2   Title Pt will be ind with advanced HEP    Time 12    Period Weeks    Status New    Target Date 09/07/21      PT LONG TERM GOAL #3   Title Pt will report no fecal incontinence    Time 12    Period Weeks    Status New    Target Date 09/07/21      PT LONG TERM GOAL #4   Title Pt will be able to cough and sneeze without leakage    Time 12    Period Weeks    Status New    Target Date 09/07/21      PT LONG TERM GOAL #5   Title Pt will have at least 75% less bladder leakage    Time 12    Period Weeks    Status New    Target Date 09/07/21                    Plan - 06/15/21 0859     Clinical Impression Statement Pt presents to skilled PT due to urinary and fecal incontinence that has been worsening over 10 years.  Pt has comorbidities as mentioned below.  Pt has 3/5 pelvic floor MMT and bil LE weakness. Pt has decreased mobility due to tight h/s and gluteal muscles and hypomobility in her spine due to ankylosing spondilitis.  Pt was holding her breath when doing kegels during assessment due to lack of coordination of the core and pelvic muscles.  Pt can do 3 quick  flicks and 9 sec hold demonstrating decreased edurance of the pelvic floor.  There is increased tension in her levators and she does report pain with intercourse that is  positional.  Pt will benefit from skilled PT to address these impairments so she can perform functional activities without leakage    Personal Factors and Comorbidities Comorbidity 3+    Comorbidities ankylosing spondilitis, glabladder removal, grade 4 pelvic tear during childbirth    Examination-Activity Limitations Continence;Toileting   coughing/sneezing   Examination-Participation Restrictions Community Activity;Interpersonal Relationship;Occupation    Stability/Clinical Decision Making Evolving/Moderate complexity    Clinical Decision Making Moderate    Rehab Potential Excellent    PT Frequency 1x / week    PT Duration 12 weeks    PT Treatment/Interventions ADLs/Self Care Home Management;Biofeedback;Cryotherapy;Moist Heat;Electrical Stimulation;Therapeutic activities;Therapeutic exercise;Neuromuscular re-education;Patient/family education;Manual  techniques;Passive range of motion;Dry needling;Taping    PT Next Visit Plan f/u on stretches, lumbar mobs, thoracic mob, diaphragmatic breathing and teach knack and pelvic floor isolation    PT Home Exercise Plan Access Code: [email or web address]    Consulted and Agree with Plan of Care Patient             Patient will benefit from skilled therapeutic intervention in order to improve the following deficits and impairments:  Pain, Impaired flexibility, Decreased strength, Decreased coordination, Decreased endurance, Increased muscle spasms, Decreased range of motion  Visit Diagnosis: Unspecified lack of coordination  Other muscle spasm  Muscle weakness (generalized)     Problem List Patient Active Problem List   Diagnosis Date Noted   Ocular migraine 09/05/2020   Postoperative follow-up 04/28/2020   Vitreous membranes and strands, left 04/20/2020   Macular pucker, right eye 12/14/2019   Cystoid macular edema of right eye 12/14/2019   Nonexudative age-related macular degeneration, bilateral, early dry stage 12/14/2019   Macular puckering,  left eye 12/14/2019   MIGRAINE HEADACHE 08/06/2010    Jule Ser, PT 06/15/2021, 9:18 AM  [facility] @ [address] [place], [place], [address] Phone: [phone]   Fax:  [phone]  Name: Christine Bishop MRN: [id number] Date of Birth: 1966/04/14

## 2021-06-15 NOTE — Patient Instructions (Signed)
Access Code: [email or web address] URL: [email or web address] Date: 06/15/2021 Prepared by: Jari Favre  Exercises Seated Hamstring Stretch - 1 x daily - 7 x weekly - 1 sets - 3 reps - 30 sec hold Seated Piriformis Stretch with Trunk Bend - 1 x daily - 7 x weekly - 1 sets - 3 reps - 30 sec hold Seated Piriformis Stretch - 1 x daily - 7 x weekly - 1 sets - 3 reps - 30 hold Supine Pelvic Floor Stretch - Hands on Knees - 1 x daily - 7 x weekly - 1 sets - 3 reps - 30 hold

## 2021-06-19 ENCOUNTER — Encounter: Payer: Self-pay | Admitting: Nurse Practitioner

## 2021-06-19 ENCOUNTER — Ambulatory Visit: Payer: Federal, State, Local not specified - PPO | Admitting: Nurse Practitioner

## 2021-06-19 ENCOUNTER — Other Ambulatory Visit: Payer: Self-pay

## 2021-06-19 VITALS — BP 109/72 | HR 82 | Temp 98.1°F | Ht 66.0 in | Wt 170.8 lb

## 2021-06-19 DIAGNOSIS — F3164 Bipolar disorder, current episode mixed, severe, with psychotic features: Secondary | ICD-10-CM | POA: Diagnosis present

## 2021-06-19 DIAGNOSIS — E569 Vitamin deficiency, unspecified: Secondary | ICD-10-CM

## 2021-06-19 DIAGNOSIS — Z7689 Persons encountering health services in other specified circumstances: Secondary | ICD-10-CM | POA: Diagnosis not present

## 2021-06-19 DIAGNOSIS — F321 Major depressive disorder, single episode, moderate: Secondary | ICD-10-CM

## 2021-06-19 DIAGNOSIS — Z0000 Encounter for general adult medical examination without abnormal findings: Secondary | ICD-10-CM

## 2021-06-19 DIAGNOSIS — F5101 Primary insomnia: Secondary | ICD-10-CM

## 2021-06-19 DIAGNOSIS — G4700 Insomnia, unspecified: Secondary | ICD-10-CM | POA: Insufficient documentation

## 2021-06-19 DIAGNOSIS — Z6827 Body mass index (BMI) 27.0-27.9, adult: Secondary | ICD-10-CM | POA: Diagnosis not present

## 2021-06-19 DIAGNOSIS — E039 Hypothyroidism, unspecified: Secondary | ICD-10-CM

## 2021-06-19 DIAGNOSIS — G43809 Other migraine, not intractable, without status migrainosus: Secondary | ICD-10-CM

## 2021-06-19 MED ORDER — TRAZODONE HCL 50 MG PO TABS: 25.0000 mg | ORAL_TABLET | Freq: Every evening | ORAL | 3 refills | Status: DC | PRN

## 2021-06-19 MED ORDER — ESCITALOPRAM OXALATE 20 MG PO TABS: 20.0000 mg | ORAL_TABLET | Freq: Every day | ORAL | 3 refills | Status: DC

## 2021-06-19 NOTE — Progress Notes (Signed)
New Patient Office Visit  Subjective:  Patient ID: Christine Bishop, female    DOB: 03/24/1966  Age: 56 y.o. MRN: [id number]  CC:  Chief Complaint  Patient presents with   New Patient (Initial Visit)    HPI [name] presents to establish new primary care provider. She was seeing Dr. Rex Kras, [facility], which has shut down. She states that Dr. Rex Kras has started her on medication for cholesterol back in 2021 and levels have not been checked since then. She is also taking lexapro 20mg . Dr. Rex Kras also stated her on diazepam 5mg  to to take as needed for severe anxiety and worry. She states that she doesn't even take this daily. Takes it only when she has a very bad day at work. She has problems with insomnia. This has been a problem for years. She states they were going to start medication to help her to sleep, however, the office closed down before then.  She does have long history of migraine headaches. Is seeing neurologist to help manage these.   Past Medical History:  Diagnosis Date   Anxiety    Dysplastic nevus 12/24/2019   R upper back paraspinal - moderate   Dysplastic nevus 12/24/2019   R to mid upper back 3.0 cm lat to spine above braline - mild   History of migraine headaches    Posterior vitreous detachment of left eye 12/14/2019    Past Surgical History:  Procedure Laterality Date   CHOLECYSTECTOMY OPEN     LASIK     LEEP      Family History  Problem Relation Age of Onset   Cancer Mother    Hypertension Father    Hyperlipidemia Father    Cancer Maternal Grandmother     Social History   Socioeconomic History   Marital status: Married    Spouse name: Not on file   Number of children: Not on file   Years of education: Not on file   Highest education level: Not on file  Occupational History   Not on file  Tobacco Use   Smoking status: Former    Types: Cigarettes    Quit date: 06/07/2011    Years since quitting: 10.0   Smokeless  tobacco: Never  Substance and Sexual Activity   Alcohol use: No   Drug use: No   Sexual activity: Yes  Other Topics Concern   Not on file  Social History Narrative   Not on file   Social Determinants of Health   Financial Resource Strain: Not on file  Food Insecurity: Not on file  Transportation Needs: Not on file  Physical Activity: Not on file  Stress: Not on file  Social Connections: Not on file  Intimate Partner Violence: Not on file    ROS Review of Systems  Constitutional:  Positive for fatigue. Negative for activity change, appetite change, chills and fever.  HENT:  Negative for congestion, postnasal drip, rhinorrhea, sinus pressure, sinus pain, sneezing and sore throat.   Eyes: Negative.   Respiratory:  Negative for cough, chest tightness, shortness of breath and wheezing.   Cardiovascular:  Negative for chest pain and palpitations.  Gastrointestinal:  Negative for abdominal pain, constipation, diarrhea, nausea and vomiting.  Endocrine: Negative for cold intolerance, heat intolerance, polydipsia and polyuria.  Genitourinary:  Positive for dysuria, frequency and urgency. Negative for dyspareunia and flank pain.       Currently going to physical therapy for overactive bladder.   Musculoskeletal:  Negative  for arthralgias, back pain and myalgias.  Skin:  Negative for rash.  Allergic/Immunologic: Negative for environmental allergies.  Neurological:  Positive for headaches. Negative for dizziness and weakness.  Hematological:  Negative for adenopathy.  Psychiatric/Behavioral:  Positive for sleep disturbance. The patient is nervous/anxious.    Objective:   Today's Vitals   06/19/21 1029  BP: 109/72  Pulse: 82  Temp: 98.1 F (36.7 C)  SpO2: 96%  Weight: 170 lb 12.8 oz (77.5 kg)   Body mass index is 27.57 kg/m.   Physical Exam Vitals and nursing note reviewed.  Constitutional:      Appearance: Normal appearance. She is well-developed.  HENT:     Head:  Normocephalic and atraumatic.     Nose: Nose normal.     Mouth/Throat:     Mouth: Mucous membranes are moist.     Pharynx: Oropharynx is clear.  Eyes:     Extraocular Movements: Extraocular movements intact.     Conjunctiva/sclera: Conjunctivae normal.     Pupils: Pupils are equal, round, and reactive to light.  Cardiovascular:     Rate and Rhythm: Normal rate and regular rhythm.     Pulses: Normal pulses.     Heart sounds: Normal heart sounds.  Pulmonary:     Effort: Pulmonary effort is normal.     Breath sounds: Normal breath sounds.  Abdominal:     Palpations: Abdomen is soft.  Musculoskeletal:        General: Normal range of motion.     Cervical back: Normal range of motion and neck supple.  Lymphadenopathy:     Cervical: No cervical adenopathy.  Skin:    General: Skin is warm and dry.     Capillary Refill: Capillary refill takes less than 2 seconds.  Neurological:     General: No focal deficit present.     Mental Status: She is alert and oriented to person, place, and time.  Psychiatric:        Mood and Affect: Mood normal.        Behavior: Behavior normal.        Thought Content: Thought content normal.        Judgment: Judgment normal.    Assessment & Plan:  1. Encounter to establish care Appointment today to establish new primary care provider. Will get records from previous PCP and GI for review and update patient chart.   2. Primary insomnia Trial trazodone 25-50mg  at bedtime as needed for insomnia.  - traZODone (DESYREL) 50 MG tablet; Take 0.5-1 tablets (25-50 mg total) by mouth at bedtime as needed for sleep.  Dispense: 30 tablet; Refill: 3  3. Moderate major depression (HCC) Continue lexapro 20mg  daily. Does take diazepam on as needed basis. No refills were needed today.  - escitalopram (LEXAPRO) 20 MG tablet; Take 1 tablet (20 mg total) by mouth daily.  Dispense: 30 tablet; Refill: 3  4. Body mass index 27.0-27.9, adult Discussed lowering calorie intake  to 1500 calories per day and incorporating exercise into daily routine to help lose weight.   5. Other migraine without status migrainosus, not intractable Continue to see neurology for surveillance and management on migraines .    Problem List Items Addressed This Visit       Cardiovascular and Mediastinum   Migraine headache   Relevant Medications   zonisamide (ZONEGRAN) 100 MG capsule   rosuvastatin (CRESTOR) 5 MG tablet   PARoxetine (PAXIL) 10 MG tablet   tiZANidine (ZANAFLEX) 4 MG tablet   traZODone (  DESYREL) 50 MG tablet   escitalopram (LEXAPRO) 20 MG tablet     Other   Primary insomnia   Relevant Medications   traZODone (DESYREL) 50 MG tablet   Moderate major depression (HCC)   Relevant Medications   PARoxetine (PAXIL) 10 MG tablet   traZODone (DESYREL) 50 MG tablet   escitalopram (LEXAPRO) 20 MG tablet   Body mass index 27.0-27.9, adult   Other Visit Diagnoses     Encounter to establish care    -  Primary       Outpatient Encounter Medications as of 06/19/2021  Medication Sig   b complex vitamins tablet Take 1 tablet by mouth daily.   baclofen (LIORESAL) 10 MG tablet SMARTSIG:Tablet(s) By Mouth Every 6-8 Hours PRN   CAMBIA 50 MG PACK Reported on 09/23/2015   fluticasone (FLONASE) 50 MCG/ACT nasal spray Place into both nostrils daily.   ibuprofen (ADVIL,MOTRIN) 200 MG tablet Take 200 mg by mouth every 8 (eight) hours as needed. Reported on 09/23/2015   ketorolac (ACULAR) 0.4 % SOLN Place into the right eye 2 (two) times daily.   magnesium gluconate (MAGONATE) 500 MG tablet Take by mouth.   METRONIDAZOLE, TOPICAL, 0.75 % LOTN Apply a thin coat to the entire face QHS   montelukast (SINGULAIR) 10 MG tablet montelukast 10 mg tablet  TAKE 1 TABLET (10 MG) BY MOUTH DAILY.   Multiple Vitamins-Minerals (PRESERVISION AREDS 2+MULTI VIT PO) PreserVision AREDS   Multiple Vitamins-Minerals (PRESERVISION AREDS PO) Take by mouth.   ondansetron (ZOFRAN-ODT) 8 MG disintegrating  tablet Take by mouth.   PARoxetine (PAXIL) 10 MG tablet Take by mouth.   psyllium (METAMUCIL) 58.6 % powder Take 1 packet by mouth daily.   rosuvastatin (CRESTOR) 5 MG tablet Take 5 mg by mouth at bedtime.   SUMAtriptan 6 MG/0.5ML SOAJ INJECT AT ONSET OF HEADACHE, MAY REPEAT IN 2 HOURS IF NEEDED BUT NO MORE THAN 2 IN 24 HOURS   tiZANidine (ZANAFLEX) 4 MG tablet Take 4 mg by mouth at bedtime.   traZODone (DESYREL) 50 MG tablet Take 0.5-1 tablets (25-50 mg total) by mouth at bedtime as needed for sleep.   zonisamide (ZONEGRAN) 100 MG capsule Take 1 pill at night for 2 weeks then increase to 2 pills nightly   escitalopram (LEXAPRO) 20 MG tablet Take 1 tablet (20 mg total) by mouth daily.   [DISCONTINUED] COMBIPATCH 0.05-0.14 MG/DAY Place 1 patch onto the skin 2 (two) times a week.   [DISCONTINUED] diazepam (VALIUM) 5 MG tablet Take 5 mg by mouth every 6 (six) hours as needed for anxiety.   [DISCONTINUED] escitalopram (LEXAPRO) 5 MG tablet Take 20 mg by mouth daily.   [DISCONTINUED] fexofenadine (ALLEGRA) 180 MG tablet Take by mouth.   [DISCONTINUED] ketorolac (ACULAR) 0.5 % ophthalmic solution INSTILL 1 DROP INTO THE RIGHT EYE 4 TIMES A DAY AS DIRECTED   [DISCONTINUED] Rimegepant Sulfate (NURTEC) 75 MG TBDP TAKE 1 TABLET AT ONSET OF HEADACHE. MAY REPEAT IN 24 HOURS IF NEEDED   [DISCONTINUED] UNABLE TO FIND Marylou Flesher - OBC   No facility-administered encounter medications on file as of 06/19/2021.    Follow-up: Return for health maintenance exam - need records from Dr little and from GI,  see below.   Ronnell Freshwater, NP

## 2021-06-20 ENCOUNTER — Encounter: Payer: Self-pay | Admitting: Physical Therapy

## 2021-06-20 ENCOUNTER — Ambulatory Visit: Payer: Federal, State, Local not specified - PPO | Admitting: Physical Therapy

## 2021-06-20 DIAGNOSIS — M62838 Other muscle spasm: Secondary | ICD-10-CM

## 2021-06-20 DIAGNOSIS — H61303 Acquired stenosis of external ear canal, unspecified, bilateral: Secondary | ICD-10-CM | POA: Diagnosis not present

## 2021-06-20 DIAGNOSIS — R279 Unspecified lack of coordination: Secondary | ICD-10-CM

## 2021-06-20 DIAGNOSIS — N393 Stress incontinence (female) (male): Secondary | ICD-10-CM | POA: Diagnosis not present

## 2021-06-20 DIAGNOSIS — H6123 Impacted cerumen, bilateral: Secondary | ICD-10-CM | POA: Diagnosis not present

## 2021-06-20 DIAGNOSIS — H903 Sensorineural hearing loss, bilateral: Secondary | ICD-10-CM | POA: Diagnosis not present

## 2021-06-20 DIAGNOSIS — M6281 Muscle weakness (generalized): Secondary | ICD-10-CM

## 2021-06-20 DIAGNOSIS — Z87891 Personal history of nicotine dependence: Secondary | ICD-10-CM | POA: Diagnosis not present

## 2021-06-20 NOTE — Therapy (Signed)
[facility] @ [place] [place], [place], [address] Phone: [phone]   Fax:  [phone]  Physical Therapy Treatment  Patient Details  Name: Christine Bishop MRN: [id number] Date of Birth: [birth year] Referring Provider (PT): Janyth Contes, MD   Encounter Date: 06/20/2021   PT End of Session - 06/20/21 1452     Visit Number 2    Date for PT Re-Evaluation 09/07/21    Authorization Type BCBS    PT Start Time 1448    PT Stop Time 1528    PT Time Calculation (min) 40 min    Activity Tolerance Patient tolerated treatment well    Behavior During Therapy [facility] for tasks assessed/performed             Past Medical History:  Diagnosis Date   Anxiety    Dysplastic nevus 12/24/2019   R upper back paraspinal - moderate   Dysplastic nevus 12/24/2019   R to mid upper back 3.0 cm lat to spine above braline - mild   History of migraine headaches    Posterior vitreous detachment of left eye 12/14/2019    Past Surgical History:  Procedure Laterality Date   CHOLECYSTECTOMY OPEN     LASIK     LEEP      There were no vitals filed for this visit.   Subjective Assessment - 06/20/21 1451     Subjective Pt states she has been doing the stretches.  No bad leaks but overall about the same since last week.  I have had no fecal incontinence since taking metamucil and drinking a lot of water.    Pertinent History galbladder removal, 4th deg tear during vaginal delivery    Patient Stated Goals stop having leakage    Currently in Pain? No/denies                               [facility] Adult PT Treatment/Exercise - 06/20/21 0001       Neuro Re-ed    Neuro Re-ed Details  TrA and kegel activation with UE/LE movements and breathing      Exercises   Exercises Lumbar      Lumbar Exercises: Stretches   Active Hamstring Stretch Right;Left;2 reps;30 seconds      Lumbar Exercises: Standing   Other Standing  Lumbar Exercises leaning on table and breathing with kegel      Lumbar Exercises: Supine   Ab Set 10 reps    AB Set Limitations pressing on thighs    Bent Knee Raise 10 reps    Other Supine Lumbar Exercises bent knee fall out                     PT Education - 06/20/21 1650     Education Details added core and kegel - basic ex's    Person(s) Educated Patient    Methods Explanation;Demonstration;Tactile cues;Verbal cues;Handout    Comprehension Verbalized understanding;Returned demonstration              PT Short Term Goals - 06/15/21 0852       PT SHORT TERM GOAL #1   Title Pt will be ind with diaphragmatic breathing    Time 4    Period Weeks    Status New    Target Date 07/13/21      PT SHORT TERM GOAL #2   Title Pt will be ind with initial  HEP    Time 4    Period Weeks    Status New    Target Date 07/13/21      PT SHORT TERM GOAL #3   Title Pt will be able to exhale with exertion while coordinating the pelvic floor    Time 4    Period Weeks    Status New    Target Date 07/13/21               PT Long Term Goals - 06/15/21 0854       PT LONG TERM GOAL #1   Title Pt will report not having any leakage that she is unaware of due to improved muscle tone and sensation    Time 12    Period Weeks    Status New    Target Date 09/07/21      PT LONG TERM GOAL #2   Title Pt will be ind with advanced HEP    Time 12    Period Weeks    Status New    Target Date 09/07/21      PT LONG TERM GOAL #3   Title Pt will report no fecal incontinence    Time 12    Period Weeks    Status New    Target Date 09/07/21      PT LONG TERM GOAL #4   Title Pt will be able to cough and sneeze without leakage    Time 12    Period Weeks    Status New    Target Date 09/07/21      PT LONG TERM GOAL #5   Title Pt will have at least 75% less bladder leakage    Time 12    Period Weeks    Status New    Target Date 09/07/21                   Plan  - 06/20/21 1534     Clinical Impression Statement No goals met due to initial f/u today.  Pt has not had fecal leakage due to improved BMs.  Pt still having bladder leakage but not too bad lately.  Pt rounding back when attempting to hip hinge.  Pt was educated on feeling for flat back using TC and VC.  Pt was educated on transversus abdominus activation.  Pt will benefit from skilled PT to continue working on improved postural strength and reduces muscle activity througout the posterior kinetic chain.    PT Treatment/Interventions ADLs/Self Care Home Management;Biofeedback;Cryotherapy;Moist Heat;Electrical Stimulation;Therapeutic activities;Therapeutic exercise;Neuromuscular re-education;Patient/family education;Manual techniques;Passive range of motion;Dry needling;Taping    PT Next Visit Plan f/u on kegel in leaning, abdominal strength and anterior pelvic floor with posterior pelvic floor relaxing; maybe try abdominal fascial release and lumbar/thoracic STM/MFR    PT Home Exercise Plan Access Code: [email or web address]    Consulted and Agree with Plan of Care Patient             Patient will benefit from skilled therapeutic intervention in order to improve the following deficits and impairments:  Pain, Impaired flexibility, Decreased strength, Decreased coordination, Decreased endurance, Increased muscle spasms, Decreased range of motion  Visit Diagnosis: Unspecified lack of coordination  Other muscle spasm  Muscle weakness (generalized)     Problem List Patient Active Problem List   Diagnosis Date Noted   Primary insomnia 06/19/2021   Moderate major depression ([place]) 06/19/2021   Body mass index 27.0-27.9, adult 06/19/2021   Ocular migraine 09/05/2020   Postoperative  follow-up 04/28/2020   Vitreous membranes and strands, left 04/20/2020   Macular pucker, right eye 12/14/2019   Cystoid macular edema of right eye 12/14/2019   Nonexudative age-related macular degeneration, bilateral, early  dry stage 12/14/2019   Macular puckering, left eye 12/14/2019   Migraine headache 08/06/2010    Jule Ser, PT 06/20/2021, 4:54 PM  [facility] @ [address] [place], [place], [address] Phone: [phone]   Fax:  [phone]  Name: Christine Bishop MRN: [id number] Date of Birth: 13-Jun-1965

## 2021-06-20 NOTE — Patient Instructions (Signed)
Access Code: [email or web address] URL: [email or web address] Date: 06/20/2021 Prepared by: Jari Favre  Exercises Seated Hamstring Stretch - 1 x daily - 7 x weekly - 1 sets - 3 reps - 30 sec hold Seated Piriformis Stretch with Trunk Bend - 1 x daily - 7 x weekly - 1 sets - 3 reps - 30 sec hold Seated Piriformis Stretch - 1 x daily - 7 x weekly - 1 sets - 3 reps - 30 hold Supine Pelvic Floor Stretch - Hands on Knees - 1 x daily - 7 x weekly - 1 sets - 3 reps - 30 hold Standing Low Back Flexion at Table - 3 x daily - 7 x weekly - 1 sets - 8 reps Supine Transversus Abdominis Bracing with Double Leg Fallout - 1 x daily - 7 x weekly - 2 sets - 10 reps

## 2021-06-22 ENCOUNTER — Other Ambulatory Visit: Payer: Federal, State, Local not specified - PPO

## 2021-06-22 ENCOUNTER — Other Ambulatory Visit: Payer: Self-pay

## 2021-06-22 DIAGNOSIS — E569 Vitamin deficiency, unspecified: Secondary | ICD-10-CM

## 2021-06-22 DIAGNOSIS — Z0000 Encounter for general adult medical examination without abnormal findings: Secondary | ICD-10-CM | POA: Diagnosis not present

## 2021-06-22 DIAGNOSIS — E039 Hypothyroidism, unspecified: Secondary | ICD-10-CM | POA: Diagnosis not present

## 2021-06-23 LAB — CBC
Hematocrit: 42.1 % (ref 34.0–46.6)
Hemoglobin: 13.9 g/dL (ref 11.1–15.9)
MCH: 30.1 pg (ref 26.6–33.0)
MCHC: 33 g/dL (ref 31.5–35.7)
MCV: 91 fL (ref 79–97)
Platelets: 178 10*3/uL (ref 150–450)
RBC: 4.62 x10E6/uL (ref 3.77–5.28)
RDW: 13 % (ref 11.7–15.4)
WBC: 4.3 10*3/uL (ref 3.4–10.8)

## 2021-06-23 LAB — COMPREHENSIVE METABOLIC PANEL
ALT: 16 IU/L (ref 0–32)
AST: 22 IU/L (ref 0–40)
Albumin/Globulin Ratio: 1.6 (ref 1.2–2.2)
Albumin: 4 g/dL (ref 3.8–4.9)
Alkaline Phosphatase: 93 IU/L (ref 44–121)
BUN/Creatinine Ratio: 18 (ref 9–23)
BUN: 13 mg/dL (ref 6–24)
Bilirubin Total: 0.4 mg/dL (ref 0.0–1.2)
CO2: 25 mmol/L (ref 20–29)
Calcium: 8.8 mg/dL (ref 8.7–10.2)
Chloride: 107 mmol/L — ABNORMAL HIGH (ref 96–106)
Creatinine, Ser: 0.74 mg/dL (ref 0.57–1.00)
Globulin, Total: 2.5 g/dL (ref 1.5–4.5)
Glucose: 89 mg/dL (ref 70–99)
Potassium: 4.3 mmol/L (ref 3.5–5.2)
Sodium: 143 mmol/L (ref 134–144)
Total Protein: 6.5 g/dL (ref 6.0–8.5)
eGFR: 95 mL/min/{1.73_m2} (ref >59)

## 2021-06-23 LAB — HEMOGLOBIN A1C
Est. average glucose Bld gHb Est-mCnc: 108 mg/dL
Hgb A1c MFr Bld: 5.4 % (ref 4.8–5.6)

## 2021-06-23 LAB — LIPID PANEL
Chol/HDL Ratio: 3.7 ratio (ref 0.0–4.4)
Cholesterol, Total: 170 mg/dL (ref 100–199)
HDL: 46 mg/dL (ref >39)
LDL Chol Calc (NIH): 102 mg/dL — ABNORMAL HIGH (ref 0–99)
Triglycerides: 123 mg/dL (ref 0–149)
VLDL Cholesterol Cal: 22 mg/dL (ref 5–40)

## 2021-06-23 LAB — T4, FREE: Free T4: 1.06 ng/dL (ref 0.82–1.77)

## 2021-06-23 LAB — TSH: TSH: 2.47 u[IU]/mL (ref 0.450–4.500)

## 2021-06-23 LAB — VITAMIN D 25 HYDROXY (VIT D DEFICIENCY, FRACTURES): Vit D, 25-Hydroxy: 39 ng/mL (ref 30.0–100.0)

## 2021-06-23 NOTE — Progress Notes (Signed)
Waiting for vitamin d results. Other labs look good. Mild elevation of LDL

## 2021-06-25 ENCOUNTER — Other Ambulatory Visit (INDEPENDENT_AMBULATORY_CARE_PROVIDER_SITE_OTHER): Payer: Self-pay | Admitting: Ophthalmology

## 2021-06-26 NOTE — Progress Notes (Signed)
Very mild elevation of LDL cholesterol. Other labs looked good. Discuss with patient at visit 07/13/2021

## 2021-07-11 ENCOUNTER — Other Ambulatory Visit: Payer: Self-pay | Admitting: Nurse Practitioner

## 2021-07-11 DIAGNOSIS — F5101 Primary insomnia: Secondary | ICD-10-CM

## 2021-07-12 ENCOUNTER — Other Ambulatory Visit: Payer: Self-pay

## 2021-07-12 ENCOUNTER — Encounter: Payer: Self-pay | Admitting: Physical Therapy

## 2021-07-12 ENCOUNTER — Ambulatory Visit: Payer: Federal, State, Local not specified - PPO | Attending: Obstetrics and Gynecology | Admitting: Physical Therapy

## 2021-07-12 DIAGNOSIS — M62838 Other muscle spasm: Secondary | ICD-10-CM | POA: Diagnosis not present

## 2021-07-12 DIAGNOSIS — M6281 Muscle weakness (generalized): Secondary | ICD-10-CM | POA: Insufficient documentation

## 2021-07-12 DIAGNOSIS — R279 Unspecified lack of coordination: Secondary | ICD-10-CM | POA: Diagnosis not present

## 2021-07-12 NOTE — Therapy (Signed)
[facility] @ [place], [place], [address] Phone: [phone]   Fax:  [phone]  Physical Therapy Treatment  Patient Details  Name: Christine Bishop MRN: [id number] Date of Birth: June 03, 1965 Referring Provider (PT): Janyth Contes, MD   Encounter Date: 07/12/2021   PT End of Session - 07/12/21 0929     Visit Number 3    Date for PT Re-Evaluation 09/07/21    Authorization Type BCBS    PT Start Time 0932    PT Stop Time 1012    PT Time Calculation (min) 40 min    Activity Tolerance Patient tolerated treatment well    Behavior During Therapy [facility] for tasks assessed/performed             Past Medical History:  Diagnosis Date   Anxiety    Dysplastic nevus 12/24/2019   R upper back paraspinal - moderate   Dysplastic nevus 12/24/2019   R to mid upper back 3.0 cm lat to spine above braline - mild   History of migraine headaches    Posterior vitreous detachment of left eye 12/14/2019    Past Surgical History:  Procedure Laterality Date   CHOLECYSTECTOMY OPEN     LASIK     LEEP      There were no vitals filed for this visit.   Subjective Assessment - 07/12/21 0930     Subjective I am having issues.  I am still leaking when coughing but overall it is better.  There is no fecal leakage still since taking metamucil and drinking a lot of water which is helping.    Pertinent History galbladder removal, 4th deg tear during vaginal delivery    Patient Stated Goals stop having leakage    Currently in Pain? No/denies                               [facility] Adult PT Treatment/Exercise - 07/12/21 0001       Neuro Re-ed    Neuro Re-ed Details  Kegel with exhale throughout exericses      Lumbar Exercises: Stretches   Active Hamstring Stretch Right;Left;2 reps;30 seconds    Single Knee to Chest Stretch Right;Left;2 reps;20 seconds      Lumbar Exercises: Aerobic   Nustep L3 x 5 min  with core engaged and working on endurance      Lumbar Exercises: Standing   Other Standing Lumbar Exercises lifting; ext green band feet neutral and staggered stance; on foam mat and on flat surface      Lumbar Exercises: Supine   Other Supine Lumbar Exercises kegel with hip IR and ER using yoga block -20 reps each                     PT Education - 07/12/21 1011     Education Details Access Code: [employer]) Educated Patient    Methods Explanation;Demonstration;Tactile cues;Verbal cues;Handout    Comprehension Verbalized understanding;Returned demonstration              PT Short Term Goals - 06/15/21 0852       PT SHORT TERM GOAL #1   Title Pt will be ind with diaphragmatic breathing    Time 4    Period Weeks    Status New    Target Date 07/13/21      PT SHORT TERM GOAL #2  Title Pt will be ind with initial HEP    Time 4    Period Weeks    Status New    Target Date 07/13/21      PT SHORT TERM GOAL #3   Title Pt will be able to exhale with exertion while coordinating the pelvic floor    Time 4    Period Weeks    Status New    Target Date 07/13/21               PT Long Term Goals - 06/15/21 0854       PT LONG TERM GOAL #1   Title Pt will report not having any leakage that she is unaware of due to improved muscle tone and sensation    Time 12    Period Weeks    Status New    Target Date 09/07/21      PT LONG TERM GOAL #2   Title Pt will be ind with advanced HEP    Time 12    Period Weeks    Status New    Target Date 09/07/21      PT LONG TERM GOAL #3   Title Pt will report no fecal incontinence    Time 12    Period Weeks    Status New    Target Date 09/07/21      PT LONG TERM GOAL #4   Title Pt will be able to cough and sneeze without leakage    Time 12    Period Weeks    Status New    Target Date 09/07/21      PT LONG TERM GOAL #5   Title Pt will have at least 75% less bladder leakage    Time 12    Period  Weeks    Status New    Target Date 09/07/21                   Plan - 07/12/21 1151     Clinical Impression Statement Pt is doing better overall.  Pt was educated on kegels in supine with block for variation on how to isolate the pelvic floor muscles.  Pt did well with exercises today.  She was given updates to HEP.  Continue per POC is recommended.    PT Treatment/Interventions ADLs/Self Care Home Management;Biofeedback;Cryotherapy;Moist Heat;Electrical Stimulation;Therapeutic activities;Therapeutic exercise;Neuromuscular re-education;Patient/family education;Manual techniques;Passive range of motion;Dry needling;Taping    PT Next Visit Plan f/u with kegel with yoga block;    PT Home Exercise Plan Access Code: [email or web address]    Consulted and Agree with Plan of Care Patient             Patient will benefit from skilled therapeutic intervention in order to improve the following deficits and impairments:  Pain, Impaired flexibility, Decreased strength, Decreased coordination, Decreased endurance, Increased muscle spasms, Decreased range of motion  Visit Diagnosis: Unspecified lack of coordination  Other muscle spasm  Muscle weakness (generalized)     Problem List Patient Active Problem List   Diagnosis Date Noted   Primary insomnia 06/19/2021   Moderate major depression ([place]) 06/19/2021   Body mass index 27.0-27.9, adult 06/19/2021   Ocular migraine 09/05/2020   Postoperative follow-up 04/28/2020   Vitreous membranes and strands, left 04/20/2020   Macular pucker, right eye 12/14/2019   Cystoid macular edema of right eye 12/14/2019   Nonexudative age-related macular degeneration, bilateral, early dry stage 12/14/2019   Macular puckering, left eye 12/14/2019   Migraine headache 08/06/2010  Jule Ser, PT 07/12/2021, 12:31 PM  [facility] @ [place], [place], [address] Phone: [phone]    Fax:  [phone]  Name: Christine Bishop MRN: [id number] Date of Birth: 02/13/1966

## 2021-07-12 NOTE — Patient Instructions (Signed)
Access Code: [email or web address] URL: [email or web address] Date: 07/12/2021 Prepared by: Jari Favre  Exercises Seated Hamstring Stretch - 1 x daily - 7 x weekly - 1 sets - 3 reps - 30 sec hold Seated Piriformis Stretch with Trunk Bend - 1 x daily - 7 x weekly - 1 sets - 3 reps - 30 sec hold Seated Piriformis Stretch - 1 x daily - 7 x weekly - 1 sets - 3 reps - 30 hold Supine Pelvic Floor Stretch - Hands on Knees - 1 x daily - 7 x weekly - 1 sets - 3 reps - 30 hold Standing Low Back Flexion at Table - 3 x daily - 7 x weekly - 1 sets - 8 reps Supine Transversus Abdominis Bracing with Double Leg Fallout - 1 x daily - 7 x weekly - 2 sets - 10 reps Ball squeeze with Kegel - 3 x daily - 7 x weekly - 1 sets - 10 reps - 3 sec hold Standing Shoulder Flexion to 90 Degrees with Dumbbells - 1 x daily - 7 x weekly - 3 sets - 10 reps Shoulder extension with resistance - Neutral - 1 x daily - 7 x weekly - 3 sets - 10 reps

## 2021-07-13 ENCOUNTER — Ambulatory Visit (INDEPENDENT_AMBULATORY_CARE_PROVIDER_SITE_OTHER): Payer: Federal, State, Local not specified - PPO | Admitting: Nurse Practitioner

## 2021-07-13 ENCOUNTER — Encounter: Payer: Self-pay | Admitting: Nurse Practitioner

## 2021-07-13 VITALS — BP 115/70 | HR 85 | Temp 98.4°F | Ht 66.0 in | Wt 173.4 lb

## 2021-07-13 DIAGNOSIS — Z6827 Body mass index (BMI) 27.0-27.9, adult: Secondary | ICD-10-CM

## 2021-07-13 DIAGNOSIS — F5101 Primary insomnia: Secondary | ICD-10-CM

## 2021-07-13 DIAGNOSIS — E782 Mixed hyperlipidemia: Secondary | ICD-10-CM | POA: Insufficient documentation

## 2021-07-13 DIAGNOSIS — F321 Major depressive disorder, single episode, moderate: Secondary | ICD-10-CM

## 2021-07-13 DIAGNOSIS — Z0001 Encounter for general adult medical examination with abnormal findings: Secondary | ICD-10-CM

## 2021-07-13 NOTE — Progress Notes (Signed)
Established patient visit   Patient: Christine Bishop   DOB: 04/23/1966   56 y.o. Female  MRN: [id number] Visit Date: 07/13/2021   Chief Complaint  Patient presents with   Annual Exam   Subjective    HPI  The patient is here for annual wellness visit.  -added trazodone $RemoveBeforeDE'50mg'TxgXsBMCsoFHqFR$  at bedtime to help with sleep. She states that this is working well when needed. Does need to take the entire tablet.  -routine, fasting labs done and reviewed today  -mild elevation of LDL at 102. Takes crestor 3 days per week.   -other labs essentially normal.  -concern for weight gain of 3 pounds since she was last seen. Has not been exercising like she should. Eating healthy.  -no new concerns or complaints.   Medications: Outpatient Medications Prior to Visit  Medication Sig   b complex vitamins tablet Take 1 tablet by mouth daily.   baclofen (LIORESAL) 10 MG tablet SMARTSIG:Tablet(s) By Mouth Every 6-8 Hours PRN   CAMBIA 50 MG PACK Reported on 09/23/2015   escitalopram (LEXAPRO) 20 MG tablet Take 1 tablet (20 mg total) by mouth daily.   fluticasone (FLONASE) 50 MCG/ACT nasal spray Place into both nostrils daily.   ibuprofen (ADVIL,MOTRIN) 200 MG tablet Take 200 mg by mouth every 8 (eight) hours as needed. Reported on 09/23/2015   ketorolac (ACULAR) 0.5 % ophthalmic solution INSTILL 1 DROP INTO THE RIGHT EYE 4 TIMES A DAY AS DIRECTED   magnesium gluconate (MAGONATE) 500 MG tablet Take by mouth.   METRONIDAZOLE, TOPICAL, 0.75 % LOTN Apply a thin coat to the entire face QHS   montelukast (SINGULAIR) 10 MG tablet montelukast 10 mg tablet  TAKE 1 TABLET (10 MG) BY MOUTH DAILY.   Multiple Vitamins-Minerals (PRESERVISION AREDS 2+MULTI VIT PO) PreserVision AREDS   ondansetron (ZOFRAN-ODT) 8 MG disintegrating tablet Take by mouth.   psyllium (METAMUCIL) 58.6 % powder Take 1 packet by mouth daily.   rosuvastatin (CRESTOR) 5 MG tablet Take 5 mg by mouth at bedtime.   SUMAtriptan 6 MG/0.5ML SOAJ INJECT AT ONSET OF  HEADACHE, MAY REPEAT IN 2 HOURS IF NEEDED BUT NO MORE THAN 2 IN 24 HOURS   tiZANidine (ZANAFLEX) 4 MG tablet Take 4 mg by mouth at bedtime.   traZODone (DESYREL) 50 MG tablet Take 0.5-1 tablets (25-50 mg total) by mouth at bedtime as needed for sleep.   zonisamide (ZONEGRAN) 100 MG capsule Take 1 pill at night for 2 weeks then increase to 2 pills nightly   ketorolac (ACULAR) 0.4 % SOLN Place into the right eye 2 (two) times daily. (Patient not taking: Reported on 07/13/2021)   Multiple Vitamins-Minerals (PRESERVISION AREDS PO) Take by mouth. (Patient not taking: Reported on 07/13/2021)   [DISCONTINUED] PARoxetine (PAXIL) 10 MG tablet Take by mouth. (Patient not taking: Reported on 07/13/2021)   No facility-administered medications prior to visit.    Review of Systems  Constitutional:  Negative for activity change, appetite change, chills, fatigue and fever.  HENT:  Negative for congestion, postnasal drip, rhinorrhea, sinus pressure, sinus pain, sneezing and sore throat.   Eyes: Negative.   Respiratory:  Negative for cough, chest tightness, shortness of breath and wheezing.   Cardiovascular:  Negative for chest pain and palpitations.  Gastrointestinal:  Negative for abdominal pain, constipation, diarrhea, nausea and vomiting.  Endocrine: Negative for cold intolerance, heat intolerance, polydipsia and polyuria.  Genitourinary:  Negative for dyspareunia, dysuria, flank pain, frequency and urgency.  Musculoskeletal:  Negative for arthralgias, back pain and  myalgias.  Skin:  Negative for rash.  Allergic/Immunologic: Negative for environmental allergies.  Neurological:  Negative for dizziness, weakness and headaches.  Hematological:  Negative for adenopathy.  Psychiatric/Behavioral:  Positive for dysphoric mood and sleep disturbance. The patient is nervous/anxious.    Last CBC Lab Results  Component Value Date   WBC 4.3 06/22/2021   HGB 13.9 06/22/2021   HCT 42.1 06/22/2021   MCV 91  06/22/2021   MCH 30.1 06/22/2021   RDW 13.0 06/22/2021   PLT 178 25/42/7062   Last metabolic panel Lab Results  Component Value Date   GLUCOSE 89 06/22/2021   NA 143 06/22/2021   K 4.3 06/22/2021   CL 107 (H) 06/22/2021   CO2 25 06/22/2021   BUN 13 06/22/2021   CREATININE 0.74 06/22/2021   EGFR 95 06/22/2021   CALCIUM 8.8 06/22/2021   PROT 6.5 06/22/2021   ALBUMIN 4.0 06/22/2021   LABGLOB 2.5 06/22/2021   AGRATIO 1.6 06/22/2021   BILITOT 0.4 06/22/2021   ALKPHOS 93 06/22/2021   AST 22 06/22/2021   ALT 16 06/22/2021   Last lipids Lab Results  Component Value Date   CHOL 170 06/22/2021   HDL 46 06/22/2021   LDLCALC 102 (H) 06/22/2021   TRIG 123 06/22/2021   CHOLHDL 3.7 06/22/2021   Last hemoglobin A1c Lab Results  Component Value Date   HGBA1C 5.4 06/22/2021   Last thyroid functions Lab Results  Component Value Date   TSH 2.470 06/22/2021   Last vitamin D Lab Results  Component Value Date   VD25OH 39.0 06/22/2021       Objective     Today's Vitals   07/13/21 0838  BP: 115/70  Pulse: 85  Temp: 98.4 F (36.9 C)  SpO2: 96%  Weight: 173 lb 6.4 oz (78.7 kg)  Height: $Remove'5\' 6"'ELvHfcy$  (1.676 m)   Body mass index is 27.99 kg/m.   BP Readings from Last 3 Encounters:  07/13/21 115/70  06/19/21 109/72  12/13/18 137/89    Wt Readings from Last 3 Encounters:  07/13/21 173 lb 6.4 oz (78.7 kg)  06/19/21 170 lb 12.8 oz (77.5 kg)  12/13/18 159 lb (72.1 kg)    Physical Exam Vitals and nursing note reviewed.  Constitutional:      Appearance: Normal appearance. She is well-developed.  HENT:     Head: Normocephalic and atraumatic.     Right Ear: Tympanic membrane, ear canal and external ear normal.     Left Ear: Tympanic membrane, ear canal and external ear normal.     Nose: Nose normal.     Mouth/Throat:     Mouth: Mucous membranes are moist.     Pharynx: Oropharynx is clear.  Eyes:     Extraocular Movements: Extraocular movements intact.      Conjunctiva/sclera: Conjunctivae normal.     Pupils: Pupils are equal, round, and reactive to light.  Neck:     Vascular: No carotid bruit.  Cardiovascular:     Rate and Rhythm: Normal rate and regular rhythm.     Pulses: Normal pulses.     Heart sounds: Normal heart sounds.  Pulmonary:     Effort: Pulmonary effort is normal.     Breath sounds: Normal breath sounds.  Abdominal:     General: Bowel sounds are normal. There is no distension.     Palpations: Abdomen is soft. There is no mass.     Tenderness: There is no abdominal tenderness. There is no guarding or rebound.  Hernia: No hernia is present.  Musculoskeletal:        General: Normal range of motion.     Cervical back: Normal range of motion and neck supple.  Lymphadenopathy:     Cervical: No cervical adenopathy.  Skin:    General: Skin is warm and dry.     Capillary Refill: Capillary refill takes less than 2 seconds.  Neurological:     General: No focal deficit present.     Mental Status: She is alert and oriented to person, place, and time.  Psychiatric:        Mood and Affect: Mood normal.        Behavior: Behavior normal.        Thought Content: Thought content normal.        Judgment: Judgment normal.      Assessment & Plan    1. Encounter for general adult medical examination with abnormal findings Annual wellness visit today  2. Body mass index 27.0-27.9, adult Discussed lowering calorie intake to 1500 calories per day and incorporating exercise into daily routine to help lose weight.   3. Primary insomnia Improved on trazodone. Continue to use as needed and as prescribed   4. 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 Continue on lexapro as prescribed   5. Mixed hyperlipidemia Overall, well managed. No changes to crestor dosing.    Problem List Items Addressed This Visit       Other   Primary insomnia   Moderate major depression ([place])   Body mass index 27.0-27.9, adult   Mixed  hyperlipidemia   Other Visit Diagnoses     Encounter for general adult medical examination with abnormal findings    -  Primary        Return in about 3 months (around 10/10/2021) for mood - please get records from [facility] and Dr. Collene Mares, GI. thanks .         Ronnell Freshwater, NP  [facility] at [facility] [phone] (phone) [phone] (fax)  [place]

## 2021-07-13 NOTE — Patient Instructions (Signed)
Fat and Cholesterol Restricted Eating Plan Getting too much fat and cholesterol in your diet may cause health problems. Choosing the right foods helps keep your fat and cholesterol at normal levels. This can keep you from getting certain diseases. Your doctor may recommend an eating plan that includes: Total fat: ______% or less of total calories a day. This is ______g of fat a day. Saturated fat: ______% or less of total calories a day. This is ______g of saturated fat a day. Cholesterol: less than _________mg a day. Fiber: ______g a day. What are tips for following this plan? General tips Work with your doctor to lose weight if you need to. Avoid: Foods with added sugar. Fried foods. Foods with trans fat or partially hydrogenated oils. This includes some margarines and baked goods. If you drink alcohol: Limit how much you have to: 0-1 drink a day for women who are not pregnant. 0-2 drinks a day for men. Know how much alcohol is in a drink. In the [place] one drink equals one 12 oz bottle of beer (355 mL), one 5 oz glass of wine (148 mL), or one 1 oz glass of hard liquor (44 mL). Reading food labels Check food labels for: Trans fats. Partially hydrogenated oils. Saturated fat (g) in each serving. Cholesterol (mg) in each serving. Fiber (g) in each serving. Choose foods with healthy fats, such as: Monounsaturated fats and polyunsaturated fats. These include olive and canola oil, flaxseeds, walnuts, almonds, and seeds. Omega-3 fats. These are found in certain fish, flaxseed oil, and ground flaxseeds. Choose grain products that have whole grains. Look for the word "whole" as the first word in the ingredient list. Cooking Cook foods using low-fat methods. These include baking, boiling, grilling, and broiling. Eat more home-cooked foods. Eat at restaurants and buffets less often. Eat less fast food. Avoid cooking using saturated fats, such as butter, cream, palm oil, palm kernel oil, and  coconut oil. Meal planning  At meals, divide your plate into four equal parts: Fill one-half of your plate with vegetables, green salads, and fruit. Fill one-fourth of your plate with whole grains. Fill one-fourth of your plate with low-fat (lean) protein foods. Eat fish that is high in omega-3 fats at least two times a week. This includes mackerel, tuna, sardines, and salmon. Eat foods that are high in fiber, such as whole grains, beans, apples, pears, berries, broccoli, carrots, peas, and barley. What foods should I eat? Fruits All fresh, canned (in natural juice), or frozen fruits. Vegetables Fresh or frozen vegetables (raw, steamed, roasted, or grilled). Green salads. Grains Whole grains, such as whole wheat or whole grain breads, crackers, cereals, and pasta. Unsweetened oatmeal, bulgur, barley, quinoa, or brown rice. Corn or whole wheat flour tortillas. Meats and other protein foods Ground beef (85% or leaner), grass-fed beef, or beef trimmed of fat. Skinless chicken or turkey. Ground chicken or turkey. Pork trimmed of fat. All fish and seafood. Egg whites. Dried beans, peas, or lentils. Unsalted nuts or seeds. Unsalted canned beans. Nut butters without added sugar or oil. Dairy Low-fat or nonfat dairy products, such as skim or 1% milk, 2% or reduced-fat cheeses, low-fat and fat-free ricotta or cottage cheese, or plain low-fat and nonfat yogurt. Fats and oils Tub margarine without trans fats. Light or reduced-fat mayonnaise and salad dressings. Avocado. Olive, canola, sesame, or safflower oils. The items listed above may not be a complete list of foods and beverages you can eat. Contact a dietitian for more information. What foods   should I avoid? Fruits Canned fruit in heavy syrup. Fruit in cream or butter sauce. Fried fruit. Vegetables Vegetables cooked in cheese, cream, or butter sauce. Fried vegetables. Grains White bread. White pasta. White rice. Cornbread. Bagels, pastries,  and croissants. Crackers and snack foods that contain trans fat and hydrogenated oils. Meats and other protein foods Fatty cuts of meat. Ribs, chicken wings, bacon, sausage, bologna, salami, chitterlings, fatback, hot dogs, bratwurst, and packaged lunch meats. Liver and organ meats. Whole eggs and egg yolks. Chicken and turkey with skin. Fried meat. Dairy Whole or 2% milk, cream, half-and-half, and cream cheese. Whole milk cheeses. Whole-fat or sweetened yogurt. Full-fat cheeses. Nondairy creamers and whipped toppings. Processed cheese, cheese spreads, and cheese curds. Fats and oils Butter, stick margarine, lard, shortening, ghee, or bacon fat. Coconut, palm kernel, and palm oils. Beverages Alcohol. Sugar-sweetened drinks such as sodas, lemonade, and fruit drinks. Sweets and desserts Corn syrup, sugars, honey, and molasses. Candy. Jam and jelly. Syrup. Sweetened cereals. Cookies, pies, cakes, donuts, muffins, and ice cream. The items listed above may not be a complete list of foods and beverages you should avoid. Contact a dietitian for more information. Summary Choosing the right foods helps keep your fat and cholesterol at normal levels. This can keep you from getting certain diseases. At meals, fill one-half of your plate with vegetables, green salads, and fruits. Eat high fiber foods, like whole grains, beans, apples, pears, berries, carrots, peas, and barley. Limit added sugar, saturated fats, alcohol, and fried foods. This information is not intended to replace advice given to you by your health care provider. Make sure you discuss any questions you have with your health care provider. Document Revised: 09/16/2020 Document Reviewed: 09/16/2020 Elsevier Patient Education  2022 [employer]vier Inc.  

## 2021-07-19 ENCOUNTER — Encounter: Payer: Federal, State, Local not specified - PPO | Admitting: Physical Therapy

## 2021-07-24 DIAGNOSIS — Z1151 Encounter for screening for human papillomavirus (HPV): Secondary | ICD-10-CM | POA: Diagnosis not present

## 2021-07-24 DIAGNOSIS — Z01419 Encounter for gynecological examination (general) (routine) without abnormal findings: Secondary | ICD-10-CM | POA: Diagnosis not present

## 2021-07-24 DIAGNOSIS — Z1231 Encounter for screening mammogram for malignant neoplasm of breast: Secondary | ICD-10-CM | POA: Diagnosis not present

## 2021-07-24 DIAGNOSIS — Z130 Encounter for screening for diseases of the blood and blood-forming organs and certain disorders involving the immune mechanism: Secondary | ICD-10-CM | POA: Diagnosis not present

## 2021-07-24 DIAGNOSIS — Z124 Encounter for screening for malignant neoplasm of cervix: Secondary | ICD-10-CM | POA: Diagnosis not present

## 2021-07-26 ENCOUNTER — Encounter: Payer: Federal, State, Local not specified - PPO | Admitting: Physical Therapy

## 2021-08-02 ENCOUNTER — Encounter: Payer: Federal, State, Local not specified - PPO | Admitting: Physical Therapy

## 2021-08-09 ENCOUNTER — Encounter: Payer: Federal, State, Local not specified - PPO | Admitting: Physical Therapy

## 2021-08-10 ENCOUNTER — Other Ambulatory Visit: Payer: Self-pay | Admitting: Nurse Practitioner

## 2021-08-10 DIAGNOSIS — F5101 Primary insomnia: Secondary | ICD-10-CM

## 2021-08-16 ENCOUNTER — Encounter: Payer: Federal, State, Local not specified - PPO | Admitting: Physical Therapy

## 2021-09-03 DIAGNOSIS — R457 State of emotional shock and stress, unspecified: Secondary | ICD-10-CM | POA: Diagnosis not present

## 2021-09-03 DIAGNOSIS — R079 Chest pain, unspecified: Secondary | ICD-10-CM | POA: Diagnosis not present

## 2021-09-03 DIAGNOSIS — R0789 Other chest pain: Secondary | ICD-10-CM | POA: Diagnosis not present

## 2021-09-05 ENCOUNTER — Ambulatory Visit (INDEPENDENT_AMBULATORY_CARE_PROVIDER_SITE_OTHER): Payer: Federal, State, Local not specified - PPO | Admitting: Ophthalmology

## 2021-09-05 ENCOUNTER — Encounter (INDEPENDENT_AMBULATORY_CARE_PROVIDER_SITE_OTHER): Payer: Self-pay | Admitting: Ophthalmology

## 2021-09-05 DIAGNOSIS — H35371 Puckering of macula, right eye: Secondary | ICD-10-CM | POA: Diagnosis not present

## 2021-09-05 DIAGNOSIS — H43312 Vitreous membranes and strands, left eye: Secondary | ICD-10-CM

## 2021-09-05 DIAGNOSIS — H35351 Cystoid macular degeneration, right eye: Secondary | ICD-10-CM

## 2021-09-05 DIAGNOSIS — H35372 Puckering of macula, left eye: Secondary | ICD-10-CM

## 2021-09-05 NOTE — Assessment & Plan Note (Signed)
OS minor temporally nondistorted ?

## 2021-09-05 NOTE — Assessment & Plan Note (Signed)
OD, minor nasal none distorting ?

## 2021-09-05 NOTE — Patient Instructions (Signed)
Patient to continue to use ketorolac 1 drop right eye twice daily ?

## 2021-09-05 NOTE — Assessment & Plan Note (Signed)
Stable no recurrence of CME on twice daily ketorolac ?

## 2021-09-05 NOTE — Progress Notes (Signed)
? ? ?09/05/2021 ? ?  ? ?CHIEF COMPLAINT ?Patient presents for  ?Chief Complaint  ?Patient presents with  ? Retina Follow Up  ? ? ? ? ?HISTORY OF PRESENT ILLNESS: ?Christine Bishop is a 56 y.o. female who presents to the clinic today for:  ? ?HPI   ? ? Retina Follow Up   ? ?      ? Diagnosis: Vitreous membranes and strands  ? Laterality: left eye  ? Onset: 1 year ago  ? ?  ?  ? ? Comments   ?1 yr fu OU oct. ?Patient states vision is stable and unchanged since last visit. Denies any new floaters or FOL. ?Patient states her floaters are stable. ?Pt uses Preservision AREDS twice a day, Ketorolac BID OD. ? ?  ?  ?Last edited by Laurin Coder on 09/05/2021  8:07 AM.  ?  ? ? ?Referring physician: ?Tamsen Roers, MD ?[place],  [place] [id number] ? ?HISTORICAL INFORMATION:  ? ?Selected notes from the [facility] ?  ? ?Lab Results  ?Component Value Date  ? HGBA1C 5.4 06/22/2021  ?  ? ?CURRENT MEDICATIONS: ?Current Outpatient Medications (Ophthalmic Drugs)  ?Medication Sig  ? ketorolac (ACULAR) 0.4 % SOLN Place into the right eye 2 (two) times daily. (Patient not taking: Reported on 07/13/2021)  ? ketorolac (ACULAR) 0.5 % ophthalmic solution INSTILL 1 DROP INTO THE RIGHT EYE 4 TIMES A DAY AS DIRECTED  ? ?No current facility-administered medications for this visit. (Ophthalmic Drugs)  ? ?Current Outpatient Medications (Other)  ?Medication Sig  ? b complex vitamins tablet Take 1 tablet by mouth daily.  ? baclofen (LIORESAL) 10 MG tablet SMARTSIG:Tablet(s) By Mouth Every 6-8 Hours PRN  ? CAMBIA 50 MG PACK Reported on 09/23/2015  ? escitalopram (LEXAPRO) 20 MG tablet Take 1 tablet (20 mg total) by mouth daily.  ? fluticasone (FLONASE) 50 MCG/ACT nasal spray Place into both nostrils daily.  ? ibuprofen (ADVIL,MOTRIN) 200 MG tablet Take 200 mg by mouth every 8 (eight) hours as needed. Reported on 09/23/2015  ? magnesium gluconate (MAGONATE) 500 MG tablet Take by mouth.  ? METRONIDAZOLE, TOPICAL, 0.75 % LOTN Apply a thin  coat to the entire face QHS  ? montelukast (SINGULAIR) 10 MG tablet montelukast 10 mg tablet ? TAKE 1 TABLET (10 MG) BY MOUTH DAILY.  ? Multiple Vitamins-Minerals (PRESERVISION AREDS 2+MULTI VIT PO) PreserVision AREDS  ? Multiple Vitamins-Minerals (PRESERVISION AREDS PO) Take by mouth. (Patient not taking: Reported on 07/13/2021)  ? ondansetron (ZOFRAN-ODT) 8 MG disintegrating tablet Take by mouth.  ? psyllium (METAMUCIL) 58.6 % powder Take 1 packet by mouth daily.  ? rosuvastatin (CRESTOR) 5 MG tablet Take 5 mg by mouth at bedtime.  ? SUMAtriptan 6 MG/0.5ML SOAJ INJECT AT ONSET OF HEADACHE, MAY REPEAT IN 2 HOURS IF NEEDED BUT NO MORE THAN 2 IN 24 HOURS  ? tiZANidine (ZANAFLEX) 4 MG tablet Take 4 mg by mouth at bedtime.  ? traZODone (DESYREL) 50 MG tablet TAKE 0.5-1 TABLETS BY MOUTH AT BEDTIME AS NEEDED FOR SLEEP.  ? zonisamide (ZONEGRAN) 100 MG capsule Take 1 pill at night for 2 weeks then increase to 2 pills nightly  ? ?No current facility-administered medications for this visit. (Other)  ? ? ? ? ?REVIEW OF SYSTEMS: ?ROS   ?Negative for: Constitutional, Gastrointestinal, Neurological, Skin, Genitourinary, Musculoskeletal, HENT, Endocrine, Cardiovascular, Eyes, Respiratory, Psychiatric, Allergic/Imm, Heme/Lymph ?Last edited by Hurman Horn, MD on 09/05/2021  8:29 AM.  ?  ? ? ? ?  ALLERGIES ?Allergies  ?Allergen Reactions  ? Sulfa Antibiotics   ? Sulfamethoxazole-Trimethoprim   ?  REACTION: nausea; vomiting  ? Zonegran [Zonisamide]   ?  Bipolar/ Mania symptoms per patient  ? Topamax [Topiramate] Anxiety  ? ? ?PAST MEDICAL HISTORY ?Past Medical History:  ?Diagnosis Date  ? Anxiety   ? Dysplastic nevus 12/24/2019  ? R upper back paraspinal - moderate  ? Dysplastic nevus 12/24/2019  ? R to mid upper back 3.0 cm lat to spine above braline - mild  ? History of migraine headaches   ? Posterior vitreous detachment of left eye 12/14/2019  ? Vitreous membranes and strands, left 04/20/2020  ? Vitrectomy left eye 04-27-20  ? ?Past  Surgical History:  ?Procedure Laterality Date  ? CHOLECYSTECTOMY OPEN    ? LASIK    ? LEEP    ? ? ?FAMILY HISTORY ?Family History  ?Problem Relation Age of Onset  ? Cancer Mother   ? Hypertension Father   ? Hyperlipidemia Father   ? Cancer Maternal Grandmother   ? ? ?SOCIAL HISTORY ?Social History  ? ?Tobacco Use  ? Smoking status: Former  ?  Types: Cigarettes  ?  Quit date: 06/07/2011  ?  Years since quitting: 10.2  ? Smokeless tobacco: Never  ?Substance Use Topics  ? Alcohol use: No  ? Drug use: No  ? ?  ? ?  ? ?OPHTHALMIC EXAM: ? ?Base Eye Exam   ? ? Visual Acuity (ETDRS)   ? ?   Right Left  ? Dist cc 20/20 -2 20/25  ? ? Correction: Glasses  ? ?  ?  ? ? Tonometry (Tonopen, 8:09 AM)   ? ?   Right Left  ? Pressure 8 10  ? ?  ?  ? ? Pupils   ? ?   Pupils Dark Light APD  ? Right PERRL 7 6 None  ? Left PERRL 7 6 None  ? ?  ?  ? ? Visual Fields (Counting fingers)   ? ?   Left Right  ?  Full Full  ? ?  ?  ? ? Extraocular Movement   ? ?   Right Left  ?  Full Full  ? ?  ?  ? ? Neuro/Psych   ? ? Oriented x3: Yes  ? Mood/Affect: Normal  ? ?  ?  ? ? Dilation   ? ? Both eyes: 1.0% Mydriacyl, 2.5% Phenylephrine @ 8:09 AM  ? ?  ?  ? ?  ? ?Slit Lamp and Fundus Exam   ? ? External Exam   ? ?   Right Left  ? External Normal Normal  ? ?  ?  ? ? Slit Lamp Exam   ? ?   Right Left  ? Lids/Lashes Normal Normal  ? Conjunctiva/Sclera White and quiet White and quiet  ? Cornea Clear Clear  ? Anterior Chamber Deep and quiet Deep and quiet  ? Iris Round and reactive Round and reactive  ? Lens Centered posterior chamber intraocular lens Centered posterior chamber intraocular lens  ? Anterior Vitreous clear, vitrectomized clear  ? ?  ?  ? ? Fundus Exam   ? ?   Right Left  ? Posterior Vitreous Vitrectomized Vitrectomized, clear  ? Disc Normal Normal  ? C/D Ratio 0.25 0.25  ? Macula Epiretinal membrane, minor, no topographic distortion nasally,  Epiretinal membrane, minor temporally  ? Vessels Normal Normal  ? Periphery Normal, no holes or tears  no holes or tears  ? ?  ?  ? ?  ? ? ?  IMAGING AND PROCEDURES  ?Imaging and Procedures for 09/05/21 ? ?OCT, Retina - OU - Both Eyes   ? ?   ?Right Eye ?Quality was good. Scan locations included subfoveal. Central Foveal Thickness: 288. Progression has been stable. Findings include abnormal foveal contour, epiretinal membrane.  ? ?Left Eye ?Quality was good. Scan locations included subfoveal. Central Foveal Thickness: 286. Progression has been stable. Findings include epiretinal membrane, abnormal foveal contour.  ? ?Notes ?Nondistorting epiretinal membrane nasal to the fovea OD ? ? ?OS, none distorting epiretinal membrane temporally ? ? ? ?  ? ? ?  ?  ? ?  ?ASSESSMENT/PLAN: ? ?Macular puckering, left eye ?OS minor temporally nondistorted ? ?Macular pucker, right eye ?OD, minor nasal none distorting ? ?Cystoid macular edema of right eye ?Stable no recurrence of CME on twice daily ketorolac ? ?Vitreous membranes and strands, left ?OS condition resolved post vitrectomy December 2021.  ? ?  ICD-10-CM   ?1. Vitreous membranes and strands, left  H43.312 OCT, Retina - OU - Both Eyes  ?  ?2. Macular pucker, right eye  H35.371 OCT, Retina - OU - Both Eyes  ?  ?3. Macular puckering, left eye  H35.372   ?  ?4. Cystoid macular edema of right eye  H35.351   ?  ? ? ?1.  OU with minor epiretinal membranes remaining.  No impact on acuity.  We will continue to monitor and observe OU. ? ?2.  OD with a history of CME now controlled on topical use of ketorolac twice daily.  Continue ketorolac twice daily OD only  ? ?3. ? ?Ophthalmic Meds Ordered this visit:  ?No orders of the defined types were placed in this encounter. ? ? ?  ? ?Return in about 1 year (around 09/06/2022) for DILATE OU, OCT, COLOR FP. ? ?Patient Instructions  ?Patient to continue to use ketorolac 1 drop right eye twice daily ? ? ?Explained the diagnoses, plan, and follow up with the patient and they expressed understanding.  Patient expressed understanding of the  importance of proper follow up care.  ? ?Clent Demark. Maizey Menendez M.D. ?Diseases & Surgery of the Retina and Vitreous ?[place] ?09/05/21 ? ? ? ? ?Abbreviations: ?M myopia (nearsighted); A astigmati

## 2021-09-05 NOTE — Assessment & Plan Note (Signed)
OS condition resolved post vitrectomy December 2021. ?

## 2021-09-07 DIAGNOSIS — F410 Panic disorder [episodic paroxysmal anxiety] without agoraphobia: Secondary | ICD-10-CM | POA: Diagnosis not present

## 2021-09-07 DIAGNOSIS — B957 Other staphylococcus as the cause of diseases classified elsewhere: Secondary | ICD-10-CM | POA: Diagnosis not present

## 2021-09-07 DIAGNOSIS — Q046 Congenital cerebral cysts: Secondary | ICD-10-CM | POA: Diagnosis not present

## 2021-09-07 DIAGNOSIS — F411 Generalized anxiety disorder: Secondary | ICD-10-CM | POA: Diagnosis not present

## 2021-09-07 DIAGNOSIS — G43909 Migraine, unspecified, not intractable, without status migrainosus: Secondary | ICD-10-CM | POA: Diagnosis not present

## 2021-09-07 DIAGNOSIS — T426X5A Adverse effect of other antiepileptic and sedative-hypnotic drugs, initial encounter: Secondary | ICD-10-CM | POA: Diagnosis not present

## 2021-09-07 DIAGNOSIS — F329 Major depressive disorder, single episode, unspecified: Secondary | ICD-10-CM | POA: Diagnosis not present

## 2021-09-07 DIAGNOSIS — E785 Hyperlipidemia, unspecified: Secondary | ICD-10-CM | POA: Diagnosis not present

## 2021-09-07 DIAGNOSIS — R4182 Altered mental status, unspecified: Secondary | ICD-10-CM | POA: Diagnosis not present

## 2021-09-07 DIAGNOSIS — N3000 Acute cystitis without hematuria: Secondary | ICD-10-CM | POA: Diagnosis not present

## 2021-09-07 DIAGNOSIS — F23 Brief psychotic disorder: Secondary | ICD-10-CM | POA: Diagnosis not present

## 2021-09-07 DIAGNOSIS — Z882 Allergy status to sulfonamides status: Secondary | ICD-10-CM | POA: Diagnosis not present

## 2021-09-07 DIAGNOSIS — F22 Delusional disorders: Secondary | ICD-10-CM | POA: Diagnosis not present

## 2021-09-07 DIAGNOSIS — R0602 Shortness of breath: Secondary | ICD-10-CM | POA: Diagnosis not present

## 2021-09-07 DIAGNOSIS — Z91128 Patient's intentional underdosing of medication regimen for other reason: Secondary | ICD-10-CM | POA: Diagnosis not present

## 2021-09-07 DIAGNOSIS — Z79899 Other long term (current) drug therapy: Secondary | ICD-10-CM | POA: Diagnosis not present

## 2021-09-07 DIAGNOSIS — R000 Tachycardia, unspecified: Secondary | ICD-10-CM | POA: Diagnosis not present

## 2021-09-07 DIAGNOSIS — Z20822 Contact with and (suspected) exposure to covid-19: Secondary | ICD-10-CM | POA: Diagnosis not present

## 2021-09-08 DIAGNOSIS — N3000 Acute cystitis without hematuria: Secondary | ICD-10-CM | POA: Diagnosis not present

## 2021-09-08 DIAGNOSIS — F23 Brief psychotic disorder: Secondary | ICD-10-CM | POA: Diagnosis not present

## 2021-09-08 DIAGNOSIS — Z20822 Contact with and (suspected) exposure to covid-19: Secondary | ICD-10-CM | POA: Diagnosis not present

## 2021-09-08 DIAGNOSIS — R4182 Altered mental status, unspecified: Secondary | ICD-10-CM | POA: Diagnosis not present

## 2021-09-08 DIAGNOSIS — F411 Generalized anxiety disorder: Secondary | ICD-10-CM | POA: Diagnosis not present

## 2021-09-08 DIAGNOSIS — F29 Unspecified psychosis not due to a substance or known physiological condition: Secondary | ICD-10-CM | POA: Diagnosis not present

## 2021-09-09 DIAGNOSIS — F23 Brief psychotic disorder: Secondary | ICD-10-CM | POA: Diagnosis not present

## 2021-09-09 DIAGNOSIS — F32A Depression, unspecified: Secondary | ICD-10-CM | POA: Diagnosis not present

## 2021-09-09 DIAGNOSIS — F411 Generalized anxiety disorder: Secondary | ICD-10-CM | POA: Diagnosis not present

## 2021-09-09 DIAGNOSIS — F29 Unspecified psychosis not due to a substance or known physiological condition: Secondary | ICD-10-CM | POA: Diagnosis not present

## 2021-09-09 DIAGNOSIS — N3000 Acute cystitis without hematuria: Secondary | ICD-10-CM | POA: Diagnosis not present

## 2021-09-09 DIAGNOSIS — G43909 Migraine, unspecified, not intractable, without status migrainosus: Secondary | ICD-10-CM | POA: Diagnosis not present

## 2021-09-09 DIAGNOSIS — E785 Hyperlipidemia, unspecified: Secondary | ICD-10-CM | POA: Diagnosis not present

## 2021-09-10 DIAGNOSIS — G43909 Migraine, unspecified, not intractable, without status migrainosus: Secondary | ICD-10-CM | POA: Diagnosis not present

## 2021-09-10 DIAGNOSIS — F23 Brief psychotic disorder: Secondary | ICD-10-CM | POA: Diagnosis not present

## 2021-09-10 DIAGNOSIS — N3000 Acute cystitis without hematuria: Secondary | ICD-10-CM | POA: Diagnosis not present

## 2021-09-11 DIAGNOSIS — M47812 Spondylosis without myelopathy or radiculopathy, cervical region: Secondary | ICD-10-CM | POA: Diagnosis not present

## 2021-09-11 DIAGNOSIS — G43001 Migraine without aura, not intractable, with status migrainosus: Secondary | ICD-10-CM | POA: Diagnosis not present

## 2021-09-16 DIAGNOSIS — F22 Delusional disorders: Secondary | ICD-10-CM | POA: Diagnosis not present

## 2021-09-16 DIAGNOSIS — R42 Dizziness and giddiness: Secondary | ICD-10-CM | POA: Diagnosis not present

## 2021-09-18 DIAGNOSIS — I959 Hypotension, unspecified: Secondary | ICD-10-CM | POA: Diagnosis not present

## 2021-09-18 DIAGNOSIS — M545 Low back pain, unspecified: Secondary | ICD-10-CM | POA: Diagnosis not present

## 2021-09-18 DIAGNOSIS — M542 Cervicalgia: Secondary | ICD-10-CM | POA: Diagnosis not present

## 2021-09-18 DIAGNOSIS — M47812 Spondylosis without myelopathy or radiculopathy, cervical region: Secondary | ICD-10-CM | POA: Diagnosis not present

## 2021-09-18 DIAGNOSIS — M549 Dorsalgia, unspecified: Secondary | ICD-10-CM | POA: Diagnosis not present

## 2021-09-18 DIAGNOSIS — R519 Headache, unspecified: Secondary | ICD-10-CM | POA: Diagnosis not present

## 2021-09-18 DIAGNOSIS — G441 Vascular headache, not elsewhere classified: Secondary | ICD-10-CM | POA: Diagnosis not present

## 2021-09-18 DIAGNOSIS — S32048A Other fracture of fourth lumbar vertebra, initial encounter for closed fracture: Secondary | ICD-10-CM | POA: Diagnosis not present

## 2021-09-18 DIAGNOSIS — M47816 Spondylosis without myelopathy or radiculopathy, lumbar region: Secondary | ICD-10-CM | POA: Diagnosis not present

## 2021-09-18 DIAGNOSIS — S161XXA Strain of muscle, fascia and tendon at neck level, initial encounter: Secondary | ICD-10-CM | POA: Diagnosis not present

## 2021-09-18 DIAGNOSIS — Z041 Encounter for examination and observation following transport accident: Secondary | ICD-10-CM | POA: Diagnosis not present

## 2021-09-19 ENCOUNTER — Ambulatory Visit (HOSPITAL_COMMUNITY)
Admission: EM | Admit: 2021-09-19 | Discharge: 2021-09-20 | Disposition: A | Discharge status: Other Acute Inpatient Hospital | Payer: Federal, State, Local not specified - PPO | Attending: Psychiatry | Admitting: Psychiatry

## 2021-09-19 DIAGNOSIS — F23 Brief psychotic disorder: Secondary | ICD-10-CM | POA: Diagnosis not present

## 2021-09-19 DIAGNOSIS — F3010 Manic episode without psychotic symptoms, unspecified: Secondary | ICD-10-CM | POA: Diagnosis not present

## 2021-09-19 DIAGNOSIS — Z20822 Contact with and (suspected) exposure to covid-19: Secondary | ICD-10-CM | POA: Insufficient documentation

## 2021-09-19 DIAGNOSIS — R4182 Altered mental status, unspecified: Secondary | ICD-10-CM | POA: Diagnosis not present

## 2021-09-19 DIAGNOSIS — F3110 Bipolar disorder, current episode manic without psychotic features, unspecified: Secondary | ICD-10-CM

## 2021-09-19 DIAGNOSIS — F29 Unspecified psychosis not due to a substance or known physiological condition: Secondary | ICD-10-CM | POA: Diagnosis not present

## 2021-09-19 DIAGNOSIS — F319 Bipolar disorder, unspecified: Secondary | ICD-10-CM | POA: Insufficient documentation

## 2021-09-19 DIAGNOSIS — Z79899 Other long term (current) drug therapy: Secondary | ICD-10-CM | POA: Insufficient documentation

## 2021-09-19 DIAGNOSIS — F419 Anxiety disorder, unspecified: Secondary | ICD-10-CM | POA: Diagnosis not present

## 2021-09-19 LAB — POCT URINE DRUG SCREEN - MANUAL ENTRY (I-SCREEN)
POC Amphetamine UR: NOT DETECTED
POC Buprenorphine (BUP): NOT DETECTED
POC Cocaine UR: NOT DETECTED
POC Marijuana UR: NOT DETECTED
POC Methadone UR: NOT DETECTED
POC Methamphetamine UR: NOT DETECTED
POC Morphine: NOT DETECTED
POC Oxazepam (BZO): POSITIVE — AB
POC Oxycodone UR: POSITIVE — AB
POC Secobarbital (BAR): NOT DETECTED

## 2021-09-19 LAB — CBC WITH DIFFERENTIAL/PLATELET
Abs Immature Granulocytes: 0.02 10*3/uL (ref 0.00–0.07)
Basophils Absolute: 0.1 10*3/uL (ref 0.0–0.1)
Basophils Relative: 1 %
Eosinophils Absolute: 0.1 10*3/uL (ref 0.0–0.5)
Eosinophils Relative: 3 %
HCT: 42.6 % (ref 36.0–46.0)
Hemoglobin: 13.6 g/dL (ref 12.0–15.0)
Immature Granulocytes: 0 %
Lymphocytes Relative: 35 %
Lymphs Abs: 1.7 10*3/uL (ref 0.7–4.0)
MCH: 31.7 pg (ref 26.0–34.0)
MCHC: 31.9 g/dL (ref 30.0–36.0)
MCV: 99.3 fL (ref 80.0–100.0)
Monocytes Absolute: 0.4 10*3/uL (ref 0.1–1.0)
Monocytes Relative: 8 %
Neutro Abs: 2.5 10*3/uL (ref 1.7–7.7)
Neutrophils Relative %: 53 %
Platelets: 181 10*3/uL (ref 150–400)
RBC: 4.29 MIL/uL (ref 3.87–5.11)
RDW: 13.2 % (ref 11.5–15.5)
WBC: 4.8 10*3/uL (ref 4.0–10.5)
nRBC: 0 % (ref 0.0–0.2)

## 2021-09-19 LAB — COMPREHENSIVE METABOLIC PANEL
ALT: 26 U/L (ref 0–44)
AST: 33 U/L (ref 15–41)
Albumin: 3.9 g/dL (ref 3.5–5.0)
Alkaline Phosphatase: 71 U/L (ref 38–126)
Anion gap: 7 (ref 5–15)
BUN: 8 mg/dL (ref 6–20)
CO2: 28 mmol/L (ref 22–32)
Calcium: 8.9 mg/dL (ref 8.9–10.3)
Chloride: 106 mmol/L (ref 98–111)
Creatinine, Ser: 0.76 mg/dL (ref 0.44–1.00)
GFR, Estimated: >60 mL/min (ref >60)
Glucose, Bld: 87 mg/dL (ref 70–99)
Potassium: 4.3 mmol/L (ref 3.5–5.1)
Sodium: 141 mmol/L (ref 135–145)
Total Bilirubin: 0.3 mg/dL (ref 0.3–1.2)
Total Protein: 6.5 g/dL (ref 6.5–8.1)

## 2021-09-19 LAB — POC SARS CORONAVIRUS 2 AG: SARSCOV2ONAVIRUS 2 AG: NEGATIVE

## 2021-09-19 LAB — ETHANOL: Alcohol, Ethyl (B): <10 mg/dL (ref <10)

## 2021-09-19 LAB — POCT PREGNANCY, URINE: Preg Test, Ur: NEGATIVE

## 2021-09-19 MED ORDER — LORAZEPAM 2 MG/ML IJ SOLN: 2.0000 mg | Freq: Once | INTRAMUSCULAR | Status: AC | PRN

## 2021-09-19 MED ORDER — ALUM & MAG HYDROXIDE-SIMETH 200-200-20 MG/5ML PO SUSP: 30.0000 mL | ORAL | Status: DC | PRN

## 2021-09-19 MED ORDER — MAGNESIUM HYDROXIDE 400 MG/5ML PO SUSP: 30.0000 mL | Freq: Every day | ORAL | Status: DC | PRN

## 2021-09-19 MED ORDER — ACETAMINOPHEN 325 MG PO TABS
650.0000 mg | ORAL_TABLET | Freq: Four times a day (QID) | ORAL | Status: DC | PRN
  Administered 2021-09-20 (×2): 650 mg via ORAL
  Filled 2021-09-19 (×2): qty 2

## 2021-09-19 MED ORDER — LORAZEPAM 1 MG PO TABS
2.0000 mg | ORAL_TABLET | Freq: Once | ORAL | Status: AC | PRN
  Administered 2021-09-19: 2 mg via ORAL
  Filled 2021-09-19: qty 2
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
## 2021-09-19 NOTE — ED Notes (Addendum)
Pt sitting on bed eating her dinner. Will continue to monitor for safety ?
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
## 2021-09-19 NOTE — ED Provider Notes (Signed)
[facility] Continuous Assessment Admission H&P ? ?Date: 09/20/21 ?Patient Name: Christine Bishop ?MRN: [id number] ?Chief Complaint:  ?Chief Complaint  ?Patient presents with  ? IVC  ?  Altered Mental Status, Anxiety  ?   ? ?Diagnoses:  ?Final diagnoses:  ?Manic behavior ([place])  ?Acute psychosis ([place])  ?Bipolar affective disorder, current episode manic, current episode severity unspecified ([place])  ? ? ?HPI: Christine Bishop 56 y.o female present to [facility] by [facility] under IVC,  Per the IVC  respondent stated the TV and FaceBook told her she was going to die,  she thinks she is in a reality show and being filmed.  She say if the camera crew does not stop filming she was going to let the dogs out on them.  She threaten to cut her husband private parts off,  she is spending money that she does not have and also giving it away,  she doesn't sleep,  sh is not able to make decision.  ? ?Collaborate: Per the patient husband who spoke with TTS,  the patient has been spending  excessive money,  they have a neighbor who is moving to [place],  the patient brought all the neighbors furniture and wanting to buy the neighbor house for $600K although the patient already has a house that is paid off. Pt has not sleep in two weeks,  excessive talking, excessive spending, he stated the patient believe she is been filmed.  The patient was taking a particular medication form her neurologist and they stop the medication a few weeks ago, that is when the behavior starting to happen.  ? ?Observation of patient,  pt is alert and orient to person, mood very disheveled, pressure speech,  word salad. Pt acting blizzard,  unable to assess pt full because she is very lucid.  Pt did denies SI,  HI, AVH and alcohol or substance abuse. Pt very teary at time,  pt incontinent while sitting in triage room.   ? ? ?Recommend inpatient observation ? ? ?PHQ 2-9:  ?Personnel officer Visit from 07/13/2021 in [place] at [facility] Visit  from 06/19/2021 in [place] [facility]  ?Thoughts that you would be better off dead, or of hurting yourself in some way Not at all Not at all  ?PHQ-9 Total Score 0 0  ? ?  ?  ?[place] [facility] from 09/19/2021 in [facility]  ?C-SSRS RISK CATEGORY No Risk  ? ?  ?  ? ?Total Time spent with patient: 20 minutes ? ?Musculoskeletal  ?Strength & Muscle Tone: within normal limits ?Gait & Station: normal ?Patient leans: N/A ? ?Psychiatric Specialty Exam  ?Presentation ?General Appearance: Bizarre ? ?Eye Contact:Minimal ? ?Speech:Garbled ? ?Speech Volume:Decreased ? ?Handedness:Ambidextrous ? ? ?Mood and Affect  ?Mood:Anxious; Labile ? ?Affect:Depressed; Restricted ? ? ?Thought Process  ?Thought Processes:Linear ? ?Descriptions of Associations:Loose ? ?Orientation:Partial ? ?Thought Content:Illogical ?   ?Hallucinations:Hallucinations: None ? ?Ideas of Reference:None ? ?Suicidal Thoughts:Suicidal Thoughts: No ? ?Homicidal Thoughts:Homicidal Thoughts: No ? ? ?Sensorium  ?Memory:Immediate Poor ? ?Judgment:Poor ? ?Insight:Poor ? ? ?Executive Functions  ?Concentration:Poor ? ?Attention Span:Poor ? ?Recall:Poor ? ?[place] ? ?Language:Fair ? ? ?Psychomotor Activity  ?Psychomotor Activity:No data recorded ? ?Assets  ?Assets:Desire for Improvement; Communication Skills ? ? ?Sleep  ?Sleep:Sleep: Fair ? ? ?Nutritional Assessment (For OBS and [facility] admissions only) ?Has the patient had a weight loss or gain of 10 pounds or more in the last  3 months?: No ?Has the patient had a decrease in food intake/or appetite?: No ?Does the patient have dental problems?: No ?Does the patient have eating habits or behaviors that may be indicators of an eating disorder including binging or inducing vomiting?: No ?Has the patient recently lost weight without trying?: 0 ?Has the patient been eating poorly because of a decreased appetite?: 0 ?Malnutrition Screening Tool Score: 0 ? ? ? ?Physical  Exam ?HENT:  ?   Head: Normocephalic.  ?   Nose: Nose normal.  ?Cardiovascular:  ?   Rate and Rhythm: Normal rate.  ?Pulmonary:  ?   Effort: Pulmonary effort is normal.  ?Musculoskeletal:     ?   General: Normal range of motion.  ?   Cervical back: Normal range of motion.  ?Skin: ?   General: Skin is warm.  ?Neurological:  ?   General: No focal deficit present.  ?   Mental Status: She is alert.  ?Psychiatric:     ?   Mood and Affect: Mood normal.     ?   Behavior: Behavior normal.     ?   Thought Content: Thought content normal.     ?   Judgment: Judgment normal.  ? ?Review of Systems  ?Constitutional: Negative.   ?HENT: Negative.    ?Eyes: Negative.   ?Respiratory: Negative.    ?Cardiovascular: Negative.   ?Gastrointestinal: Negative.   ?Genitourinary: Negative.   ?Musculoskeletal: Negative.   ?Skin: Negative.   ?Neurological: Negative.   ?Endo/Heme/Allergies: Negative.   ?Psychiatric/Behavioral:  Positive for depression and hallucinations. The patient is nervous/anxious.   ? ?Blood pressure (!) 91/58, pulse 73, temperature 98.4 ?F (36.9 ?C), temperature source Oral, resp. rate 18. There is no height or weight on file to calculate BMI. ? ?Past Psychiatric History: Anxiety   ? ?Is the patient at risk to self? No  ?Has the patient been a risk to self in the past 6 months? No .    ?Has the patient been a risk to self within the distant past? No   ?Is the patient a risk to others? No   ?Has the patient been a risk to others in the past 6 months? No   ?Has the patient been a risk to others within the distant past? No  ? ?Past Medical History:  ?Past Medical History:  ?Diagnosis Date  ? Anxiety   ? Dysplastic nevus 12/24/2019  ? R upper back paraspinal - moderate  ? Dysplastic nevus 12/24/2019  ? R to mid upper back 3.0 cm lat to spine above braline - mild  ? History of migraine headaches   ? Posterior vitreous detachment of left eye 12/14/2019  ? Vitreous membranes and strands, left 04/20/2020  ? Vitrectomy left eye  04-27-20  ?  ?Past Surgical History:  ?Procedure Laterality Date  ? CHOLECYSTECTOMY OPEN    ? LASIK    ? LEEP    ? ? ?Family History:  ?Family History  ?Problem Relation Age of Onset  ? Cancer Mother   ? Hypertension Father   ? Hyperlipidemia Father   ? Cancer Maternal Grandmother   ? ? ?Social History:  ?Social History  ? ?Socioeconomic History  ? Marital status: Married  ?  Spouse name: Not on file  ? Number of children: Not on file  ? Years of education: Not on file  ? Highest education level: Not on file  ?Occupational History  ? Not on file  ?Tobacco Use  ? Smoking status: Former  ?  Types: Cigarettes  ?  Quit date: 06/07/2011  ?  Years since quitting: 10.2  ? Smokeless tobacco: Never  ?Substance and Sexual Activity  ? Alcohol use: No  ? Drug use: No  ? Sexual activity: Yes  ?Other Topics Concern  ? Not on file  ?Social History Narrative  ? Not on file  ? ?Social Determinants of Health  ? ?Financial Resource Strain: Not on file  ?Food Insecurity: Not on file  ?Transportation Needs: Not on file  ?Physical Activity: Not on file  ?Stress: Not on file  ?Social Connections: Not on file  ?Intimate Partner Violence: Not on file  ? ? ?SDOH:  ?SDOH Screenings  ? ?Alcohol Screen: Not on file  ?Depression (PHQ2-9): Low Risk   ? PHQ-2 Score: 0  ?Financial Resource Strain: Not on file  ?Food Insecurity: Not on file  ?Housing: Not on file  ?Physical Activity: Not on file  ?Social Connections: Not on file  ?Stress: Not on file  ?Tobacco Use: Medium Risk  ? Smoking Tobacco Use: Former  ? Smokeless Tobacco Use: Never  ? Passive Exposure: Not on file  ?Transportation Needs: Not on file  ? ? ?Last Labs:  ?Admission on 09/19/2021  ?Component Date Value Ref Range Status  ? SARS Coronavirus 2 by RT PCR 09/19/2021 NEGATIVE  NEGATIVE Final  ? Comment: (NOTE) ?SARS-CoV-2 target nucleic acids are NOT DETECTED. ? ?The SARS-CoV-2 RNA is generally detectable in upper respiratory ?specimens during the acute phase of infection. The  lowest ?concentration of SARS-CoV-2 viral copies this assay can detect is ?138 copies/mL. A negative result does not preclude SARS-Cov-2 ?infection and should not be used as the sole basis for treatment or ?other pati

## 2021-09-20 ENCOUNTER — Other Ambulatory Visit: Payer: Self-pay

## 2021-09-20 DIAGNOSIS — R45851 Suicidal ideations: Secondary | ICD-10-CM | POA: Diagnosis not present

## 2021-09-20 DIAGNOSIS — G43911 Migraine, unspecified, intractable, with status migrainosus: Secondary | ICD-10-CM | POA: Diagnosis not present

## 2021-09-20 DIAGNOSIS — G4700 Insomnia, unspecified: Secondary | ICD-10-CM | POA: Diagnosis not present

## 2021-09-20 DIAGNOSIS — F419 Anxiety disorder, unspecified: Secondary | ICD-10-CM | POA: Diagnosis not present

## 2021-09-20 DIAGNOSIS — F29 Unspecified psychosis not due to a substance or known physiological condition: Secondary | ICD-10-CM | POA: Diagnosis not present

## 2021-09-20 DIAGNOSIS — Z62810 Personal history of physical and sexual abuse in childhood: Secondary | ICD-10-CM | POA: Diagnosis not present

## 2021-09-20 DIAGNOSIS — F312 Bipolar disorder, current episode manic severe with psychotic features: Secondary | ICD-10-CM | POA: Diagnosis not present

## 2021-09-20 DIAGNOSIS — Z62811 Personal history of psychological abuse in childhood: Secondary | ICD-10-CM | POA: Diagnosis not present

## 2021-09-20 LAB — RESP PANEL BY RT-PCR (FLU A&B, COVID) ARPGX2
Influenza A by PCR: NEGATIVE
Influenza B by PCR: NEGATIVE
SARS Coronavirus 2 by RT PCR: NEGATIVE

## 2021-09-20 LAB — HEMOGLOBIN A1C
Hgb A1c MFr Bld: 4.8 % (ref 4.8–5.6)
Mean Plasma Glucose: 91.06 mg/dL

## 2021-09-20 LAB — TSH: TSH: 1.817 u[IU]/mL (ref 0.350–4.500)

## 2021-09-20 NOTE — ED Provider Notes (Signed)
FBC/OBS ASAP Discharge Summary ? ?Date and Time: 09/20/2021 12:13 PM  ?Name: Christine Bishop  ?MRN:  [id number]  ? ?Discharge Diagnoses:  ?Final diagnoses:  ?Manic behavior ([place])  ?Acute psychosis ([place])  ?Bipolar affective disorder, current episode manic, current episode severity unspecified ([place])  ? ? ?Subjective:  ?Christine Bishop 56 y.o female present to [facility] by [facility] under IVC,  Per the IVC  respondent stated the TV and FaceBook told her she was going to die,  she thinks she is in a reality show and being filmed.  She say if the camera crew does not stop filming she was going to let the dogs out on them.  She threaten to cut her husband private parts off,  she is spending money that she does not have and also giving it away,  she doesn't sleep,  sh is not able to make decision.  ? ?Stay Summary: Patient was admitted under IVC on 5/2 for observation.  On 5/3 patient was not manic/pressured in her speech however she continued to have disorganized thinking/delusions.  When talking with her she stated multiple times that this was supposed to be her vacation away from work and that she had plans to go to tanning and get her nails done.  She reported no SI, HI, or AVH.  During the interview she minimized yesterday's events saying that her family would not leave her alone and so she claims she stated she would buy another house so that she could have peace and quiet.  She states she is willing to go to outpatient follow-up but that she needs to be discharged/her family needs to come pick her up because she has to go tanning and have her nails done.  She reported that she became manic yesterday due to coming off of her migraine medication and she states that this happened approximately 6 years prior when she was taken off of her last migraine medication (Topamax).  She then reiterated that she needed to just go home because this was her vacation and she was supposed to go tanning and had her nails done.  She reports no  other concerns at present. ? ?Total Time spent with patient: 45 minutes ? ?Past Psychiatric History: Anxiety, One previous similar episode 6 yrs ago. ?Past Medical History:  ?Past Medical History:  ?Diagnosis Date  ? Anxiety   ? Dysplastic nevus 12/24/2019  ? R upper back paraspinal - moderate  ? Dysplastic nevus 12/24/2019  ? R to mid upper back 3.0 cm lat to spine above braline - mild  ? History of migraine headaches   ? Posterior vitreous detachment of left eye 12/14/2019  ? Vitreous membranes and strands, left 04/20/2020  ? Vitrectomy left eye 04-27-20  ?  ?Past Surgical History:  ?Procedure Laterality Date  ? CHOLECYSTECTOMY OPEN    ? LASIK    ? LEEP    ? ?Family History:  ?Family History  ?Problem Relation Age of Onset  ? Cancer Mother   ? Hypertension Father   ? Hyperlipidemia Father   ? Cancer Maternal Grandmother   ? ?Family Psychiatric History: Unable to assess  ?Social History:  ?Social History  ? ?Substance and Sexual Activity  ?Alcohol Use No  ?   ?Social History  ? ?Substance and Sexual Activity  ?Drug Use No  ?  ?Social History  ? ?Socioeconomic History  ? Marital status: Married  ?  Spouse name: Not on file  ? Number of children: Not on file  ?  Years of education: Not on file  ? Highest education level: Not on file  ?Occupational History  ? Not on file  ?Tobacco Use  ? Smoking status: Former  ?  Types: Cigarettes  ?  Quit date: 06/07/2011  ?  Years since quitting: 10.2  ? Smokeless tobacco: Never  ?Substance and Sexual Activity  ? Alcohol use: No  ? Drug use: No  ? Sexual activity: Yes  ?Other Topics Concern  ? Not on file  ?Social History Narrative  ? Not on file  ? ?Social Determinants of Health  ? ?Financial Resource Strain: Not on file  ?Food Insecurity: Not on file  ?Transportation Needs: Not on file  ?Physical Activity: Not on file  ?Stress: Not on file  ?Social Connections: Not on file  ? ?SDOH:  ?SDOH Screenings  ? ?Alcohol Screen: Not on file  ?Depression (PHQ2-9): Low Risk   ? PHQ-2 Score: 0   ?Financial Resource Strain: Not on file  ?Food Insecurity: Not on file  ?Housing: Not on file  ?Physical Activity: Not on file  ?Social Connections: Not on file  ?Stress: Not on file  ?Tobacco Use: Medium Risk  ? Smoking Tobacco Use: Former  ? Smokeless Tobacco Use: Never  ? Passive Exposure: Not on file  ?Transportation Needs: Not on file  ? ? ?Tobacco Cessation:  Prescription not provided because: being transferred to inpatient facility  ? ?Current Medications:  ?Current Facility-Administered Medications  ?Medication Dose Route Frequency Provider Last Rate Last Admin  ? acetaminophen (TYLENOL) tablet 650 mg  650 mg Oral Q6H PRN [name], NP   650 mg at 09/20/21 0914  ? alum & mag hydroxide-simeth (MAALOX/MYLANTA) 200-200-20 MG/5ML suspension 30 mL  30 mL Oral Q4H PRN [name], NP      ? magnesium hydroxide (MILK OF MAGNESIA) suspension 30 mL  30 mL Oral Daily PRN [name], NP      ? ?Current Outpatient Medications  ?Medication Sig Dispense Refill  ? aspirin-acetaminophen-caffeine (EXCEDRIN MIGRAINE) 250-250-65 MG tablet Take 2 tablets by mouth every 6 (six) hours as needed for headache or migraine.    ? b complex vitamins tablet Take 1 tablet by mouth daily.    ? baclofen (LIORESAL) 10 MG tablet Take 10-20 mg by mouth every 8 (eight) hours as needed (For headache or neck pain).    ? busPIRone (BUSPAR) 5 MG tablet Take 5 mg by mouth 2 (two) times daily.    ? diazepam (VALIUM) 10 MG tablet Take 10 mg by mouth daily as needed for anxiety.    ? diclofenac (VOLTAREN) 50 MG EC tablet Take 50 mg by mouth 3 (three) times daily.    ? Diclofenac Potassium,Migraine, 50 MG PACK Take 50 mg by mouth 2 (two) times daily as needed (Mix in 1/4 cup of water and take at onset of headache. May repeat in 2 hours if needed).    ? escitalopram (LEXAPRO) 20 MG tablet Take 1 tablet (20 mg total) by mouth daily. (Patient taking differently: Take 20 mg by mouth at bedtime.) 30 tablet 3  ? fluticasone (FLONASE) 50 MCG/ACT  nasal spray Place 2 sprays into both nostrils daily as needed for allergies.    ? ketorolac (ACULAR) 0.5 % ophthalmic solution INSTILL 1 DROP INTO THE RIGHT EYE 4 TIMES A DAY AS DIRECTED (Patient taking differently: Place 1 drop into the right eye in the morning and at bedtime.) 5 mL 6  ? magnesium gluconate (MAGONATE) 500 MG tablet Take 500 mg by  mouth at bedtime.    ? METRONIDAZOLE, TOPICAL, 0.75 % LOTN Apply a thin coat to the entire face QHS (Patient taking differently: Apply 1 application. topically at bedtime. Apply a thin coat to the entire face) 59 mL 11  ? montelukast (SINGULAIR) 10 MG tablet Take 10 mg by mouth at bedtime as needed (For allergies).    ? Multiple Vitamins-Minerals (PRESERVISION AREDS PO) Take 1 tablet by mouth in the morning and at bedtime.    ? ondansetron (ZOFRAN-ODT) 8 MG disintegrating tablet Take 8 mg by mouth every 8 (eight) hours as needed for nausea or vomiting.    ? oxyCODONE-acetaminophen (PERCOCET/ROXICET) 5-325 MG tablet Take 1 tablet by mouth every 4 (four) hours as needed (For pain).    ? Propylene Glycol (SYSTANE COMPLETE) 0.6 % SOLN Place 2 drops into both eyes daily as needed (For dry eyes).    ? psyllium (METAMUCIL) 58.6 % powder Take 1 packet by mouth daily.    ? QULIPTA 60 MG TABS Take 60 mg by mouth at bedtime.    ? rosuvastatin (CRESTOR) 5 MG tablet Take 5 mg by mouth at bedtime.    ? SUMAtriptan 6 MG/0.5ML SOAJ Inject 6 mg into the skin every 2 (two) hours as needed (Inject at onset of headache, may repeat in 2 hours if needed but no more than 2 in 24 hours).  6  ? tiZANidine (ZANAFLEX) 4 MG tablet Take 2-4 mg by mouth at bedtime as needed for muscle spasms.    ? traZODone (DESYREL) 50 MG tablet TAKE 0.5-1 TABLETS BY MOUTH AT BEDTIME AS NEEDED FOR SLEEP. (Patient taking differently: Take 25-50 mg by mouth at bedtime as needed for sleep.) 90 tablet 0  ? ? ?PTA Medications: (Not in a hospital admission) ? ? ?Musculoskeletal  ?Strength & Muscle Tone: within normal  limits ?Gait & Station: normal ?Patient leans: N/A ? ?Psychiatric Specialty Exam  ?Presentation  ?General Appearance: Bizarre; Casual ? ?Eye Contact:Poor ? ?Speech:Clear and Coherent ? ?Speech Volume:Decreased ? ?

## 2021-09-20 NOTE — Progress Notes (Signed)
Patient transferred to [facility] via [facility] non emergent transport.  Patient made aware of plan and was agreeable to it Patient became tearful when [employer] arrived and she was brought to [facility].  Support given.  Patient remained illogical, grandiose and delusional.  [employer] given [place], transfer form, first exam [place] and 3 copies of IVC paperwork.  Patient ambulated out of facility.   ?

## 2021-09-20 NOTE — ED Notes (Signed)
Pt sleeping at present, no distress noted.  Monitoring for safety. 

## 2021-09-20 NOTE — ED Notes (Signed)
Pt under IVC by husband, presents with delusional behavior.  Pt thinks she is being filmed for a documentary.  Pt believes as per husband that Facebook & TV told her she is going to die.  Pt anxious and redirectable. No distress noted at present.  Monitoring for safety. ?

## 2021-09-20 NOTE — Progress Notes (Signed)
Patient has been faxed out after being denied by [facility] due to no appropriate beds available. Patient meets [facility] inpatient criteria per Kai Levins, MD. Patient has been faxed out to the following facilities:  ? ?[facility]  [address] [place] [address] [phone]  ?[facility]  [address] [place] [address] [phone]  ?[facility]  [address], [place] [address] [phone]  ?[facility]  [address], [place] [address] [phone]  ?[place] [place] [address] [phone]  ?[facility] [address], [place] [address] [phone]  ?[facility]  [place], [place] [address] [phone]  ?[facility]  [place], [place] [address] [phone]  ?[facility]  [address] [place] [address] [phone]  ?[place] [address] [place] [address] [phone]  ?[facility]  [address] [place] [address] [phone]  ?[facility]  [address] [place] [address] [phone]  ?[facility]  [address] [place] [address] [phone]  ? ?Mariea Clonts, MSW, LCSW-A  ?11:52 AM 09/20/2021   ?

## 2021-09-20 NOTE — Progress Notes (Signed)
[facility] LCSW Progress Note ?  ?09/20/2021    12:10 PM ? ?Christine Bishop  ? ?[id number]  ? ?Type of Contact and Topic:  Psychiatric Bed Placement  ? ?Pt accepted to [facility]    ? ?Patient meets inpatient criteria per Kai Levins, MD   ? ?The attending provider will be Dr. Macky Lower ? ?Call report to [phone] ? ?Florina Ou, RN @ [facility] notified.    ? ?Pt scheduled  to arrive at [facility] ANYTIME. Please fax IVC paperwork to [phone] prior to transporting this Pt.  ? ? ?Mariea Clonts, MSW, LCSW-A  ?12:12 PM 09/20/2021   ? ?  ?  ? ? ? ? ?  ?

## 2021-09-20 NOTE — ED Notes (Signed)
Patient has been awake and is alert.  She has been needy, anxious and intrusive with poor boundaries.  Patient attempted to insert herself in an event involving another patient.  She accepted limits and redirection.  Patient is grandiose with rapid speech and thought.  She is hyperverbal with limited stress tolerance.  Patient requesting ativan and migraine medication.  She was seen and evaluated by DR. Pashayan.  No new order at this time.  Will monitor and provide safe supportive environment for her.  Will redirect as needed.   ?

## 2021-09-20 NOTE — Discharge Instructions (Signed)
You will be transferred to [facility] for inpatient treatment. ?

## 2021-09-21 DIAGNOSIS — F4310 Post-traumatic stress disorder, unspecified: Secondary | ICD-10-CM | POA: Diagnosis not present

## 2021-09-21 DIAGNOSIS — F319 Bipolar disorder, unspecified: Secondary | ICD-10-CM | POA: Diagnosis not present

## 2021-09-21 DIAGNOSIS — F411 Generalized anxiety disorder: Secondary | ICD-10-CM | POA: Diagnosis not present

## 2021-09-21 DIAGNOSIS — F29 Unspecified psychosis not due to a substance or known physiological condition: Secondary | ICD-10-CM | POA: Diagnosis not present

## 2021-09-22 ENCOUNTER — Ambulatory Visit: Payer: Federal, State, Local not specified - PPO | Admitting: Nurse Practitioner

## 2021-09-22 DIAGNOSIS — F29 Unspecified psychosis not due to a substance or known physiological condition: Secondary | ICD-10-CM | POA: Diagnosis not present

## 2021-09-23 DIAGNOSIS — F29 Unspecified psychosis not due to a substance or known physiological condition: Secondary | ICD-10-CM | POA: Diagnosis not present

## 2021-09-24 DIAGNOSIS — F29 Unspecified psychosis not due to a substance or known physiological condition: Secondary | ICD-10-CM | POA: Diagnosis not present

## 2021-09-25 DIAGNOSIS — F29 Unspecified psychosis not due to a substance or known physiological condition: Secondary | ICD-10-CM | POA: Diagnosis not present

## 2021-09-26 DIAGNOSIS — F29 Unspecified psychosis not due to a substance or known physiological condition: Secondary | ICD-10-CM | POA: Diagnosis not present

## 2021-09-27 DIAGNOSIS — F29 Unspecified psychosis not due to a substance or known physiological condition: Secondary | ICD-10-CM | POA: Diagnosis not present

## 2021-09-28 DIAGNOSIS — F29 Unspecified psychosis not due to a substance or known physiological condition: Secondary | ICD-10-CM | POA: Diagnosis not present

## 2021-09-29 DIAGNOSIS — F29 Unspecified psychosis not due to a substance or known physiological condition: Secondary | ICD-10-CM | POA: Diagnosis not present

## 2021-10-03 DIAGNOSIS — M5481 Occipital neuralgia: Secondary | ICD-10-CM | POA: Diagnosis not present

## 2021-10-11 ENCOUNTER — Ambulatory Visit: Payer: Federal, State, Local not specified - PPO | Admitting: Nurse Practitioner

## 2021-10-12 ENCOUNTER — Encounter: Payer: Self-pay | Admitting: Nurse Practitioner

## 2021-10-12 ENCOUNTER — Ambulatory Visit: Payer: Federal, State, Local not specified - PPO | Admitting: Nurse Practitioner

## 2021-10-12 VITALS — BP 92/60 | HR 83 | Temp 97.3°F | Ht 66.0 in | Wt 176.4 lb

## 2021-10-12 DIAGNOSIS — M549 Dorsalgia, unspecified: Secondary | ICD-10-CM | POA: Diagnosis not present

## 2021-10-12 MED ORDER — CYCLOBENZAPRINE HCL 10 MG PO TABS: 10.0000 mg | ORAL_TABLET | Freq: Every evening | ORAL | 1 refills | Status: DC | PRN

## 2021-10-12 MED ORDER — KETOROLAC TROMETHAMINE 10 MG PO TABS: 10.0000 mg | ORAL_TABLET | Freq: Four times a day (QID) | ORAL | 0 refills | Status: DC | PRN

## 2021-10-12 MED ORDER — PREDNISONE 10 MG (48) PO TBPK: ORAL_TABLET | ORAL | 0 refills | Status: DC

## 2021-10-12 MED ORDER — KETOROLAC TROMETHAMINE 60 MG/2ML IM SOLN
60.0000 mg | Freq: Once | INTRAMUSCULAR | Status: AC
  Administered 2021-10-12: 60 mg via INTRAMUSCULAR

## 2021-10-12 NOTE — Progress Notes (Signed)
Established patient visit   Patient: Christine Bishop   DOB: 01-31-1966   56 y.o. Female  MRN: [id number] Visit Date: 10/12/2021  Chief Complaint  Patient presents with   Back Pain   Subjective    HPI  The patient is here due to acute back pain -she was in [facility] on May 1. Was told she had thoracic vertebrae and cervical spine.  -pain is in center of her back. Is present if she sits or stands. Was at a pain level of 3 or 4 for a little while. Now, pain is about 7 or 8. She states that pain is present at all times. She states that she does have pain medication left over from her ER visit. She does not want to have narcotic pain medication. But she does need some pain management until she sees orthopedics.  -pain has become so bad that she had to leave work today. She does not feel as though she is able to work at this time due to the severity of her pain.  -she is unable to see orthopedic provider until 10/26/2021. She will see PA with [employer] in [place].   Medications: Outpatient Medications Prior to Visit  Medication Sig   aspirin-acetaminophen-caffeine (EXCEDRIN MIGRAINE) 250-250-65 MG tablet Take 2 tablets by mouth every 6 (six) hours as needed for headache or migraine.   b complex vitamins tablet Take 1 tablet by mouth daily.   busPIRone (BUSPAR) 5 MG tablet Take 5 mg by mouth 2 (two) times daily.   diazepam (VALIUM) 10 MG tablet Take 10 mg by mouth daily as needed for anxiety.   diclofenac (VOLTAREN) 50 MG EC tablet Take 50 mg by mouth 3 (three) times daily.   Diclofenac Potassium,Migraine, 50 MG PACK Take 50 mg by mouth 2 (two) times daily as needed (Mix in 1/4 cup of water and take at onset of headache. May repeat in 2 hours if needed).   fluticasone (FLONASE) 50 MCG/ACT nasal spray Place 2 sprays into both nostrils daily as needed for allergies.   ketorolac (ACULAR) 0.5 % ophthalmic solution INSTILL 1 DROP INTO THE RIGHT EYE 4 TIMES A DAY AS DIRECTED (Patient taking  differently: Place 1 drop into the right eye in the morning and at bedtime.)   magnesium gluconate (MAGONATE) 500 MG tablet Take 500 mg by mouth at bedtime.   METRONIDAZOLE, TOPICAL, 0.75 % LOTN Apply a thin coat to the entire face QHS (Patient taking differently: Apply 1 application. topically at bedtime. Apply a thin coat to the entire face)   montelukast (SINGULAIR) 10 MG tablet Take 10 mg by mouth at bedtime as needed (For allergies).   Multiple Vitamins-Minerals (PRESERVISION AREDS PO) Take 1 tablet by mouth in the morning and at bedtime.   OLANZapine (ZYPREXA) 10 MG tablet Take by mouth.   ondansetron (ZOFRAN-ODT) 8 MG disintegrating tablet Take 8 mg by mouth every 8 (eight) hours as needed for nausea or vomiting.   oxyCODONE-acetaminophen (PERCOCET/ROXICET) 5-325 MG tablet Take 1 tablet by mouth every 4 (four) hours as needed (For pain).   Propylene Glycol (SYSTANE COMPLETE) 0.6 % SOLN Place 2 drops into both eyes daily as needed (For dry eyes).   psyllium (METAMUCIL) 58.6 % powder Take 1 packet by mouth daily.   QULIPTA 60 MG TABS Take 60 mg by mouth at bedtime.   rosuvastatin (CRESTOR) 5 MG tablet Take 5 mg by mouth at bedtime.   SUMAtriptan 6 MG/0.5ML SOAJ Inject 6 mg into the skin every  2 (two) hours as needed (Inject at onset of headache, may repeat in 2 hours if needed but no more than 2 in 24 hours).   tiZANidine (ZANAFLEX) 4 MG tablet Take 2-4 mg by mouth at bedtime as needed for muscle spasms.   traZODone (DESYREL) 50 MG tablet TAKE 0.5-1 TABLETS BY MOUTH AT BEDTIME AS NEEDED FOR SLEEP. (Patient taking differently: Take 25-50 mg by mouth at bedtime as needed for sleep.)   [DISCONTINUED] baclofen (LIORESAL) 10 MG tablet Take 10-20 mg by mouth every 8 (eight) hours as needed (For headache or neck pain).   escitalopram (LEXAPRO) 20 MG tablet Take 1 tablet (20 mg total) by mouth daily. (Patient not taking: Reported on 10/12/2021)   No facility-administered medications prior to visit.     Review of Systems  Constitutional:  Positive for activity change. Negative for appetite change, chills, fatigue and fever.       Patient having pain in mid back which is radiating down the back and into legs. Hurts with sitting and standing. Also having neck pain and tenderness.   HENT:  Negative for congestion, postnasal drip, rhinorrhea, sinus pressure, sinus pain, sneezing and sore throat.   Eyes: Negative.   Respiratory:  Negative for cough, chest tightness, shortness of breath and wheezing.   Cardiovascular:  Negative for chest pain and palpitations.  Gastrointestinal:  Negative for abdominal pain, constipation, diarrhea, nausea and vomiting.  Endocrine: Negative for cold intolerance, heat intolerance, polydipsia and polyuria.  Genitourinary:  Negative for dyspareunia, dysuria, flank pain, frequency and urgency.  Musculoskeletal:  Positive for arthralgias, back pain, neck pain and neck stiffness. Negative for myalgias.       Patient having pain in mid back which is radiating down the back and into legs. Hurts with sitting and standing. Also having neck pain and tenderness.   Skin:  Negative for rash.  Allergic/Immunologic: Negative for environmental allergies.  Neurological:  Negative for dizziness, weakness and headaches.  Hematological:  Negative for adenopathy.  Psychiatric/Behavioral:  The patient is nervous/anxious.        Patient had recent hospitalization for mental health. Has first visit with psychiatry tomorrow.     Objective     Today's Vitals   10/12/21 1113  BP: 92/60  Pulse: 83  Temp: (!) 97.3 F (36.3 C)  SpO2: 95%  Weight: 176 lb 6.4 oz (80 kg)  Height: '5\' 6"'$  (1.676 m)   Body mass index is 28.47 kg/m.   Physical Exam Vitals and nursing note reviewed.  Constitutional:      Appearance: Normal appearance. She is well-developed.     Comments: Patient appears to be in pain. Unable to sit or stand for longer that one to two minutes.   HENT:     Head:  Normocephalic and atraumatic.  Eyes:     Pupils: Pupils are equal, round, and reactive to light.  Cardiovascular:     Rate and Rhythm: Normal rate and regular rhythm.     Pulses: Normal pulses.     Heart sounds: Normal heart sounds.  Pulmonary:     Effort: Pulmonary effort is normal.     Breath sounds: Normal breath sounds.  Abdominal:     Palpations: Abdomen is soft.  Musculoskeletal:        General: Normal range of motion.     Cervical back: Normal range of motion and neck supple. Pain with movement, spinous process tenderness and muscular tenderness present.       Back:  Lymphadenopathy:  Cervical: No cervical adenopathy.  Skin:    General: Skin is warm and dry.     Capillary Refill: Capillary refill takes less than 2 seconds.  Neurological:     General: No focal deficit present.     Mental Status: She is alert and oriented to person, place, and time.  Psychiatric:        Mood and Affect: Mood normal.        Behavior: Behavior normal.        Thought Content: Thought content normal.        Judgment: Judgment normal.      Assessment & Plan     1. Back pain due to injury Toradol 30 mg injection given IM during today's visit. She tolerated this well. Will follow with toradol '10mg'$  oral up to 4 times daily as needed for the next 5 days. Add prednisone taper - take as directed for 12 days. Flexeril '10mg'$  may be taken at bedtime as needed for muscle pain and tightness. Patient will follow up with orthopedics provider on 10/26/2021. Will forward FMLA paperwork to me to complete so that absences she is having due to pain do not effect her employment.  - predniSONE (STERAPRED UNI-PAK 48 TAB) 10 MG (48) TBPK tablet; 12 day taper - take by mouth as directed for 12 days  Dispense: 48 tablet; Refill: 0 - cyclobenzaprine (FLEXERIL) 10 MG tablet; Take 1 tablet (10 mg total) by mouth at bedtime as needed for muscle spasms.  Dispense: 30 tablet; Refill: 1 - ketorolac (TORADOL) 10 MG tablet;  Take 1 tablet (10 mg total) by mouth every 6 (six) hours as needed.  Dispense: 20 tablet; Refill: 0 - ketorolac (TORADOL) injection 60 mg   Return for prn worsening or persistent symptoms - can we et records from ER visit at [facility] hospital [facility].        Ronnell Freshwater, NP  [facility] at [facility] [phone] (phone) [phone] (fax)  [place]

## 2021-10-13 DIAGNOSIS — F309 Manic episode, unspecified: Secondary | ICD-10-CM | POA: Diagnosis not present

## 2021-10-13 DIAGNOSIS — F419 Anxiety disorder, unspecified: Secondary | ICD-10-CM | POA: Diagnosis not present

## 2021-10-19 ENCOUNTER — Telehealth: Payer: Self-pay | Admitting: Nurse Practitioner

## 2021-10-19 NOTE — Telephone Encounter (Signed)
Called pt she is advised of her PW

## 2021-10-19 NOTE — Telephone Encounter (Signed)
Patient calling requesting status of FMLA paperwork. AS, CMA

## 2021-10-23 ENCOUNTER — Other Ambulatory Visit: Payer: Self-pay | Admitting: Nurse Practitioner

## 2021-10-23 DIAGNOSIS — F5101 Primary insomnia: Secondary | ICD-10-CM

## 2021-10-27 DIAGNOSIS — M546 Pain in thoracic spine: Secondary | ICD-10-CM | POA: Diagnosis not present

## 2021-11-06 ENCOUNTER — Ambulatory Visit: Payer: Federal, State, Local not specified - PPO | Admitting: Nurse Practitioner

## 2021-11-20 DIAGNOSIS — F419 Anxiety disorder, unspecified: Secondary | ICD-10-CM | POA: Diagnosis not present

## 2021-11-20 DIAGNOSIS — F29 Unspecified psychosis not due to a substance or known physiological condition: Secondary | ICD-10-CM | POA: Diagnosis not present

## 2021-11-20 DIAGNOSIS — F309 Manic episode, unspecified: Secondary | ICD-10-CM | POA: Diagnosis not present

## 2021-11-20 DIAGNOSIS — G43001 Migraine without aura, not intractable, with status migrainosus: Secondary | ICD-10-CM | POA: Diagnosis not present

## 2021-12-07 ENCOUNTER — Other Ambulatory Visit: Payer: Self-pay | Admitting: Nurse Practitioner

## 2021-12-07 DIAGNOSIS — M549 Dorsalgia, unspecified: Secondary | ICD-10-CM

## 2021-12-21 DIAGNOSIS — M546 Pain in thoracic spine: Secondary | ICD-10-CM | POA: Diagnosis not present

## 2021-12-26 DIAGNOSIS — Z79899 Other long term (current) drug therapy: Secondary | ICD-10-CM | POA: Diagnosis not present

## 2021-12-26 DIAGNOSIS — F29 Unspecified psychosis not due to a substance or known physiological condition: Secondary | ICD-10-CM | POA: Diagnosis not present

## 2021-12-26 DIAGNOSIS — F411 Generalized anxiety disorder: Secondary | ICD-10-CM | POA: Diagnosis not present

## 2021-12-26 DIAGNOSIS — F3173 Bipolar disorder, in partial remission, most recent episode manic: Secondary | ICD-10-CM | POA: Diagnosis not present

## 2022-01-18 ENCOUNTER — Encounter: Payer: Self-pay | Admitting: Dermatology

## 2022-01-18 ENCOUNTER — Ambulatory Visit (INDEPENDENT_AMBULATORY_CARE_PROVIDER_SITE_OTHER): Payer: Federal, State, Local not specified - PPO | Admitting: Dermatology

## 2022-01-18 DIAGNOSIS — S30861A Insect bite (nonvenomous) of abdominal wall, initial encounter: Secondary | ICD-10-CM

## 2022-01-18 DIAGNOSIS — L821 Other seborrheic keratosis: Secondary | ICD-10-CM

## 2022-01-18 DIAGNOSIS — Z1283 Encounter for screening for malignant neoplasm of skin: Secondary | ICD-10-CM | POA: Diagnosis not present

## 2022-01-18 DIAGNOSIS — W57XXXA Bitten or stung by nonvenomous insect and other nonvenomous arthropods, initial encounter: Secondary | ICD-10-CM

## 2022-01-18 DIAGNOSIS — D239 Other benign neoplasm of skin, unspecified: Secondary | ICD-10-CM

## 2022-01-18 DIAGNOSIS — D229 Melanocytic nevi, unspecified: Secondary | ICD-10-CM

## 2022-01-18 DIAGNOSIS — L578 Other skin changes due to chronic exposure to nonionizing radiation: Secondary | ICD-10-CM

## 2022-01-18 DIAGNOSIS — L814 Other melanin hyperpigmentation: Secondary | ICD-10-CM

## 2022-01-18 DIAGNOSIS — Z86018 Personal history of other benign neoplasm: Secondary | ICD-10-CM

## 2022-01-18 DIAGNOSIS — D1800 Hemangioma unspecified site: Secondary | ICD-10-CM

## 2022-01-18 MED ORDER — DOXYCYCLINE HYCLATE 100 MG PO TABS: ORAL_TABLET | ORAL | 0 refills | Status: DC

## 2022-01-18 NOTE — Progress Notes (Signed)
Follow-Up Visit   Subjective  Christine Bishop is a 56 y.o. female who presents for the following: Annual Exam. Hx of Dysplastic nevus The patient presents for Total-Body Skin Exam (TBSE) for skin cancer screening and mole check.  The patient has spots, moles and lesions to be evaluated, some may be new or changing and the patient has concerns that these could be cancer.  Pt c/o of a tick bite on the left abdomen area, she removed a tick from this area 2 days ago, no fevers, chills or sweats.   The following portions of the chart were reviewed this encounter and updated as appropriate:   Tobacco  Allergies  Meds  Problems  Med Hx  Surg Hx  Fam Hx     Review of Systems:  No other skin or systemic complaints except as noted in HPI or Assessment and Plan.  Objective  Well appearing patient in no apparent distress; mood and affect are within normal limits.  A full examination was performed including scalp, head, eyes, ears, nose, lips, neck, chest, axillae, abdomen, back, buttocks, bilateral upper extremities, bilateral lower extremities, hands, feet, fingers, toes, fingernails, and toenails. All findings within normal limits unless otherwise noted below.  left abdomen Pink patch, no sign of a tick   Assessment & Plan  Tick bite of abdomen, initial encounter left abdomen  Recommend taking at least one week worth of Doxycycline for prophylaxis of tickborne disease.  Gave a full months worth if she decided to go for a full month. Start Doxycycline 100 mg take 1 tablet twice a day with food,   Doxycycline should be taken with food to prevent nausea. Do not lay down for 30 minutes after taking. Be cautious with sun exposure and use good sun protection while on this medication. Pregnant women should not take this medication.     Related Medications doxycycline (VIBRA-TABS) 100 MG tablet Take 1 tablet twice a day with food  Lentigines - Scattered tan macules - Due to sun  exposure - Benign-appearing, observe - Recommend daily broad spectrum sunscreen SPF 30+ to sun-exposed areas, reapply every 2 hours as needed. - Call for any changes  Seborrheic Keratoses - Stuck-on, waxy, tan-brown papules and/or plaques  - Benign-appearing - Discussed benign etiology and prognosis. - Observe - Call for any changes  Melanocytic Nevi - Tan-brown and/or pink-flesh-colored symmetric macules and papules - Benign appearing on exam today - Observation - Call clinic for new or changing moles - Recommend daily use of broad spectrum spf 30+ sunscreen to sun-exposed areas.   Hemangiomas - Red papules - Discussed benign nature - Observe - Call for any changes  Actinic Damage - Chronic condition, secondary to cumulative UV/sun exposure - diffuse scaly erythematous macules with underlying dyspigmentation - Recommend daily broad spectrum sunscreen SPF 30+ to sun-exposed areas, reapply every 2 hours as needed.  - Staying in the shade or wearing long sleeves, sun glasses (UVA+UVB protection) and wide brim hats (4-inch brim around the entire circumference of the hat) are also recommended for sun protection.  - Call for new or changing lesions.  History of Dysplastic Nevi Multiple see history - No evidence of recurrence today - Recommend regular full body skin exams - Recommend daily broad spectrum sunscreen SPF 30+ to sun-exposed areas, reapply every 2 hours as needed.  - Call if any new or changing lesions are noted between office visits   Skin cancer screening performed today.   Return in about 1 year (around  01/19/2023) for TBSE, hx of Dysplastic nevus .  IMarye Round, CMA, am acting as scribe for Sarina Ser, MD .  Documentation: I have reviewed the above documentation for accuracy and completeness, and I agree with the above.  Sarina Ser, MD

## 2022-01-18 NOTE — Patient Instructions (Signed)
Due to recent changes in healthcare laws, you may see results of your pathology and/or laboratory studies on MyChart before the doctors have had a chance to review them. We understand that in some cases there may be results that are confusing or concerning to you. Please understand that not all results are received at the same time and often the doctors may need to interpret multiple results in order to provide you with the best plan of care or course of treatment. Therefore, we ask that you please give us 2 business days to thoroughly review all your results before contacting the office for clarification. Should we see a critical lab result, you will be contacted sooner.   If You Need Anything After Your Visit  If you have any questions or concerns for your doctor, please call our main line at [phone] and press option 4 to reach your doctor's medical assistant. If no one answers, please leave a voicemail as directed and we will return your call as soon as possible. Messages left after 4 pm will be answered the following business day.   You may also send us a message via MyChart. We typically respond to MyChart messages within 1-2 business days.  For prescription refills, please ask your pharmacy to contact our office. Our fax number is [phone].  If you have an urgent issue when the clinic is closed that cannot wait until the next business day, you can page your doctor at the number below.    Please note that while we do our best to be available for urgent issues outside of office hours, we are not available 24/7.   If you have an urgent issue and are unable to reach us, you may choose to seek medical care at your doctor's office, retail clinic, urgent care center, or emergency room.  If you have a medical emergency, please immediately call 911 or go to the emergency department.  Pager Numbers  - Dr. Kowalski: [phone]  - Dr. Moye: [phone]  - Dr. Stewart:  [phone]  In the event of inclement weather, please call our main line at [phone] for an update on the status of any delays or closures.  Dermatology Medication Tips: Please keep the boxes that topical medications come in in order to help keep track of the instructions about where and how to use these. Pharmacies typically print the medication instructions only on the boxes and not directly on the medication tubes.   If your medication is too expensive, please contact our office at [phone] option 4 or send us a message through MyChart.   We are unable to tell what your co-pay for medications will be in advance as this is different depending on your insurance coverage. However, we may be able to find a substitute medication at lower cost or fill out paperwork to get insurance to cover a needed medication.   If a prior authorization is required to get your medication covered by your insurance company, please allow us 1-2 business days to complete this process.  Drug prices often vary depending on where the prescription is filled and some pharmacies may offer cheaper prices.  The website www.goodrx.com contains coupons for medications through different pharmacies. The prices here do not account for what the cost may be with help from insurance (it may be cheaper with your insurance), but the website can give you the price if you did not use any insurance.  - You can print the associated coupon and take it with   your prescription to the pharmacy.  - You may also stop by our office during regular business hours and pick up a GoodRx coupon card.  - If you need your prescription sent electronically to a different pharmacy, notify our office through [place] [facility] MyChart or by phone at [phone] option 4.     Si Usted Necesita Algo Despus de Su Visita  Tambin puede enviarnos un mensaje a travs de MyChart. Por lo general respondemos a los mensajes de MyChart en el transcurso de 1 a 2  das hbiles.  Para renovar recetas, por favor pida a su farmacia que se ponga en contacto con nuestra oficina. Nuestro nmero de fax es el [phone].  Si tiene un asunto urgente cuando la clnica est cerrada y que no puede esperar hasta el siguiente da hbil, puede llamar/localizar a su doctor(a) al nmero que aparece a continuacin.   Por favor, tenga en cuenta que aunque hacemos todo lo posible para estar disponibles para asuntos urgentes fuera del horario de oficina, no estamos disponibles las 24 horas del da, los 7 das de la semana.   Si tiene un problema urgente y no puede comunicarse con nosotros, puede optar por buscar atencin mdica  en el consultorio de su doctor(a), en una clnica privada, en un centro de atencin urgente o en una sala de emergencias.  Si tiene una emergencia mdica, por favor llame inmediatamente al 911 o vaya a la sala de emergencias.  Nmeros de bper  - Dr. Kowalski: [phone]  - Dra. Moye: [phone]  - Dra. Stewart: [phone]  En caso de inclemencias del tiempo, por favor llame a nuestra lnea principal al [phone] para una actualizacin sobre el estado de cualquier retraso o cierre.  Consejos para la medicacin en dermatologa: Por favor, guarde las cajas en las que vienen los medicamentos de uso tpico para ayudarle a seguir las instrucciones sobre dnde y cmo usarlos. Las farmacias generalmente imprimen las instrucciones del medicamento slo en las cajas y no directamente en los tubos del medicamento.   Si su medicamento es muy caro, por favor, pngase en contacto con nuestra oficina llamando al [phone] y presione la opcin 4 o envenos un mensaje a travs de MyChart.   No podemos decirle cul ser su copago por los medicamentos por adelantado ya que esto es diferente dependiendo de la cobertura de su seguro. Sin embargo, es posible que podamos encontrar un medicamento sustituto a menor costo o llenar un formulario para que el  seguro cubra el medicamento que se considera necesario.   Si se requiere una autorizacin previa para que su compaa de seguros cubra su medicamento, por favor permtanos de 1 a 2 das hbiles para completar este proceso.  Los precios de los medicamentos varan con frecuencia dependiendo del lugar de dnde se surte la receta y alguna farmacias pueden ofrecer precios ms baratos.  El sitio web www.goodrx.com tiene cupones para medicamentos de diferentes farmacias. Los precios aqu no tienen en cuenta lo que podra costar con la ayuda del seguro (puede ser ms barato con su seguro), pero el sitio web puede darle el precio si no utiliz ningn seguro.  - Puede imprimir el cupn correspondiente y llevarlo con su receta a la farmacia.  - Tambin puede pasar por nuestra oficina durante el horario de atencin regular y recoger una tarjeta de cupones de GoodRx.  - Si necesita que su receta se enve electrnicamente a una farmacia diferente, informe a nuestra oficina a travs de MyChart de [place]   o por telfono llamando al [phone] y presione la opcin 4.  

## 2022-01-30 DIAGNOSIS — F3173 Bipolar disorder, in partial remission, most recent episode manic: Secondary | ICD-10-CM | POA: Diagnosis not present

## 2022-01-30 DIAGNOSIS — Z79899 Other long term (current) drug therapy: Secondary | ICD-10-CM | POA: Diagnosis not present

## 2022-01-30 DIAGNOSIS — F411 Generalized anxiety disorder: Secondary | ICD-10-CM | POA: Diagnosis not present

## 2022-01-30 DIAGNOSIS — F29 Unspecified psychosis not due to a substance or known physiological condition: Secondary | ICD-10-CM | POA: Diagnosis not present

## 2022-02-12 DIAGNOSIS — Z79899 Other long term (current) drug therapy: Secondary | ICD-10-CM | POA: Diagnosis not present

## 2022-02-12 DIAGNOSIS — F3174 Bipolar disorder, in full remission, most recent episode manic: Secondary | ICD-10-CM | POA: Diagnosis not present

## 2022-02-12 DIAGNOSIS — F29 Unspecified psychosis not due to a substance or known physiological condition: Secondary | ICD-10-CM | POA: Diagnosis not present

## 2022-02-12 DIAGNOSIS — F411 Generalized anxiety disorder: Secondary | ICD-10-CM | POA: Diagnosis not present

## 2022-02-16 DIAGNOSIS — N390 Urinary tract infection, site not specified: Secondary | ICD-10-CM | POA: Diagnosis not present

## 2022-02-24 ENCOUNTER — Emergency Department (HOSPITAL_COMMUNITY)
Admission: EM | Admit: 2022-02-24 | Discharge: 2022-02-26 | Disposition: A | Discharge status: Home / Self Care | Payer: Federal, State, Local not specified - PPO | Source: Home / Self Care | Attending: Emergency Medicine | Admitting: Emergency Medicine

## 2022-02-24 ENCOUNTER — Emergency Department (HOSPITAL_COMMUNITY): Payer: Federal, State, Local not specified - PPO

## 2022-02-24 ENCOUNTER — Other Ambulatory Visit: Payer: Self-pay

## 2022-02-24 DIAGNOSIS — F329 Major depressive disorder, single episode, unspecified: Secondary | ICD-10-CM | POA: Diagnosis not present

## 2022-02-24 DIAGNOSIS — Z9049 Acquired absence of other specified parts of digestive tract: Secondary | ICD-10-CM | POA: Diagnosis not present

## 2022-02-24 DIAGNOSIS — Z20822 Contact with and (suspected) exposure to covid-19: Secondary | ICD-10-CM | POA: Diagnosis not present

## 2022-02-24 DIAGNOSIS — F322 Major depressive disorder, single episode, severe without psychotic features: Secondary | ICD-10-CM

## 2022-02-24 DIAGNOSIS — E785 Hyperlipidemia, unspecified: Secondary | ICD-10-CM | POA: Diagnosis not present

## 2022-02-24 DIAGNOSIS — R45851 Suicidal ideations: Secondary | ICD-10-CM | POA: Insufficient documentation

## 2022-02-24 DIAGNOSIS — R9431 Abnormal electrocardiogram [ECG] [EKG]: Secondary | ICD-10-CM | POA: Diagnosis not present

## 2022-02-24 DIAGNOSIS — Z7982 Long term (current) use of aspirin: Secondary | ICD-10-CM | POA: Insufficient documentation

## 2022-02-24 DIAGNOSIS — F3164 Bipolar disorder, current episode mixed, severe, with psychotic features: Secondary | ICD-10-CM

## 2022-02-24 DIAGNOSIS — Z818 Family history of other mental and behavioral disorders: Secondary | ICD-10-CM | POA: Diagnosis not present

## 2022-02-24 DIAGNOSIS — R197 Diarrhea, unspecified: Secondary | ICD-10-CM | POA: Diagnosis not present

## 2022-02-24 DIAGNOSIS — R41843 Psychomotor deficit: Secondary | ICD-10-CM | POA: Diagnosis not present

## 2022-02-24 DIAGNOSIS — F333 Major depressive disorder, recurrent, severe with psychotic symptoms: Secondary | ICD-10-CM | POA: Insufficient documentation

## 2022-02-24 DIAGNOSIS — T1491XA Suicide attempt, initial encounter: Secondary | ICD-10-CM

## 2022-02-24 DIAGNOSIS — Z79899 Other long term (current) drug therapy: Secondary | ICD-10-CM | POA: Diagnosis not present

## 2022-02-24 DIAGNOSIS — F411 Generalized anxiety disorder: Secondary | ICD-10-CM | POA: Diagnosis not present

## 2022-02-24 DIAGNOSIS — T438X2A Poisoning by other psychotropic drugs, intentional self-harm, initial encounter: Secondary | ICD-10-CM

## 2022-02-24 DIAGNOSIS — Z9151 Personal history of suicidal behavior: Secondary | ICD-10-CM | POA: Diagnosis not present

## 2022-02-24 DIAGNOSIS — F332 Major depressive disorder, recurrent severe without psychotic features: Secondary | ICD-10-CM

## 2022-02-24 DIAGNOSIS — Z1152 Encounter for screening for COVID-19: Secondary | ICD-10-CM | POA: Insufficient documentation

## 2022-02-24 DIAGNOSIS — G4700 Insomnia, unspecified: Secondary | ICD-10-CM | POA: Diagnosis not present

## 2022-02-24 DIAGNOSIS — D696 Thrombocytopenia, unspecified: Secondary | ICD-10-CM | POA: Insufficient documentation

## 2022-02-24 DIAGNOSIS — G43909 Migraine, unspecified, not intractable, without status migrainosus: Secondary | ICD-10-CM | POA: Diagnosis not present

## 2022-02-24 DIAGNOSIS — R1111 Vomiting without nausea: Secondary | ICD-10-CM | POA: Diagnosis not present

## 2022-02-24 DIAGNOSIS — Z87891 Personal history of nicotine dependence: Secondary | ICD-10-CM | POA: Diagnosis not present

## 2022-02-24 DIAGNOSIS — Z888 Allergy status to other drugs, medicaments and biological substances status: Secondary | ICD-10-CM | POA: Diagnosis not present

## 2022-02-24 DIAGNOSIS — R400 Somnolence: Secondary | ICD-10-CM | POA: Diagnosis not present

## 2022-02-24 DIAGNOSIS — I959 Hypotension, unspecified: Secondary | ICD-10-CM | POA: Diagnosis not present

## 2022-02-24 DIAGNOSIS — R110 Nausea: Secondary | ICD-10-CM | POA: Diagnosis not present

## 2022-02-24 LAB — URINALYSIS, ROUTINE W REFLEX MICROSCOPIC
Bilirubin Urine: NEGATIVE
Glucose, UA: NEGATIVE mg/dL
Hgb urine dipstick: NEGATIVE
Ketones, ur: 20 mg/dL — AB
Leukocytes,Ua: NEGATIVE
Nitrite: NEGATIVE
Protein, ur: NEGATIVE mg/dL
Specific Gravity, Urine: 1.011 (ref 1.005–1.030)
pH: 6 (ref 5.0–8.0)

## 2022-02-24 LAB — CBC WITH DIFFERENTIAL/PLATELET
Abs Immature Granulocytes: 0.06 10*3/uL (ref 0.00–0.07)
Basophils Absolute: 0 10*3/uL (ref 0.0–0.1)
Basophils Relative: 1 %
Eosinophils Absolute: 0 10*3/uL (ref 0.0–0.5)
Eosinophils Relative: 0 %
HCT: 43.3 % (ref 36.0–46.0)
Hemoglobin: 14.2 g/dL (ref 12.0–15.0)
Immature Granulocytes: 1 %
Lymphocytes Relative: 13 %
Lymphs Abs: 1 10*3/uL (ref 0.7–4.0)
MCH: 31.8 pg (ref 26.0–34.0)
MCHC: 32.8 g/dL (ref 30.0–36.0)
MCV: 97.1 fL (ref 80.0–100.0)
Monocytes Absolute: 0.6 10*3/uL (ref 0.1–1.0)
Monocytes Relative: 8 %
Neutro Abs: 6 10*3/uL (ref 1.7–7.7)
Neutrophils Relative %: 77 %
Platelets: 142 10*3/uL — ABNORMAL LOW (ref 150–400)
RBC: 4.46 MIL/uL (ref 3.87–5.11)
RDW: 12.9 % (ref 11.5–15.5)
WBC: 7.7 10*3/uL (ref 4.0–10.5)
nRBC: 0 % (ref 0.0–0.2)

## 2022-02-24 LAB — COMPREHENSIVE METABOLIC PANEL
ALT: 19 U/L (ref 0–44)
AST: 24 U/L (ref 15–41)
Albumin: 3.9 g/dL (ref 3.5–5.0)
Alkaline Phosphatase: 78 U/L (ref 38–126)
Anion gap: 13 (ref 5–15)
BUN: 11 mg/dL (ref 6–20)
CO2: 23 mmol/L (ref 22–32)
Calcium: 9.1 mg/dL (ref 8.9–10.3)
Chloride: 105 mmol/L (ref 98–111)
Creatinine, Ser: 0.8 mg/dL (ref 0.44–1.00)
GFR, Estimated: >60 mL/min (ref >60)
Glucose, Bld: 110 mg/dL — ABNORMAL HIGH (ref 70–99)
Potassium: 3.7 mmol/L (ref 3.5–5.1)
Sodium: 141 mmol/L (ref 135–145)
Total Bilirubin: 1 mg/dL (ref 0.3–1.2)
Total Protein: 6.8 g/dL (ref 6.5–8.1)

## 2022-02-24 LAB — I-STAT VENOUS BLOOD GAS, ED
Acid-Base Excess: 0 mmol/L (ref 0.0–2.0)
Bicarbonate: 24.6 mmol/L (ref 20.0–28.0)
Calcium, Ion: 1.1 mmol/L — ABNORMAL LOW (ref 1.15–1.40)
HCT: 43 % (ref 36.0–46.0)
Hemoglobin: 14.6 g/dL (ref 12.0–15.0)
O2 Saturation: 74 %
Potassium: 3.6 mmol/L (ref 3.5–5.1)
Sodium: 141 mmol/L (ref 135–145)
TCO2: 26 mmol/L (ref 22–32)
pCO2, Ven: 37.9 mmHg — ABNORMAL LOW (ref 44–60)
pH, Ven: 7.42 (ref 7.25–7.43)
pO2, Ven: 39 mmHg (ref 32–45)

## 2022-02-24 LAB — RESP PANEL BY RT-PCR (FLU A&B, COVID) ARPGX2
Influenza A by PCR: NEGATIVE
Influenza B by PCR: NEGATIVE
SARS Coronavirus 2 by RT PCR: NEGATIVE

## 2022-02-24 LAB — ACETAMINOPHEN LEVEL: Acetaminophen (Tylenol), Serum: <10 ug/mL — ABNORMAL LOW (ref 10–30)

## 2022-02-24 LAB — RAPID URINE DRUG SCREEN, HOSP PERFORMED
Amphetamines: NOT DETECTED
Barbiturates: NOT DETECTED
Benzodiazepines: NOT DETECTED
Cocaine: NOT DETECTED
Opiates: NOT DETECTED
Tetrahydrocannabinol: NOT DETECTED

## 2022-02-24 LAB — MAGNESIUM: Magnesium: 1.9 mg/dL (ref 1.7–2.4)

## 2022-02-24 LAB — LIPASE, BLOOD: Lipase: 29 U/L (ref 11–51)

## 2022-02-24 LAB — SALICYLATE LEVEL: Salicylate Lvl: <7 mg/dL — ABNORMAL LOW (ref 7.0–30.0)

## 2022-02-24 MED ORDER — SODIUM CHLORIDE 0.9 % IV BOLUS
1000.0000 mL | Freq: Once | INTRAVENOUS | Status: AC
  Administered 2022-02-24: 1000 mL via INTRAVENOUS

## 2022-02-24 NOTE — ED Provider Notes (Signed)
[facility] EMERGENCY DEPARTMENT Provider Note   CSN: [id number] Arrival date & time: 02/24/22  4008     History  Chief Complaint  Patient presents with   Suicide Attempt   Drug Overdose    Christine Bishop is a 56 y.o. female.  Patient as above with significant medical history as below, including anxiety, migraine headaches who presents to the ED with complaint of trazodone overdose.  Patient reports around 11 AM she attempted to kill herself by ingesting 50 x 50 mg tablets of trazodone.  Denies any coingestions.  Denies alcohol use.  Denies ingestion of any other medications at that time.  After ingesting these medications she began to feel remorseful regarding her decision to try to harm herself and came to the ER for evaluation of the ingestion.  Reports feeling mildly sleepy compared to normal but otherwise has no acute complaints.  No nausea or vomiting.  No difficulty breathing or chest pain.  No change in bowel or bladder function.  No gait disturbance.  No numbness or tingling seems new.  No headaches.  Feels she was in her normal state of health medically prior to ingestion.  Denies history of prior ingestion in the past.  Reports she is no longer suicidal, no HI      Past Medical History:  Diagnosis Date   Anxiety    Dysplastic nevus 12/24/2019   R upper back paraspinal - moderate   Dysplastic nevus 12/24/2019   R to mid upper back 3.0 cm lat to spine above braline - mild   History of migraine headaches    Posterior vitreous detachment of left eye 12/14/2019   Vitreous membranes and strands, left 04/20/2020   Vitrectomy left eye 04-27-20    Past Surgical History:  Procedure Laterality Date   CHOLECYSTECTOMY OPEN     LASIK     LEEP        The history is provided by the patient. No language interpreter was used.  Drug Overdose Pertinent negatives include no chest pain, no abdominal pain, no headaches and no shortness of breath.       Home  Medications Prior to Admission medications   Medication Sig Start Date End Date Taking? Authorizing Provider  aspirin-acetaminophen-caffeine (EXCEDRIN MIGRAINE) [phone] MG tablet Take 2 tablets by mouth every 6 (six) hours as needed for headache or migraine.    [provider]  b complex vitamins tablet Take 1 tablet by mouth daily.    [provider]  busPIRone (BUSPAR) 5 MG tablet Take 5 mg by mouth 2 (two) times daily. 09/19/21   [provider]  cyclobenzaprine (FLEXERIL) 10 MG tablet TAKE 1 TABLET BY MOUTH AT BEDTIME AS NEEDED FOR MUSCLE SPASMS 12/07/21   Ronnell Freshwater, NP  diazepam (VALIUM) 10 MG tablet Take 10 mg by mouth daily as needed for anxiety. 09/11/21   [provider]  diclofenac (VOLTAREN) 50 MG EC tablet Take 50 mg by mouth 3 (three) times daily. 09/19/21   [provider]  Diclofenac Potassium,Migraine, 50 MG PACK Take 50 mg by mouth 2 (two) times daily as needed (Mix in 1/4 cup of water and take at onset of headache. May repeat in 2 hours if needed).    [provider]  doxycycline (VIBRA-TABS) 100 MG tablet Take 1 tablet twice a day with food 01/18/22   Ralene Bathe, MD  escitalopram (LEXAPRO) 20 MG tablet Take 1 tablet (20 mg total) by mouth daily. Patient not  taking: Reported on 10/12/2021 06/19/21   Ronnell Freshwater, NP  fluticasone (FLONASE) 50 MCG/ACT nasal spray Place 2 sprays into both nostrils daily as needed for allergies.    [provider]  ketorolac (ACULAR) 0.5 % ophthalmic solution INSTILL 1 DROP INTO THE RIGHT EYE 4 TIMES A DAY AS DIRECTED Patient taking differently: Place 1 drop into the right eye in the morning and at bedtime. 06/26/21   Rankin, Clent Demark, MD  ketorolac (TORADOL) 10 MG tablet Take 1 tablet (10 mg total) by mouth every 6 (six) hours as needed. 10/12/21   Ronnell Freshwater, NP  magnesium gluconate (MAGONATE) 500 MG tablet Take 500 mg by mouth at bedtime.    [provider]   METRONIDAZOLE, TOPICAL, 0.75 % LOTN Apply a thin coat to the entire face QHS Patient taking differently: Apply 1 application. topically at bedtime. Apply a thin coat to the entire face 01/09/21   Ralene Bathe, MD  montelukast (SINGULAIR) 10 MG tablet Take 10 mg by mouth at bedtime as needed (For allergies). 10/03/20   [provider]  Multiple Vitamins-Minerals (PRESERVISION AREDS PO) Take 1 tablet by mouth in the morning and at bedtime.    [provider]  OLANZapine (ZYPREXA) 10 MG tablet Take by mouth. 09/29/21   [provider]  ondansetron (ZOFRAN-ODT) 8 MG disintegrating tablet Take 8 mg by mouth every 8 (eight) hours as needed for nausea or vomiting. 11/17/19   [provider]  oxyCODONE-acetaminophen (PERCOCET/ROXICET) 5-325 MG tablet Take 1 tablet by mouth every 4 (four) hours as needed (For pain). 09/19/21   [provider]  predniSONE (STERAPRED UNI-PAK 48 TAB) 10 MG (48) TBPK tablet 12 day taper - take by mouth as directed for 12 days 10/12/21   Ronnell Freshwater, NP  Propylene Glycol (SYSTANE COMPLETE) 0.6 % SOLN Place 2 drops into both eyes daily as needed (For dry eyes).    [provider]  psyllium (METAMUCIL) 58.6 % powder Take 1 packet by mouth daily.    [provider]  QULIPTA 60 MG TABS Take 60 mg by mouth at bedtime. 09/04/21   [provider]  rosuvastatin (CRESTOR) 5 MG tablet Take 5 mg by mouth at bedtime. 03/30/21   [provider]  SUMAtriptan 6 MG/0.5ML SOAJ Inject 6 mg into the skin every 2 (two) hours as needed (Inject at onset of headache, may repeat in 2 hours if needed but no more than 2 in 24 hours). 02/01/15   [provider]  tiZANidine (ZANAFLEX) 4 MG tablet Take 2-4 mg by mouth at bedtime as needed for muscle spasms. 06/12/21   [provider]  traZODone (DESYREL) 50 MG tablet TAKE 1/2 TO 1 TABLET BY MOUTH AT BEDTIME AS NEEDED FOR SLEEP 10/23/21   Ronnell Freshwater, NP       Allergies    Propranolol, Zonegran [zonisamide], and Topamax [topiramate]    Review of Systems   Review of Systems  Constitutional:  Positive for fatigue. Negative for activity change and fever.  HENT:  Negative for facial swelling and trouble swallowing.   Eyes:  Negative for discharge and redness.  Respiratory:  Negative for cough and shortness of breath.   Cardiovascular:  Negative for chest pain and palpitations.  Gastrointestinal:  Negative for abdominal pain and nausea.  Genitourinary:  Negative for dysuria and flank pain.  Musculoskeletal:  Negative for back pain and gait problem.  Skin:  Negative for pallor and rash.  Neurological:  Negative for syncope and headaches.  Psychiatric/Behavioral:  Positive for suicidal ideas. The patient is nervous/anxious.     Physical Exam Updated Vital Signs BP 116/63   Pulse 85   Temp 98.5 F (36.9 C) (Oral)   Resp 19   SpO2 99%  Physical Exam Vitals and nursing note reviewed.  Constitutional:      General: She is not in acute distress.    Appearance: Normal appearance.  HENT:     Head: Normocephalic and atraumatic.     Right Ear: External ear normal.     Left Ear: External ear normal.     Nose: Nose normal.     Mouth/Throat:     Mouth: Mucous membranes are moist.  Eyes:     General: No scleral icterus.       Right eye: No discharge.        Left eye: No discharge.  Cardiovascular:     Rate and Rhythm: Normal rate and regular rhythm.     Pulses: Normal pulses.     Heart sounds: Normal heart sounds.  Pulmonary:     Effort: Pulmonary effort is normal. No respiratory distress.     Breath sounds: Normal breath sounds.  Abdominal:     General: Abdomen is flat.     Tenderness: There is no abdominal tenderness.  Musculoskeletal:        General: Normal range of motion.     Cervical back: Normal range of motion.     Right lower leg: No edema.     Left lower leg: No edema.  Skin:    General: Skin is warm and dry.      Capillary Refill: Capillary refill takes less than 2 seconds.  Neurological:     Mental Status: She is alert and oriented to person, place, and time.     GCS: GCS eye subscore is 4. GCS verbal subscore is 5. GCS motor subscore is 6.     Cranial Nerves: Cranial nerves 2-12 are intact.     Sensory: Sensation is intact.     Motor: Motor function is intact.     Coordination: Coordination is intact.     Gait: Gait is intact.     Comments: Strength 5/5 BLUE BLLE symmetric   Psychiatric:        Attention and Perception: Attention normal.        Mood and Affect: Mood normal.        Behavior: Behavior is slowed. Behavior is cooperative.        Thought Content: Thought content does not include homicidal or suicidal ideation. Thought content does not include homicidal or suicidal plan.     ED Results / Procedures / Treatments   Labs (all labs ordered are listed, but only abnormal results are displayed) Labs Reviewed  CBC WITH DIFFERENTIAL/PLATELET - Abnormal; Notable for the following components:      Result Value   Platelets 142 (*)    All other components within normal limits  COMPREHENSIVE METABOLIC PANEL - Abnormal; Notable for the following components:   Glucose, Bld 110 (*)    All other components within normal limits  ACETAMINOPHEN LEVEL - Abnormal; Notable for the following components:   Acetaminophen (Tylenol), Serum <10 (*)    All other components within normal limits  SALICYLATE LEVEL - Abnormal; Notable for the following components:   Salicylate Lvl <5.5 (*)    All other components within normal limits  URINALYSIS, ROUTINE W REFLEX MICROSCOPIC - Abnormal; Notable for  the following components:   Ketones, ur 20 (*)    All other components within normal limits  I-STAT VENOUS BLOOD GAS, ED - Abnormal; Notable for the following components:   pCO2, Ven 37.9 (*)    Calcium, Ion 1.10 (*)    All other components within normal limits  RESP PANEL BY RT-PCR (FLU A&B, COVID) ARPGX2   LIPASE, BLOOD  RAPID URINE DRUG SCREEN, HOSP PERFORMED  MAGNESIUM  ETHANOL    EKG EKG Interpretation  Date/Time:  Saturday February 24 2022 23:00:42 EDT Ventricular Rate:  83 PR Interval:  163 QRS Duration: 88 QT Interval:  423 QTC Calculation: 498 R Axis:   65 Text Interpretation: Sinus rhythm Borderline prolonged QT interval Confirmed by Wynona Dove ([id number]) on 02/24/2022 11:06:33 PM  Radiology DG Chest Portable 1 View  Result Date: 02/24/2022 CLINICAL DATA:  Increased drowsiness EXAM: PORTABLE CHEST 1 VIEW COMPARISON:  09/18/2021 FINDINGS: The heart size and mediastinal contours are within normal limits. Both lungs are clear. The visualized skeletal structures are unremarkable. IMPRESSION: No active disease. Electronically Signed   By: Elmer Picker M.D.   On: 02/24/2022 19:43    Procedures Procedures    Medications Ordered in ED Medications  sodium chloride 0.9 % bolus 1,000 mL (0 mLs Intravenous Stopped 02/24/22 2024)    ED Course/ Medical Decision Making/ A&P                           Medical Decision Making Amount and/or Complexity of Data Reviewed Labs: ordered. Radiology: ordered.   This patient presents to the ED with chief complaint(s) of suicide attempt with pertinent past medical history of anxiety/bipolar which further complicates the presenting complaint. The complaint involves an extensive differential diagnosis and also carries with it a high risk of complications and morbidity.    The differential diagnosis includes but not limited to ingestion, coingestion, electrolyte derangement, metabolic derangement, infectious etiology. Serious etiologies were considered.   The initial plan is to place patient under IVC, screening labs and imaging, will discuss supportive control.  Continue on cardiac monitor.  ECG   Additional history obtained: Additional history obtained from  na Records reviewed Primary Care Documents and home meds  Independent labs  interpretation:  The following labs were independently interpreted:  Salicylate, acetaminophen were negative CMP with stable electrolytes, CBC with mild thrombocytopenia 142.  Patient denies increased bruising or bleeding of any source VBG with stable pH UA and UDS w/o infection; UDS neg.  Magnesium and lipase are within normal limits Ketones noted on UA, give ivf, patient able to tolerate p.o. intake without difficulty  Independent visualization of imaging: - I independently visualized the following imaging with scope of interpretation limited to determining acute life threatening conditions related to emergency care: Chest x-ray, which revealed no acute process  Cardiac monitoring was reviewed and interpreted by myself which shows NSR.  EKG unremarkable  Treatment and Reassessment: IV fluids > symptoms unchanged   Consultation: - Consulted or discussed management/test interpretation w/ external professional: Poison control, recommend observation 4-5 hours after arrival, monitor electrolytes, fluids or pressors if needed for hypotension.  Consideration for admission or further workup: Admission was considered   Patient is pending medical clearance per poison control recommendations.  Labs are stable, vital signs stable.  Physical exam is reassuring.  No neurologic deficits.  Mental status is appropriate.  GCS 15, AO x4.  Patient placed under IVC for suicide attempt.  Will consult TTS.  >>  EKG repeated per recommendations from poison control.  Patient medically cleared at this time.  Pending TTS evaluation and eventual disposition.  She is hemodynamically stable.    Social Determinants of health: Social History   Tobacco Use   Smoking status: Former    Types: Cigarettes    Quit date: 06/07/2011    Years since quitting: 10.7   Smokeless tobacco: Never  Substance Use Topics   Alcohol use: No   Drug use: No            Final Clinical Impression(s) / ED Diagnoses Final  diagnoses:  Suicide attempt [facility])    Rx / [place] Orders ED Discharge Orders     None         Jeanell Sparrow, DO 02/24/22 2317

## 2022-02-24 NOTE — ED Notes (Signed)
Received verbal report from Bailey B RN at this time 

## 2022-02-24 NOTE — ED Notes (Signed)
Pt placed in hospital clothing and belongings taken. PT remains hooked up to monitoring per poison controls recommendations

## 2022-02-24 NOTE — ED Notes (Signed)
Staffing called, no sitters available tonight

## 2022-02-24 NOTE — ED Notes (Signed)
4-5 hours obs, wanting K and mag to be on upper end of normal, repeat EKG at discharge and treat BP if neede with fluids and norepi per poison control

## 2022-02-24 NOTE — ED Triage Notes (Signed)
Pt BIB EMS from home for increased drowsiness after taking 50 '50mg'$  trazodone pills as a SI attempt at 1100, N/V/D earlier , Now is drowsy.   112/58 102 CBG  96%  110 HR

## 2022-02-24 NOTE — ED Notes (Signed)
Pt wanded by security. 

## 2022-02-24 NOTE — ED Notes (Signed)
Poison control undated and no changes to recommendations

## 2022-02-24 NOTE — ED Notes (Signed)
IVC paperwork is completed and placed in blue zone

## 2022-02-25 DIAGNOSIS — F322 Major depressive disorder, single episode, severe without psychotic features: Secondary | ICD-10-CM

## 2022-02-25 DIAGNOSIS — F333 Major depressive disorder, recurrent, severe with psychotic symptoms: Secondary | ICD-10-CM

## 2022-02-25 DIAGNOSIS — F332 Major depressive disorder, recurrent severe without psychotic features: Secondary | ICD-10-CM

## 2022-02-25 DIAGNOSIS — T438X2A Poisoning by other psychotropic drugs, intentional self-harm, initial encounter: Secondary | ICD-10-CM

## 2022-02-25 LAB — ETHANOL: Alcohol, Ethyl (B): <10 mg/dL (ref <10)

## 2022-02-25 NOTE — ED Notes (Signed)
IVC paperwork in purple

## 2022-02-25 NOTE — ED Notes (Signed)
IVC paperwork in in blue zone purple folder

## 2022-02-25 NOTE — ED Notes (Signed)
Pt moved to H22 at this time

## 2022-02-25 NOTE — Consult Note (Signed)
[facility] ED ASSESSMENT   Reason for Consult:  Suicide attempt/Psychiatric Consult  Referring Physician:  Dr. Wynona Dove  Patient Identification: Christine Bishop MRN:  [id number] ED Chief Complaint: Severe recurrent major depression with psychotic features [facility])  Diagnosis:  Principal Problem:   Severe recurrent major depression with psychotic features ([place]) Active Problems:   Suicide attempt by other psychotropic drug overdose [facility])   ED Assessment Time Calculation: Start Time: 1300 Stop Time: 1330 Total Time in Minutes (Assessment Completion): 33   Christine Bishop, 56 y.o., female patient seen face to face by this provider, consulted with Dr.Kumar and chart reviewed on 02/25/22.    Subjective:   [name] is a 56 y.o. female patient admitted to [facility] 02/24/2022 under IVC petition after attempting suicide by intentionally overdosing by taking approximately 50 tablets of  trazodone.   HPI:  Christine Bishop 56 y.o., female with history 1 disorder, generalized anxiety, previous psychosis, prior suicide attempt, recently discharged from [place] inpatient facility after a 4 days inpatient admission on 02/12/2022 for acute psychosis and paranoia.  According to discharge summary from [place], per Lowella Dandy, MD psychiatrist from [place] patient was discharged home on Abilify 5 mg daily, BuSpar 5 mg twice daily and trazodone for sleep.  On evaluation today patient endorses that her depression and overall mental health symptoms has steadily declined since she was placed on antipsychotics at some point back in April of May 2023.  Patient reports that she is followed by a mental health provider through [place] however could not recall the name of her psychiatrist.  She reports she is supposed to go to therapy but has never began or followed up on the treatment.  Patient endorses multiple prior suicide attempts and a long history of depression.  She continues to  endorse suicidal ideations however is unable to provide a specific reason as to why she would like to do that.  Patient is currently under IVC after her attempted overdose.  Patient has not exhibited any untoward symptoms related to her attempted overdose.  Patient resides at home with her adult daughter and husband.  Patient is agreeable to inpatient admission and  endorses that she needs ongoing psychiatric treatment to stabilize her mood.  Patient denies any illicit substance use or alcohol use.  Collateral Information: Spoke with patient's spouse Christine Bishop (husband), who reports that while he was at work patient attempted overdose with trazodone.  He reports that patient mental health has been on the decline for the last year and she has hospitalized at least twice for paranoia, delusional and manic behaviors.  He reports that patient has been under the treatment of her neurologist for several years for chronic migraine headaches and has been taking medications which were thought to mask symptoms of bipolar.  Christine Bishop reports that once patient was weaned off of her migraine medications a little over a year ago subsequent behaviors paranoia (patient had a delusion that their home had a hidden camera) and other "wild and bizarre things she was doing" per  [employer]. She was hospitalized initially the earlier part of this year for approximately 10 or 14 days per Christine Bishop and following that discharge patient seen somewhat stable however was overly sedated due to she was prescribed.  She is followed by psychiatry Dr.Masodkar with [facility] however recently administered her via telepsych, Per Christine Bishop patient is never completely honest with her healthcare providers when she is not functioning or feeling well and does  not want to change medications.  During evaluation Brynleigh Sequeira Castrellon is lying in bed with HOB elevated. She is without acute distress.  She is alert, oriented x 4, calm, cooperative and attentive.   Her mood is depressed, hopeless, dysphoric, with a congruent affect.  She has has normal low volume, slowed, speech, with cooperative behavior. Objectively patient appears solemn, very depressed, and matter-of-factly states that she wants to kill herself and attempted to kill herself by overdosing on trazodone. Patient is easily distracted.  Patient is unable to contract for safety and meets inpatient criteria.     Past Psychiatric History:  Diagnosed with Bipolar 1 2023 Three inpatient psychiatric admissions 2023 Recent psychotropic medications:  Abilify  5 mg daily Trazodone 50 mg  Buspar 5 mg twice daily Zyprexa 5  mg discontinued following recently inpatient admission (unknown how long prescribed-see [facility] everywhere  Risk to Self or Others: Is the patient at risk to self? Yes Has the patient been a risk to self in the past 6 months? Yes Has the patient been a risk to self within the distant past? Yes Is the patient a risk to others? No Has the patient been a risk to others in the past 6 months? No Has the patient been a risk to others within the distant past? No  [place] Scale:  [place] ED from 02/24/2022 in [place] ED from 09/19/2021 in [place] High Risk No Risk       Past Medical History:  Past Medical History:  Diagnosis Date   Anxiety    Dysplastic nevus 12/24/2019   R upper back paraspinal - moderate   Dysplastic nevus 12/24/2019   R to mid upper back 3.0 cm lat to spine above braline - mild   History of migraine headaches    Posterior vitreous detachment of left eye 12/14/2019   Vitreous membranes and strands, left 04/20/2020   Vitrectomy left eye 04-27-20    Past Surgical History:  Procedure Laterality Date   CHOLECYSTECTOMY OPEN     LASIK     LEEP     Family History:  Family History  Problem Relation Age of Onset   Cancer Mother    Hypertension Father     Hyperlipidemia Father    Cancer Maternal Grandmother     Social History:  Social History   Substance and Sexual Activity  Alcohol Use No     Social History   Substance and Sexual Activity  Drug Use No    Social History   Socioeconomic History   Marital status: Married    Spouse name: Not on file   Number of children: Not on file   Years of education: Not on file   Highest education level: Not on file  Occupational History   Not on file  Tobacco Use   Smoking status: Former    Types: Cigarettes    Quit date: 06/07/2011    Years since quitting: 10.7   Smokeless tobacco: Never  Substance and Sexual Activity   Alcohol use: No   Drug use: No   Sexual activity: Yes  Other Topics Concern   Not on file  Social History Narrative   Not on file   Social Determinants of Health   Financial Resource Strain: Not on file  Food Insecurity: Not on file  Transportation Needs: Not on file  Physical Activity: Not on file  Stress: Not on file  Social  Connections: Not on file   Additional Social History:    Allergies:   Allergies  Allergen Reactions   Inderal [Propranolol] Other (See Comments)    Dizziness Syncopal episodes "makes me manic"   Septra [Sulfamethoxazole-Trimethoprim] Nausea And Vomiting   Zonegran [Zonisamide] Other (See Comments)    Bipolar/ Mania symptoms    Topamax [Topiramate] Anxiety, Palpitations and Other (See Comments)    Panic attacks Bi-Polar symptoms    Labs:  Results for orders placed or performed during the hospital encounter of 02/24/22 (from the past 48 hour(s))  Rapid urine drug screen (hospital performed)     Status: None   Collection Time: 02/24/22  6:38 PM  Result Value Ref Range   Opiates NONE DETECTED NONE DETECTED   Cocaine NONE DETECTED NONE DETECTED   Benzodiazepines NONE DETECTED NONE DETECTED   Amphetamines NONE DETECTED NONE DETECTED   Tetrahydrocannabinol NONE DETECTED NONE DETECTED   Barbiturates NONE DETECTED NONE  DETECTED    Comment: (NOTE) DRUG SCREEN FOR MEDICAL PURPOSES ONLY.  IF CONFIRMATION IS NEEDED FOR ANY PURPOSE, NOTIFY LAB WITHIN 5 DAYS.  LOWEST DETECTABLE LIMITS FOR URINE DRUG SCREEN Drug Class                     Cutoff (ng/mL) Amphetamine and metabolites    1000 Barbiturate and metabolites    200 Benzodiazepine                 062 Tricyclics and metabolites     300 Opiates and metabolites        300 Cocaine and metabolites        300 THC                            50 Performed at [facility], [place] [address], [place], [place] [address]   Urinalysis, Routine w reflex microscopic     Status: Abnormal   Collection Time: 02/24/22  6:38 PM  Result Value Ref Range   Color, Urine YELLOW YELLOW   APPearance CLEAR CLEAR   Specific Gravity, Urine 1.011 1.005 - 1.030   pH 6.0 5.0 - 8.0   Glucose, UA NEGATIVE NEGATIVE mg/dL   Hgb urine dipstick NEGATIVE NEGATIVE   Bilirubin Urine NEGATIVE NEGATIVE   Ketones, ur 20 (A) NEGATIVE mg/dL   Protein, ur NEGATIVE NEGATIVE mg/dL   Nitrite NEGATIVE NEGATIVE   Leukocytes,Ua NEGATIVE NEGATIVE    Comment: Performed at [facility] [address], [place], [place] [address]  I-Stat venous blood gas, [facility] ED only)     Status: Abnormal   Collection Time: 02/24/22  7:18 PM  Result Value Ref Range   pH, Ven 7.420 7.25 - 7.43   pCO2, Ven 37.9 (L) 44 - 60 mmHg   pO2, Ven 39 32 - 45 mmHg   Bicarbonate 24.6 20.0 - 28.0 mmol/L   TCO2 26 22 - 32 mmol/L   O2 Saturation 74 %   Acid-Base Excess 0.0 0.0 - 2.0 mmol/L   Sodium 141 135 - 145 mmol/L   Potassium 3.6 3.5 - 5.1 mmol/L   Calcium, Ion 1.10 (L) 1.15 - 1.40 mmol/L   HCT 43.0 36.0 - 46.0 %   Hemoglobin 14.6 12.0 - 15.0 g/dL   Collection site Brachial    Drawn by HIDE    Sample type VENOUS    Comment NOTIFIED PHYSICIAN   CBC with Differential     Status: Abnormal  Collection Time: 02/24/22  7:35 PM  Result Value Ref Range   WBC 7.7 4.0 - 10.5 K/uL   RBC 4.46 3.87 - 5.11 MIL/uL    Hemoglobin 14.2 12.0 - 15.0 g/dL   HCT 43.3 36.0 - 46.0 %   MCV 97.1 80.0 - 100.0 fL   MCH 31.8 26.0 - 34.0 pg   MCHC 32.8 30.0 - 36.0 g/dL   RDW 12.9 11.5 - 15.5 %   Platelets 142 (L) 150 - 400 K/uL   nRBC 0.0 0.0 - 0.2 %   Neutrophils Relative % 77 %   Neutro Abs 6.0 1.7 - 7.7 K/uL   Lymphocytes Relative 13 %   Lymphs Abs 1.0 0.7 - 4.0 K/uL   Monocytes Relative 8 %   Monocytes Absolute 0.6 0.1 - 1.0 K/uL   Eosinophils Relative 0 %   Eosinophils Absolute 0.0 0.0 - 0.5 K/uL   Basophils Relative 1 %   Basophils Absolute 0.0 0.0 - 0.1 K/uL   Immature Granulocytes 1 %   Abs Immature Granulocytes 0.06 0.00 - 0.07 K/uL    Comment: Performed at [facility] [address], [place], [place] [address]  Comprehensive metabolic panel     Status: Abnormal   Collection Time: 02/24/22  7:35 PM  Result Value Ref Range   Sodium 141 135 - 145 mmol/L   Potassium 3.7 3.5 - 5.1 mmol/L   Chloride 105 98 - 111 mmol/L   CO2 23 22 - 32 mmol/L   Glucose, Bld 110 (H) 70 - 99 mg/dL    Comment: Glucose reference range applies only to samples taken after fasting for at least 8 hours.   BUN 11 6 - 20 mg/dL   Creatinine, Ser 0.80 0.44 - 1.00 mg/dL   Calcium 9.1 8.9 - 10.3 mg/dL   Total Protein 6.8 6.5 - 8.1 g/dL   Albumin 3.9 3.5 - 5.0 g/dL   AST 24 15 - 41 U/L   ALT 19 0 - 44 U/L   Alkaline Phosphatase 78 38 - 126 U/L   Total Bilirubin 1.0 0.3 - 1.2 mg/dL   GFR, Estimated >60 >60 mL/min    Comment: (NOTE) Calculated using the CKD-EPI Creatinine Equation (2021)    Anion gap 13 5 - 15    Comment: Performed at [facility] [address], [place], [place] [address]  Lipase, blood     Status: None   Collection Time: 02/24/22  7:35 PM  Result Value Ref Range   Lipase 29 11 - 51 U/L    Comment: Performed at [facility] [address], [place], [place] [address]  Acetaminophen level     Status: Abnormal   Collection Time: 02/24/22  7:35 PM  Result Value Ref Range    Acetaminophen (Tylenol), Serum <10 (L) 10 - 30 ug/mL    Comment: (NOTE) Therapeutic concentrations vary significantly. A range of 10-30 ug/mL  may be an effective concentration for many patients. However, some  are best treated at concentrations outside of this range. Acetaminophen concentrations >150 ug/mL at 4 hours after ingestion  and >50 ug/mL at 12 hours after ingestion are often associated with  toxic reactions.  Performed at [facility], [place] [address], [place], [place] [address]   Salicylate level     Status: Abnormal   Collection Time: 02/24/22  7:35 PM  Result Value Ref Range   Salicylate Lvl <3.7 (L) 7.0 - 30.0 mg/dL    Comment: Performed  at [facility], [place] [address], [place], [place] [address]  Magnesium     Status: None   Collection Time: 02/24/22  7:35 PM  Result Value Ref Range   Magnesium 1.9 1.7 - 2.4 mg/dL    Comment: Performed at [facility], [place] [address] [place], [place] [address]  Ethanol     Status: None   Collection Time: 02/24/22 11:00 PM  Result Value Ref Range   Alcohol, Ethyl (B) <10 <10 mg/dL    Comment: (NOTE) Lowest detectable limit for serum alcohol is 10 mg/dL.  For medical purposes only. Performed at [facility], [place] [address] [place], [place] [address]   Resp Panel by RT-PCR (Flu A&B, Covid) Anterior Nasal Swab     Status: None   Collection Time: 02/24/22 11:02 PM   Specimen: Anterior Nasal Swab  Result Value Ref Range   SARS Coronavirus 2 by RT PCR NEGATIVE NEGATIVE    Comment: (NOTE) SARS-CoV-2 target nucleic acids are NOT DETECTED.  The SARS-CoV-2 RNA is generally detectable in upper respiratory specimens during the acute phase of infection. The lowest concentration of SARS-CoV-2 viral copies this assay can detect is 138 copies/mL. A negative result does not preclude SARS-Cov-2 infection and should not be used as the sole basis for treatment or other patient management decisions. A negative result  may occur with  improper specimen collection/handling, submission of specimen other than nasopharyngeal swab, presence of viral mutation(s) within the areas targeted by this assay, and inadequate number of viral copies(<138 copies/mL). A negative result must be combined with clinical observations, patient history, and epidemiological information. The expected result is Negative.  Fact Sheet for Patients:  EntrepreneurPulse.com.au  Fact Sheet for Healthcare Providers:  IncredibleEmployment.be  This test is no t yet approved or cleared by the [place] FDA and  has been authorized for detection and/or diagnosis of SARS-CoV-2 by FDA under an Emergency Use Authorization (EUA). This EUA will remain  in effect (meaning this test can be used) for the duration of the COVID-19 declaration under Section 564(b)(1) of the Act, 21 U.S.C.section 360bbb-3(b)(1), unless the authorization is terminated  or revoked sooner.       Influenza A by PCR NEGATIVE NEGATIVE   Influenza B by PCR NEGATIVE NEGATIVE    Comment: (NOTE) The Xpert Xpress SARS-CoV-2/FLU/RSV plus assay is intended as an aid in the diagnosis of influenza from Nasopharyngeal swab specimens and should not be used as a sole basis for treatment. Nasal washings and aspirates are unacceptable for Xpert Xpress SARS-CoV-2/FLU/RSV testing.  Fact Sheet for Patients: EntrepreneurPulse.com.au  Fact Sheet for Healthcare Providers: IncredibleEmployment.be  This test is not yet approved or cleared by the [place] FDA and has been authorized for detection and/or diagnosis of SARS-CoV-2 by FDA under an Emergency Use Authorization (EUA). This EUA will remain in effect (meaning this test can be used) for the duration of the COVID-19 declaration under Section 564(b)(1) of the Act, 21 U.S.C. section 360bbb-3(b)(1), unless the authorization is terminated  or revoked.  Performed at [facility], [place] [address], [place], [place] [address]     No current facility-administered medications for this encounter.   Current Outpatient Medications  Medication Sig Dispense Refill   ARIPiprazole (ABILIFY) 5 MG tablet Take 5 mg by mouth daily.     busPIRone (BUSPAR) 5 MG tablet Take 5 mg by mouth 2 (two) times daily.     ketorolac (ACULAR) 0.5 % ophthalmic solution INSTILL 1  DROP INTO THE RIGHT EYE 4 TIMES A DAY AS DIRECTED (Patient taking differently: Place 1 drop into the right eye in the morning and at bedtime.) 5 mL 6   Multiple Vitamins-Minerals (PRESERVISION AREDS PO) Take 1 tablet by mouth in the morning and at bedtime.     Propylene Glycol (SYSTANE COMPLETE) 0.6 % SOLN Place 2 drops into both eyes daily as needed (For dry eyes).     tiZANidine (ZANAFLEX) 4 MG tablet Take 2-4 mg by mouth at bedtime as needed for muscle spasms.     traZODone (DESYREL) 50 MG tablet TAKE 1/2 TO 1 TABLET BY MOUTH AT BEDTIME AS NEEDED FOR SLEEP (Patient taking differently: Take 25-50 mg by mouth at bedtime as needed for sleep.) 90 tablet 0   Atogepant (QULIPTA) 60 MG TABS Take 60 mg by mouth daily at 6 (six) AM. (Patient not taking: Reported on 02/25/2022)     cyclobenzaprine (FLEXERIL) 10 MG tablet TAKE 1 TABLET BY MOUTH AT BEDTIME AS NEEDED FOR MUSCLE SPASMS (Patient not taking: Reported on 02/25/2022) 30 tablet 1   Diclofenac Potassium,Migraine, 50 MG PACK Take 50 mg by mouth 2 (two) times daily as needed (migraine). (Patient not taking: Reported on 02/25/2022)     doxycycline (VIBRA-TABS) 100 MG tablet Take 1 tablet twice a day with food (Patient not taking: Reported on 02/25/2022) 60 tablet 0   escitalopram (LEXAPRO) 20 MG tablet Take 1 tablet (20 mg total) by mouth daily. (Patient not taking: Reported on 10/12/2021) 30 tablet 3   METRONIDAZOLE, TOPICAL, 0.75 % LOTN Apply a thin coat to the entire face QHS (Patient not taking: Reported on 02/25/2022) 59 mL 11    OLANZapine (ZYPREXA) 5 MG tablet Take 5 mg by mouth at bedtime. (Patient not taking: Reported on 02/25/2022)     predniSONE (STERAPRED UNI-PAK 48 TAB) 10 MG (48) TBPK tablet 12 day taper - take by mouth as directed for 12 days (Patient not taking: Reported on 02/25/2022) 48 tablet 0   rosuvastatin (CRESTOR) 5 MG tablet Take 5 mg by mouth daily. (Patient not taking: Reported on 02/25/2022)     SUMAtriptan Succinate 6 MG/0.5ML SOCT Inject 0.6 mg into the skin See admin instructions. Inject 0.6 mg subcutaneously once daily as needed for migraine. May repeat dose in 2 hours if needed. No more than 2 doses in 24 hours. (Patient not taking: Reported on 02/25/2022)         Psychiatric Specialty Exam: Presentation  General Appearance:  Appropriate for Environment  Eye Contact: Fleeting  Speech: Slow; Clear and Coherent  Speech Volume: Decreased  Handedness: Right   Mood and Affect  Mood: Depressed; Dysphoric; Hopeless  Affect: Tearful; Depressed   Thought Process  Thought Processes: Goal Directed  Descriptions of Associations:Intact  Orientation:Full (Time, Place and Person)  Thought Content:WDL  History of Schizophrenia/Schizoaffective disorder:No data recorded Duration of Psychotic Symptoms:Less than six months  Hallucinations:Hallucinations: None  Ideas of Reference:None  Suicidal Thoughts:Suicidal Thoughts: Yes, Active SI Active Intent and/or Plan: With Intent; With Plan; With Means to [place]  Homicidal Thoughts:No data recorded  Sensorium  Memory: Immediate Fair; Recent Fair; Remote Fair  Judgment: Impaired  Insight: Poor   Executive Functions  Concentration: Fair  Attention Span: Fair  Recall: [employer] of Knowledge: Fair  Language: Fair   Psychomotor Activity  Psychomotor Activity: Psychomotor Activity: Normal   Assets  Assets: Armed forces logistics/support/administrative officer; Desire for Improvement; Social Support; Physical Health; Financial  Resources/Insurance; Housing; Intimacy; Resilience    Sleep  Sleep: Sleep: Poor Number of Hours of Sleep: 0 (Frequently awakening throughout the night and difficulty falling asleep)   Physical Exam: Physical Exam Constitutional:      General: She is not in acute distress.    Appearance: She is not toxic-appearing.  HENT:     Head: Normocephalic.  Eyes:     Extraocular Movements: Extraocular movements intact.     Pupils: Pupils are equal, round, and reactive to light.  Cardiovascular:     Rate and Rhythm: Normal rate.  Pulmonary:     Effort: Pulmonary effort is normal.     Breath sounds: Normal breath sounds.  Musculoskeletal:     Cervical back: Normal range of motion.  Neurological:     General: No focal deficit present.     Mental Status: She is alert.    Review of Systems  Psychiatric/Behavioral:  Positive for depression and suicidal ideas. The patient is nervous/anxious and has insomnia.    Blood pressure 122/67, pulse 96, temperature 98.3 F (36.8 C), temperature source Oral, resp. rate 15, height '5\' 6"'$  (1.676 m), weight 72.6 kg, SpO2 95 %. Body mass index is 25.82 kg/m.  Medical Decision Making: Patient case review and discussed with Dr. Dwyane Dee. Patient meets inpatient criteria for inpatient psychiatric treatment. Patient is unable to contract for safety at this time. There is currently no beds availability [facility] CSW notified and will be faxing patient out. EDP, RN, LCSW, notified of disposition.   Disposition: Recommend psychiatric Inpatient admission when medically cleared. Inpatient placement requested through [facility].  Molli Barrows, FNP, PMHNP-C 02/25/2022 5:03 PM

## 2022-02-26 ENCOUNTER — Inpatient Hospital Stay (HOSPITAL_COMMUNITY)
Admission: AD | Admit: 2022-02-26 | Discharge: 2022-03-04 | DRG: 881 | Disposition: A | Discharge status: Home / Self Care | Payer: Federal, State, Local not specified - PPO | Source: Intra-hospital | Attending: Psychiatry | Admitting: Psychiatry

## 2022-02-26 ENCOUNTER — Encounter (HOSPITAL_COMMUNITY): Payer: Self-pay | Admitting: Nurse Practitioner

## 2022-02-26 ENCOUNTER — Other Ambulatory Visit: Payer: Self-pay

## 2022-02-26 ENCOUNTER — Encounter (HOSPITAL_COMMUNITY): Payer: Self-pay

## 2022-02-26 DIAGNOSIS — Z818 Family history of other mental and behavioral disorders: Secondary | ICD-10-CM

## 2022-02-26 DIAGNOSIS — F411 Generalized anxiety disorder: Secondary | ICD-10-CM | POA: Diagnosis present

## 2022-02-26 DIAGNOSIS — Z87891 Personal history of nicotine dependence: Secondary | ICD-10-CM | POA: Diagnosis not present

## 2022-02-26 DIAGNOSIS — Z888 Allergy status to other drugs, medicaments and biological substances status: Secondary | ICD-10-CM | POA: Diagnosis not present

## 2022-02-26 DIAGNOSIS — E785 Hyperlipidemia, unspecified: Secondary | ICD-10-CM | POA: Diagnosis present

## 2022-02-26 DIAGNOSIS — Z9151 Personal history of suicidal behavior: Secondary | ICD-10-CM

## 2022-02-26 DIAGNOSIS — G43909 Migraine, unspecified, not intractable, without status migrainosus: Secondary | ICD-10-CM | POA: Diagnosis present

## 2022-02-26 DIAGNOSIS — F32A Depression, unspecified: Secondary | ICD-10-CM | POA: Diagnosis not present

## 2022-02-26 DIAGNOSIS — F329 Major depressive disorder, single episode, unspecified: Principal | ICD-10-CM | POA: Diagnosis present

## 2022-02-26 DIAGNOSIS — R41843 Psychomotor deficit: Secondary | ICD-10-CM | POA: Diagnosis present

## 2022-02-26 DIAGNOSIS — R45851 Suicidal ideations: Secondary | ICD-10-CM | POA: Diagnosis present

## 2022-02-26 DIAGNOSIS — D696 Thrombocytopenia, unspecified: Secondary | ICD-10-CM | POA: Diagnosis present

## 2022-02-26 DIAGNOSIS — F331 Major depressive disorder, recurrent, moderate: Secondary | ICD-10-CM | POA: Diagnosis not present

## 2022-02-26 DIAGNOSIS — Z20822 Contact with and (suspected) exposure to covid-19: Secondary | ICD-10-CM | POA: Diagnosis present

## 2022-02-26 DIAGNOSIS — G4700 Insomnia, unspecified: Secondary | ICD-10-CM | POA: Diagnosis present

## 2022-02-26 DIAGNOSIS — Z9049 Acquired absence of other specified parts of digestive tract: Secondary | ICD-10-CM | POA: Diagnosis not present

## 2022-02-26 DIAGNOSIS — Z79899 Other long term (current) drug therapy: Secondary | ICD-10-CM

## 2022-02-26 DIAGNOSIS — F3164 Bipolar disorder, current episode mixed, severe, with psychotic features: Secondary | ICD-10-CM | POA: Diagnosis present

## 2022-02-26 DIAGNOSIS — T438X2A Poisoning by other psychotropic drugs, intentional self-harm, initial encounter: Secondary | ICD-10-CM | POA: Diagnosis present

## 2022-02-26 DIAGNOSIS — F321 Major depressive disorder, single episode, moderate: Secondary | ICD-10-CM

## 2022-02-26 MED ORDER — HYDROXYZINE HCL 25 MG PO TABS: 25.0000 mg | ORAL_TABLET | Freq: Three times a day (TID) | ORAL | Status: DC | PRN

## 2022-02-26 MED ORDER — ALUM & MAG HYDROXIDE-SIMETH 200-200-20 MG/5ML PO SUSP: 30.0000 mL | ORAL | Status: DC | PRN

## 2022-02-26 MED ORDER — ACETAMINOPHEN 325 MG PO TABS: 650.0000 mg | ORAL_TABLET | Freq: Four times a day (QID) | ORAL | Status: DC | PRN

## 2022-02-26 NOTE — Progress Notes (Addendum)
Pt was accepted to [facility] 02/26/22; BED ASSIGNMENT 305-1  IVC paperwork was received at [phone] and reviewed by [facility], RN\  Pt meets inpatient criteria per Molli Barrows, FNP  Attending Physician will be Dr. Caswell Corwin  Report can be called to:Adult unit: [phone]  Pt can arrive after 2:00pm  Care Team notified: [facility], RN, Vesta Mixer, NP, Novella Olive, [place], RN, Sammuel Bailiff, RN, [place], Nellie 02/26/2022 @ 12:26 PM

## 2022-02-26 NOTE — ED Notes (Signed)
Called [facility] to tx patient to [facility] transport packet facesheet and ivc paperwork prepared awaiting emtala

## 2022-02-26 NOTE — Progress Notes (Signed)
Adult Psychoeducational Group Note  Date:  02/26/2022 Time:  9:07 PM  Group Topic/Focus:  AA Group  Participation Level:  Did Not Attend  Participation Quality:   n/a  Affect:   n/a  Cognitive:   n/a  Insight: None  Engagement in Group:   n/a  Modes of Intervention:   n/a  Additional Comments:   Pt did not attend the AA Group.  Wetzel Bjornstad Geramy Lamorte 02/26/2022, 9:07 PM

## 2022-02-26 NOTE — ED Notes (Signed)
Correction no emtala needed for [facility]

## 2022-02-26 NOTE — Group Note (Signed)
LCSW Group Therapy Note  Group Date: 02/26/2022 Start Time: 1300 End Time: 1347   Type of Therapy and Topic:  Group Therapy: Anger Cues and Responses  Participation Level:  Did Not Attend   Description of Group:   In this group, patients learned how to recognize the physical, cognitive, emotional, and behavioral responses they have to anger-provoking situations.  They identified a recent time they became angry and how they reacted.  They analyzed how their reaction was possibly beneficial and how it was possibly unhelpful.  The group discussed a variety of healthier coping skills that could help with such a situation in the future.  Focus was placed on how helpful it is to recognize the underlying emotions to our anger, because working on those can lead to a more permanent solution as well as our ability to focus on the important rather than the urgent.  Therapeutic Goals: Patients will remember their last incident of anger and how they felt emotionally and physically, what their thoughts were at the time, and how they behaved. Patients will identify how their behavior at that time worked for them, as well as how it worked against them. Patients will explore possible new behaviors to use in future anger situations. Patients will learn that anger itself is normal and cannot be eliminated, and that healthier reactions can assist with resolving conflict rather than worsening situations.     [place], LCSW 02/26/2022  3:58 PM

## 2022-02-26 NOTE — Plan of Care (Signed)
Nurse discussed coping skills with patient.  

## 2022-02-26 NOTE — ED Provider Notes (Signed)
Emergency Medicine Observation Re-evaluation Note  Christine Bishop is a 56 y.o. female, seen on rounds today.  Pt initially presented to the ED for complaints of Suicide Attempt and Drug Overdose Currently, the patient is sleeping.  Physical Exam  BP 123/75 (BP Location: Left Arm)   Pulse 92   Temp 98.3 F (36.8 C)   Resp 15   Ht '5\' 6"'$  (1.676 m)   Wt 72.6 kg   SpO2 95%   BMI 25.82 kg/m  Physical Exam General: no acute distress Cardiac: regular rate Lungs: no respiratory distress Psych: calm  ED Course / MDM  EKG:EKG Interpretation  Date/Time:  Saturday February 24 2022 23:00:42 EDT Ventricular Rate:  83 PR Interval:  163 QRS Duration: 88 QT Interval:  423 QTC Calculation: 498 R Axis:   65 Text Interpretation: Sinus rhythm Borderline prolonged QT interval Confirmed by Wynona Dove ([id number]) on 02/24/2022 11:06:33 PM  I have reviewed the labs performed to date as well as medications administered while in observation.  Recent changes in the last 24 hours include none.  Plan  Current plan is for awaiting psychiatric placement.    Cristie Hem, MD 02/26/22 1057

## 2022-02-26 NOTE — Tx Team (Signed)
Initial Treatment Plan 02/26/2022 7:03 PM Christine Bishop JWT:[id number]    PATIENT STRESSORS: Occupational concerns   Other: suicidal thoughts, depression and anxiety     PATIENT STRENGTHS: Ability for insight  Active sense of humor  Average or above average intelligence  Capable of independent living  Communication skills  Financial means  General fund of knowledge  Motivation for treatment/growth  Physical Health  Supportive family/friends  Work skills    PATIENT IDENTIFIED PROBLEMS: "Suicidal thoughts"  "Anxiety"  "Depression"                 DISCHARGE CRITERIA:  Ability to meet basic life and health needs Adequate post-discharge living arrangements Improved stabilization in mood, thinking, and/or behavior Medical problems require only outpatient monitoring Motivation to continue treatment in a less acute level of care Need for constant or close observation no longer present Reduction of life-threatening or endangering symptoms to within safe limits Safe-care adequate arrangements made Verbal commitment to aftercare and medication compliance  PRELIMINARY DISCHARGE PLAN: Attend aftercare/continuing care group Attend PHP/IOP Outpatient therapy Return to previous living arrangement Return to previous work or school arrangements  PATIENT/FAMILY INVOLVEMENT: This treatment plan has been presented to and reviewed with the patient, Christine Bishop..  The patient and family have been given the opportunity to ask questions and make suggestions.  Grayland Ormond [place], RN 02/26/2022, 7:03 PM
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
## 2022-02-27 MED ORDER — ESCITALOPRAM OXALATE 10 MG PO TABS
10.0000 mg | ORAL_TABLET | Freq: Every day | ORAL | Status: DC
  Administered 2022-02-28 – 2022-03-02 (×3): 10 mg via ORAL
  Filled 2022-02-27 (×5): qty 1

## 2022-02-27 MED ORDER — MELATONIN 5 MG PO TABS
5.0000 mg | ORAL_TABLET | Freq: Every day | ORAL | Status: DC
  Administered 2022-02-27 – 2022-03-03 (×5): 5 mg via ORAL
  Filled 2022-02-27 (×8): qty 1

## 2022-02-27 MED ORDER — TRAZODONE HCL 50 MG PO TABS: 50.0000 mg | ORAL_TABLET | Freq: Every day | ORAL | Status: DC

## 2022-02-27 MED ORDER — ESCITALOPRAM OXALATE 5 MG PO TABS
5.0000 mg | ORAL_TABLET | Freq: Every day | ORAL | Status: AC
  Administered 2022-02-27: 5 mg via ORAL
  Filled 2022-02-27: qty 1

## 2022-02-27 MED ORDER — BUSPIRONE HCL 5 MG PO TABS
5.0000 mg | ORAL_TABLET | Freq: Two times a day (BID) | ORAL | Status: DC
  Administered 2022-02-27 – 2022-03-04 (×10): 5 mg via ORAL
  Filled 2022-02-27 (×16): qty 1
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
## 2022-02-27 NOTE — Progress Notes (Signed)
Pt presents with anxious affect, remains medication compliant. Pt attended scheduled activities. Pt denies SI/HI/self harm thoughts. Pt signed voluntary at 610pm. Pts family visiting at this time. Q 15 minute checks ongoing for safety.

## 2022-02-27 NOTE — Progress Notes (Signed)
   02/27/22 0500  Sleep  Number of Hours 7

## 2022-02-27 NOTE — Plan of Care (Signed)
  Problem: Education: Goal: Ability to state activities that reduce stress will improve Outcome: Not Progressing   Problem: Education: Goal: Knowledge of the prescribed therapeutic regimen will improve Outcome: Not Progressing   Problem: Education: Goal: Ability to make informed decisions regarding treatment will improve Outcome: Not Progressing

## 2022-02-27 NOTE — Progress Notes (Signed)
D- Patient alert and oriented. Affect/mood. Denies SI, HI, AVH, and pain. Patient reported that she does have intermittent problems with sleep. PRN hydroxyzine withheld due to EKG results from 02/24/22 and concerns for QT prolonging. Patient responds with a wide eyed gaze when given education and information regarding treatments, testing, and stay.   A-Support and encouragement provided.  Routine safety checks conducted every 15 minutes. EKG performed. Patient informed to notify staff with problems or concerns.  R- Patient contracts for safety at this time. Patient compliant with medications and treatment plan. Patient receptive, calm, and cooperative. Patient interacts well with others on the unit.  Patient remains safe at this time.

## 2022-02-27 NOTE — [facility] Suicide Risk Assessment (Signed)
[facility] INPATIENT:  Family/Significant Other Suicide Prevention Education  Suicide Prevention Education:  Education Completed;02/27/2022  Barbaraann Rondo "Abagail Kitchens" Matsuura has been identified by the patient as the family member/significant other with whom the patient will be residing, and identified as the person(s) who will aid the patient in the event of a mental health crisis (suicidal ideations/suicide attempt).  With written consent from the patient, the family member/significant other has been provided the following suicide prevention education, prior to the and/or following the discharge of the patient.  The suicide prevention education provided includes the following: Suicide risk factors Suicide prevention and interventions National Suicide Hotline telephone number [facility] assessment telephone number [facility] Emergency Assistance [place] and/or Residential Mobile Crisis Unit telephone number  Request made of family/significant other to: Remove weapons (e.g., guns, rifles, knives), all items previously/currently identified as safety concern.   Remove drugs/medications (over-the-counter, prescriptions, illicit drugs), all items previously/currently identified as a safety concern.  The family member/significant other verbalizes understanding of the suicide prevention education information provided.  The family member/significant other agrees to remove the items of safety concern listed above.  [place] MSW, LCSW 02/27/2022, 1:04 PM
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
## 2022-02-27 NOTE — Progress Notes (Signed)
The patient rated her day as a 7 out of 10 since she is "here" in the hospital. Her positive event for the day is that she met some new people.

## 2022-02-28 ENCOUNTER — Encounter (HOSPITAL_COMMUNITY): Payer: Self-pay

## 2022-02-28 DIAGNOSIS — F411 Generalized anxiety disorder: Secondary | ICD-10-CM | POA: Diagnosis present

## 2022-02-28 LAB — LIPID PANEL
Cholesterol: 186 mg/dL (ref 0–200)
HDL: 47 mg/dL (ref >40)
LDL Cholesterol: 113 mg/dL — ABNORMAL HIGH (ref 0–99)
Total CHOL/HDL Ratio: 4 RATIO
Triglycerides: 128 mg/dL (ref <150)
VLDL: 26 mg/dL (ref 0–40)

## 2022-02-28 LAB — URINALYSIS, ROUTINE W REFLEX MICROSCOPIC
Bilirubin Urine: NEGATIVE
Glucose, UA: NEGATIVE mg/dL
Ketones, ur: NEGATIVE mg/dL
Nitrite: POSITIVE — AB
Protein, ur: NEGATIVE mg/dL
Specific Gravity, Urine: 1.012 (ref 1.005–1.030)
WBC, UA: >50 WBC/hpf — ABNORMAL HIGH (ref 0–5)
pH: 6 (ref 5.0–8.0)

## 2022-02-28 LAB — HEMOGLOBIN A1C
Hgb A1c MFr Bld: 5 % (ref 4.8–5.6)
Mean Plasma Glucose: 96.8 mg/dL

## 2022-02-28 LAB — TSH: TSH: 2.602 u[IU]/mL (ref 0.350–4.500)

## 2022-02-28 MED ORDER — LOPERAMIDE HCL 2 MG PO CAPS
4.0000 mg | ORAL_CAPSULE | ORAL | Status: DC | PRN
  Administered 2022-02-28 – 2022-03-04 (×2): 4 mg via ORAL
  Filled 2022-02-28: qty 2

## 2022-02-28 MED ORDER — PHENAZOPYRIDINE HCL 200 MG PO TABS
200.0000 mg | ORAL_TABLET | Freq: Three times a day (TID) | ORAL | Status: AC
  Administered 2022-02-28 – 2022-03-02 (×6): 200 mg via ORAL
  Filled 2022-02-28 (×6): qty 1

## 2022-02-28 MED ORDER — ROSUVASTATIN CALCIUM 5 MG PO TABS
5.0000 mg | ORAL_TABLET | ORAL | Status: DC
  Administered 2022-02-28 – 2022-03-02 (×2): 5 mg via ORAL
  Filled 2022-02-28 (×3): qty 1

## 2022-02-28 MED ORDER — HYDROXYZINE HCL 10 MG PO TABS: 10.0000 mg | ORAL_TABLET | Freq: Three times a day (TID) | ORAL | Status: DC | PRN

## 2022-02-28 MED ORDER — FLUTICASONE PROPIONATE 50 MCG/ACT NA SUSP: 1.0000 | Freq: Every day | NASAL | Status: DC

## 2022-02-28 MED ORDER — TRAZODONE HCL 50 MG PO TABS
50.0000 mg | ORAL_TABLET | Freq: Every day | ORAL | Status: DC
  Administered 2022-02-28 – 2022-03-03 (×4): 50 mg via ORAL
  Filled 2022-02-28 (×8): qty 1

## 2022-02-28 NOTE — [facility] Suicide Risk Assessment (Signed)
Suicide Risk Assessment  Admission Assessment    [facility] Admission Suicide Risk Assessment   Nursing information obtained from:  Patient Demographic factors:  Caucasian Current Mental Status:  Suicidal ideation indicated by patient, Self-harm behaviors, Self-harm thoughts Loss Factors:  NA Historical Factors:  Prior suicide attempts, Impulsivity Risk Reduction Factors:  Employed, Living with another person, especially a relative, Sense of responsibility to family  Total Time spent with patient: 45 minutes Principal Problem: MDD (major depressive disorder) Diagnosis:  Principal Problem:   MDD (major depressive disorder) Active Problems:   Suicide attempt by other psychotropic drug overdose ([place])   Generalized anxiety disorder  Subjective Data: Christine Bishop is a 56 year old female with a psychiatric history of unspecified mood disorder, GAD, and 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  Continued Clinical Symptoms:  Alcohol Use Disorder Identification Test Final Score (AUDIT): 0 The "Alcohol Use Disorders Identification Test", Guidelines for Use in Primary Care, Second Edition.  World Pharmacologist [facility], [facility]). Score between 0-7:  no or low risk or alcohol related problems. Score between 8-15:  moderate risk of alcohol related problems. Score between 16-19:  high risk of alcohol related problems. Score 20 or above:  warrants further diagnostic evaluation for alcohol dependence and treatment.   CLINICAL FACTORS:   Severe Anxiety and/or Agitation Depression:   Anhedonia Hopelessness Impulsivity Insomnia Severe   Musculoskeletal: Strength & Muscle Tone: within normal limits Gait & Station: normal Patient leans: N/A  Psychiatric Specialty Exam:  Presentation  General Appearance:  Appropriate for Environment; Casual  Eye  Contact: Fleeting  Speech: Clear and Coherent; Slow  Speech Volume: Decreased  Handedness: Right   Mood and Affect  Mood: Depressed; Anxious  Affect: Flat   Thought Process  Thought Processes: Goal Directed; Coherent  Descriptions of Associations:Intact  Orientation:Full (Time, Place and Person)  Thought Content:WDL  History of Schizophrenia/Schizoaffective disorder:No data recorded Duration of Psychotic Symptoms:Less than six months  Hallucinations:Hallucinations: None  Ideas of Reference:None  Suicidal Thoughts:Suicidal Thoughts: No  Homicidal Thoughts:Homicidal Thoughts: No   Sensorium  Memory: Immediate Good; Recent Good  Judgment: Poor  Insight: Fair   Community education officer  Concentration: Good  Attention Span: Good  Recall: Good  Fund of Knowledge: Good  Language: Good   Psychomotor Activity  Psychomotor Activity: Psychomotor Activity: Psychomotor Retardation   Assets  Assets: Communication Skills; Desire for Improvement; Housing; Intimacy; Financial Resources/Insurance; Leisure Time; Physical Health; Social Support; Vocational/Educational   Sleep  Sleep: Sleep: Poor    Physical Exam: Vitals reviewed.  Constitutional:      General: She is not in acute distress.    Appearance: She is normal weight. She is not toxic-appearing.  HENT:     Head: Normocephalic and atraumatic.     Mouth/Throat:     Mouth: Mucous membranes are moist.     Pharynx: Oropharynx is clear.  Pulmonary:     Effort: Pulmonary effort is normal.  Neurological:     General: No focal deficit present.     Mental Status: She is alert and oriented to person, place, and time.     Motor: No weakness.      Review of Systems  Constitutional:  Positive for weight loss.       4-5 lbs over past 4 weeks  Cardiovascular:  Negative for chest pain.  Gastrointestinal:  Positive for constipation. Negative for abdominal pain, diarrhea and nausea.   Genitourinary: Negative.  Musculoskeletal: Negative.   Neurological:  Negative for dizziness, seizures and headaches.    Blood pressure 118/74, pulse (!) 103, temperature 98.5 F (36.9 C), temperature source Oral, resp. rate 18, height '5\' 6"'$  (1.676 m), weight 68 kg, SpO2 96 %. Body mass index is 24.21 kg/m.   COGNITIVE FEATURES THAT CONTRIBUTE TO RISK:  Loss of executive function and Thought constriction (tunnel vision)    SUICIDE RISK:   Moderate:  Frequent suicidal ideation with limited intensity, and duration, some specificity in terms of plans, no associated intent, good self-control, limited dysphoria/symptomatology, some risk factors present, and identifiable protective factors, including available and accessible social support.  PLAN OF CARE:   See H&P for full plan of care.  I certify that inpatient services furnished can reasonably be expected to improve the patient's condition.   [name], MD 02/28/2022, 1:20 AM

## 2022-02-28 NOTE — B[facility] Group Notes (Signed)
PsychoEducational Group. Patients were given education on the power of positive re-framing. Patients were given interactive exercise in which they were asked to use positive reframing in ordinary situations.  Patients were then read poem of the power of thoughts by the [place] and asked to reflect.  Pt attended and was appropriate.

## 2022-02-28 NOTE — Group Note (Unsigned)
[facility] LCSW Group Therapy Note   Group Date: 02/28/2022 Start Time: 1300 End Time: 1400   Type of Therapy/Topic:  Group Therapy:  Balance in Life  Participation Level:  {BHH PARTICIPATION [id number]}   Description of Group:    This group will address the concept of balance and how it feels and looks when one is unbalanced. Patients will be encouraged to process areas in their lives that are out of balance, and identify reasons for remaining unbalanced. Facilitators will guide patients utilizing problem- solving interventions to address and correct the stressor making their life unbalanced. Understanding and applying boundaries will be explored and addressed for obtaining  and maintaining a balanced life. Patients will be encouraged to explore ways to assertively make their unbalanced needs known to significant others in their lives, using other group members and facilitator for support and feedback.  Therapeutic Goals: 1. Patient will identify two or more emotions or situations they have that consume much of in their lives. 2. Patient will identify signs/triggers that life has become out of balance:  3. Patient will identify two ways to set boundaries in order to achieve balance in their lives:  4. Patient will demonstrate ability to communicate their needs through discussion and/or role plays  Summary of Patient Progress:    ***    Therapeutic Modalities:   Cognitive Behavioral Therapy Solution-Focused Therapy Assertiveness Training   [place], LCSW

## 2022-02-28 NOTE — Group Note (Unsigned)
LCSW Group Therapy Note   Group Date: 02/28/2022 Start Time: 1300 End Time: 1400   Type of Therapy and Topic:  Group Therapy:   Participation Level:  {B[facility] PARTICIPATION [id number]}  Description of Group:   Therapeutic Goals:  1.     Summary of Patient Progress:    ***  Therapeutic Modalities:   Windle Guard, LCSWA 02/28/2022  3:43 PM

## 2022-02-28 NOTE — Progress Notes (Signed)
Patient ID: Christine Bishop, female   DOB: [birth year], 56 y.o.   MRN: [id number] Pt presents with depressed mood, affect congruent. [name] reports she feels '' bad this morning '' Patient states '' I have a UTI. I have burning, frequency and urgency and I think I am dehydrated. My urine also smells bad. '' Patient presents very blunted, staring vacantly at times when asked questions. She was given urine cup due to described above symptoms and po fluids encouraged to assist with UTI. Gatorade also given. Patient denies any SI or HI but does agree she feels depressed. Pt is isolative to room thus far, but able to make her needs known. Compliant with am medications. Above discussed with MD and treatment team. Pt is safe, will con't to monitor.

## 2022-02-28 NOTE — Plan of Care (Signed)
  Problem: Education: Goal: Ability to state activities that reduce stress will improve Outcome: Progressing   Problem: Education: Goal: Utilization of techniques to improve thought processes will improve Outcome: Progressing Goal: Knowledge of the prescribed therapeutic regimen will improve Outcome: Progressing   Problem: Education: Goal: Knowledge of [facility] Education information/materials will improve Outcome: Progressing   Problem: Education: Goal: Ability to make informed decisions regarding treatment will improve Outcome: Progressing

## 2022-02-28 NOTE — BHH Group Notes (Signed)
Adult Psychoeducational Group Note  Date:  02/28/2022 Time:  10:31 AM  Group Topic/Focus:  Goals Group:   The focus of this group is to help patients establish daily goals to achieve during treatment and discuss how the patient can incorporate goal setting into their daily lives to aide in recovery.  Participation Level:  Did Not Attend  Christine Bishop 02/28/2022, 10:31 AM

## 2022-02-28 NOTE — Progress Notes (Signed)
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   24 Hour Chart Review: Patient has been compliant with all scheduled medications, and has not required behavioral PRNs.  She attended groups appropriately last night.  Yesterday's Plan per Psychiatry Team: -- Start Lexapro 5 mg; will increase to 10 mg tomorrow -- Continue home Buspar 5 mg BID -- Melatonin 5 mg qHS for insomnia -- Discontinued home Abilify as it may be contributing to patient's flat affect.  On Evaluation Today: Patient is mildly less flat in affect, but still appears anxious.  Today, patient reports not feeling well due to urinary tract pain, sx consistent with urinary tract infection, for which she completed a course of Macrobid last Wednesday.  Patient endorses the pain so severe as well as worry about another urinary tract infection and fecal incontinence that it kept her up last night.  She endorses burning, itching, and dysuria, as well as increased urinary frequency and urgency.  She also endorses some soft and loose, watery stools during the time of urination.  She is worried about attend groups and going to meals due to both her urinary and fecal urgency.  She did not eat breakfast this morning.  She otherwise reports minimal changes to her mood today, but denies SI (both active and passive), HI, AVH, paranoia, and does not voice delusions.  She denies further somatic complaints other than those previously reported.  She denies adverse effects of medications, both of restarting Lexapro and discontinuing Abilify.   Principal Problem: MDD (major depressive disorder) Diagnosis: Principal Problem:   MDD (major  depressive disorder) Active Problems:   Suicide attempt by other psychotropic drug overdose ([place])   Generalized anxiety disorder   Total Time spent with patient: 30 minutes  Past Psychiatric History: See H&P  Past Medical History:  Past Medical History:  Diagnosis Date   Anxiety    Dysplastic nevus 12/24/2019   R upper back paraspinal - moderate   Dysplastic nevus 12/24/2019   R to mid upper back 3.0 cm lat to spine above braline - mild   History of migraine headaches    Posterior vitreous detachment of left eye 12/14/2019   Vitreous membranes and strands, left 04/20/2020   Vitrectomy left eye 04-27-20    Past Surgical History:  Procedure Laterality Date   CHOLECYSTECTOMY OPEN     LASIK     LEEP     Family History:  Family History  Problem Relation Age of Onset   Cancer Mother    Hypertension Father    Hyperlipidemia Father    Cancer Maternal Grandmother    Family Psychiatric  History: See H&P Social History:  Social History   Substance and Sexual Activity  Alcohol Use No     Social History   Substance and Sexual Activity  Drug Use No    Social History   Socioeconomic History   Marital status: Married    Spouse name: Not on file   Number of children: 2   Years of education: Not on file   Highest education level: Not on file  Occupational History   Not on file  Tobacco Use   Smoking status: Former    Types: Cigarettes    Quit date:  06/07/2011    Years since quitting: 10.7    Passive exposure: Past   Smokeless tobacco: Never   Tobacco comments:    No smoking, does not need nicotine patch or gum  Vaping Use   Vaping Use: Never used  Substance and Sexual Activity   Alcohol use: No   Drug use: No   Sexual activity: Yes    Birth control/protection: None  Other Topics Concern   Not on file  Social History Narrative   Not on file   Social Determinants of Health   Financial Resource Strain: Not on file  Food Insecurity: No Food Insecurity  (02/26/2022)   Hunger Vital Sign    Worried About Running Out of Food in the Last Year: Never true    Ran Out of Food in the Last Year: Never true  Transportation Needs: No Transportation Needs (02/26/2022)   PRAPARE - Hydrologist (Medical): No    Lack of Transportation (Non-Medical): No  Physical Activity: Not on file  Stress: Not on file  Social Connections: Not on file   Additional Social History:                         Sleep: Poor  Appetite:  Fair  Current Medications: Current Facility-Administered Medications  Medication Dose Route Frequency Provider Last Rate Last Admin   acetaminophen (TYLENOL) tablet 650 mg  650 mg Oral Q6H PRN Vesta Mixer, NP       alum & mag hydroxide-simeth (MAALOX/MYLANTA) 200-200-20 MG/5ML suspension 30 mL  30 mL Oral Q4H PRN Vesta Mixer, NP       busPIRone (BUSPAR) tablet 5 mg  5 mg Oral BID Massengill, Ovid Curd, MD   5 mg at 02/27/22 1603   escitalopram (LEXAPRO) tablet 10 mg  10 mg Oral Daily Massengill, Nathan, MD       hydrOXYzine (ATARAX) tablet 10 mg  10 mg Oral TID PRN [name], MD       melatonin tablet 5 mg  5 mg Oral QHS Massengill, Nathan, MD   5 mg at 02/27/22 2116   rosuvastatin (CRESTOR) tablet 5 mg  5 mg Oral Q M,W,F [name], MD        Lab Results:  No results found for this or any previous visit (from the past 48 hour(s)).  Blood Alcohol level:  Lab Results  Component Value Date   ETH <10 02/24/2022   ETH <10 09/98/3382    Metabolic Disorder Labs: Lab Results  Component Value Date   HGBA1C 4.8 09/19/2021   MPG 91.06 09/19/2021   No results found for: "PROLACTIN" Lab Results  Component Value Date   CHOL 170 06/22/2021   TRIG 123 06/22/2021   HDL 46 06/22/2021   CHOLHDL 3.7 06/22/2021   LDLCALC 102 (H) 06/22/2021    Physical Findings:  Musculoskeletal: Strength & Muscle Tone: within normal limits Gait & Station: normal although slowed Patient leans:  N/A  Psychiatric Specialty Exam:  Presentation  General Appearance:  Appropriate for Environment; Casual   Eye Contact: Fleeting Although slightly improved  Speech: Clear and Coherent; Slow   Speech Volume: Decreased   Handedness: Right    Mood and Affect  Mood: Depressed; Anxious   Affect: Flat But mildly less today   Thought Process  Thought Processes: Goal Directed; Coherent   Descriptions of Associations:Intact   Orientation:Full (Time, Place and Person)   Thought Content:WDL   History of Schizophrenia/Schizoaffective disorder: Denies  Duration of Psychotic Symptoms:Less than six months   Hallucinations:Hallucinations: None  Ideas of Reference:None   Suicidal Thoughts:Suicidal Thoughts: No  Homicidal Thoughts:Homicidal Thoughts: No   Sensorium  Memory: Immediate Good; Recent Good   Judgment: Poor   Insight: Fair    Community education officer  Concentration: Good   Attention Span: Good   Recall: Good   Fund of Knowledge: Good   Language: Good    Psychomotor Activity  Psychomotor Activity: Psychomotor Activity: Psychomotor Retardation   Assets  Assets: Communication Skills; Desire for Improvement; Housing; Intimacy; Financial Resources/Insurance; Leisure Time; Physical Health; Social Support; Vocational/Educational    Sleep  Sleep: Sleep: Poor    Physical Exam: Physical Exam Vitals reviewed.  Constitutional:      General: She is not in acute distress. HENT:     Head: Normocephalic and atraumatic.     Mouth/Throat:     Mouth: Mucous membranes are moist.     Pharynx: Oropharynx is clear.  Pulmonary:     Effort: Pulmonary effort is normal.  Skin:    General: Skin is warm and dry.  Neurological:     General: No focal deficit present.     Mental Status: She is alert and oriented to person, place, and time.    Review of Systems  Constitutional:  Positive for weight loss. Negative for chills  and fever.       4 to 5 pounds over the past 4 weeks  Respiratory:  Negative for shortness of breath.   Cardiovascular:  Negative for chest pain and palpitations.  Gastrointestinal:  Positive for diarrhea. Negative for abdominal pain, constipation, nausea and vomiting.  Genitourinary:  Positive for dysuria, frequency and urgency. Negative for hematuria.  Musculoskeletal: Negative.   Neurological:  Negative for dizziness, weakness and headaches.   Blood pressure 120/86, pulse (!) 124, temperature 98.8 F (37.1 C), temperature source Oral, resp. rate 18, height '5\' 6"'$  (1.676 m), weight 68 kg, SpO2 98 %. Body mass index is 24.21 kg/m.   Treatment Plan Summary: ASSESSMENT: Principal Problem:   MDD (major depressive disorder) Active Problems:   Suicide attempt by other psychotropic drug overdose ([place])   Generalized anxiety disorder     PLAN: Safety and Monitoring: voluntarily admission to inpatient psychiatric unit for safety, stabilization and treatment; was initially under IVC, but rescinded Daily contact with patient to assess and evaluate symptoms and progress in treatment Patient's case to be discussed in multi-disciplinary team meeting Observation Level : q15 minute checks Vital signs: q12 hours Precautions: suicide, elopement, and assault   2. Psychiatric Diagnoses and Treatment #MDD- recurrent/severe/without psychotic features #GAD -- Start Lexapro 10 mg for depression and anxiety -- Continue home Buspar 5 mg BID for anxiety -- Melatonin 5 mg qHS for insomnia -Restart trazodone 50 mg nightly for insomnia; has now been 4 days since overdose on trazodone, so will trial resuming. -- Discontinued home Abilify as it may be contributing to patient's flat affect.   PRN: Tylenol, Maalox, and hydroxyzine 10 mg TID PRN anxiety/insomnia   -- The risks/benefits/side-effects/alternatives to this medication were discussed in detail with the patient and time was given for questions. The  patient consents to medication trial.              -- Metabolic profile and EKG monitoring obtained while on an atypical antipsychotic  BMI: 24.21 TSH: 2.602 Lipid Panel: LDL 113 HbgA1c: 5.0% QTc: 426, and 498 on repeat             -- Encouraged  patient to participate in unit milieu and in scheduled group therapies    3. Medical Issues Being Addressed: #Hyperlipidemia -- Continue home Rosuvastatin 5 mg 3x weekly.    4. Discharge Planning:              -- Social work and case management to assist with discharge planning and identification of hospital follow-up needs prior to discharge             -- Estimated LOS: 5-7 days             -- Discharge Concerns: Need to establish a safety plan; Medication compliance and effectiveness             -- Discharge Goals: Return home with outpatient referrals for mental health follow-up including medication management/psychotherapy     [name], MD 02/28/2022, 7:55 AM

## 2022-02-28 NOTE — Progress Notes (Signed)
   02/28/22 0530  Sleep  Number of Hours 6.5

## 2022-02-28 NOTE — BH IP Treatment Plan (Signed)
Interdisciplinary Treatment and Diagnostic Plan Update  02/28/2022 Time of Session: [place] MRN: [id number]  Principal Diagnosis: MDD (major depressive disorder)  Secondary Diagnoses: Principal Problem:   MDD (major depressive disorder) Active Problems:   Suicide attempt by other psychotropic drug overdose ([place])   Generalized anxiety disorder   Current Medications:  Current Facility-Administered Medications  Medication Dose Route Frequency Provider Last Rate Last Admin   acetaminophen (TYLENOL) tablet 650 mg  650 mg Oral Q6H PRN Vesta Mixer, NP       alum & mag hydroxide-simeth (MAALOX/MYLANTA) 200-200-20 MG/5ML suspension 30 mL  30 mL Oral Q4H PRN Vesta Mixer, NP       busPIRone (BUSPAR) tablet 5 mg  5 mg Oral BID Massengill, Ovid Curd, MD   5 mg at 02/28/22 0810   escitalopram (LEXAPRO) tablet 10 mg  10 mg Oral Daily Massengill, Ovid Curd, MD   10 mg at 02/28/22 0810   hydrOXYzine (ATARAX) tablet 10 mg  10 mg Oral TID PRN [name], MD       melatonin tablet 5 mg  5 mg Oral QHS Massengill, Nathan, MD   5 mg at 02/27/22 2116   rosuvastatin (CRESTOR) tablet 5 mg  5 mg Oral Q M,W,F [name], MD   5 mg at 02/28/22 8185   PTA Medications: Medications Prior to Admission  Medication Sig Dispense Refill Last Dose   ARIPiprazole (ABILIFY) 5 MG tablet Take 5 mg by mouth daily.      Atogepant (QULIPTA) 60 MG TABS Take 60 mg by mouth daily at 6 (six) AM. (Patient not taking: Reported on 02/25/2022)      busPIRone (BUSPAR) 5 MG tablet Take 5 mg by mouth 2 (two) times daily.      cyclobenzaprine (FLEXERIL) 10 MG tablet TAKE 1 TABLET BY MOUTH AT BEDTIME AS NEEDED FOR MUSCLE SPASMS (Patient not taking: Reported on 02/25/2022) 30 tablet 1    Diclofenac Potassium,Migraine, 50 MG PACK Take 50 mg by mouth 2 (two) times daily as needed (migraine). (Patient not taking: Reported on 02/25/2022)      doxycycline (VIBRA-TABS) 100 MG tablet Take 1 tablet twice a day with food  (Patient not taking: Reported on 02/25/2022) 60 tablet 0    escitalopram (LEXAPRO) 20 MG tablet Take 1 tablet (20 mg total) by mouth daily. (Patient not taking: Reported on 10/12/2021) 30 tablet 3    ketorolac (ACULAR) 0.5 % ophthalmic solution INSTILL 1 DROP INTO THE RIGHT EYE 4 TIMES A DAY AS DIRECTED (Patient taking differently: Place 1 drop into the right eye in the morning and at bedtime.) 5 mL 6    METRONIDAZOLE, TOPICAL, 0.75 % LOTN Apply a thin coat to the entire face QHS (Patient not taking: Reported on 02/25/2022) 59 mL 11    Multiple Vitamins-Minerals (PRESERVISION AREDS PO) Take 1 tablet by mouth in the morning and at bedtime.      OLANZapine (ZYPREXA) 5 MG tablet Take 5 mg by mouth at bedtime. (Patient not taking: Reported on 02/25/2022)      predniSONE (STERAPRED UNI-PAK 48 TAB) 10 MG (48) TBPK tablet 12 day taper - take by mouth as directed for 12 days (Patient not taking: Reported on 02/25/2022) 48 tablet 0    Propylene Glycol (SYSTANE COMPLETE) 0.6 % SOLN Place 2 drops into both eyes daily as needed (For dry eyes).      rosuvastatin (CRESTOR) 5 MG tablet Take 5 mg by mouth daily. (Patient not taking: Reported on 02/25/2022)  SUMAtriptan Succinate 6 MG/0.5ML SOCT Inject 0.6 mg into the skin See admin instructions. Inject 0.6 mg subcutaneously once daily as needed for migraine. May repeat dose in 2 hours if needed. No more than 2 doses in 24 hours. (Patient not taking: Reported on 02/25/2022)      tiZANidine (ZANAFLEX) 4 MG tablet Take 2-4 mg by mouth at bedtime as needed for muscle spasms.      traZODone (DESYREL) 50 MG tablet TAKE 1/2 TO 1 TABLET BY MOUTH AT BEDTIME AS NEEDED FOR SLEEP (Patient taking differently: Take 25-50 mg by mouth at bedtime as needed for sleep.) 90 tablet 0     Patient Stressors: Occupational concerns   Other: suicidal thoughts, depression and anxiety    Patient Strengths: Ability for insight  Active sense of humor  Average or above average intelligence   Capable of independent living  Engineer, drilling fund of knowledge  Motivation for treatment/growth  Physical Health  Supportive family/friends  Work skills   Treatment Modalities: Medication Management, Group therapy, Case management,  1 to 1 session with clinician, Psychoeducation, Recreational therapy.   Physician Treatment Plan for Primary Diagnosis: MDD (major depressive disorder) Long Term Goal(s): Improvement in symptoms so as ready for discharge   Short Term Goals: Ability to identify changes in lifestyle to reduce recurrence of condition will improve Ability to verbalize feelings will improve Ability to disclose and discuss suicidal ideas Ability to demonstrate self-control will improve Ability to identify and develop effective coping behaviors will improve Ability to maintain clinical measurements within normal limits will improve Compliance with prescribed medications will improve Ability to identify triggers associated with substance abuse/mental health issues will improve  Medication Management: Evaluate patient's response, side effects, and tolerance of medication regimen.  Therapeutic Interventions: 1 to 1 sessions, Unit Group sessions and Medication administration.  Evaluation of Outcomes: Progressing  Physician Treatment Plan for Secondary Diagnosis: Principal Problem:   MDD (major depressive disorder) Active Problems:   Suicide attempt by other psychotropic drug overdose ([place])   Generalized anxiety disorder  Long Term Goal(s): Improvement in symptoms so as ready for discharge   Short Term Goals: Ability to identify changes in lifestyle to reduce recurrence of condition will improve Ability to verbalize feelings will improve Ability to disclose and discuss suicidal ideas Ability to demonstrate self-control will improve Ability to identify and develop effective coping behaviors will improve Ability to maintain clinical  measurements within normal limits will improve Compliance with prescribed medications will improve Ability to identify triggers associated with substance abuse/mental health issues will improve     Medication Management: Evaluate patient's response, side effects, and tolerance of medication regimen.  Therapeutic Interventions: 1 to 1 sessions, Unit Group sessions and Medication administration.  Evaluation of Outcomes: Progressing   RN Treatment Plan for Primary Diagnosis: MDD (major depressive disorder) Long Term Goal(s): Knowledge of disease and therapeutic regimen to maintain health will improve  Short Term Goals: Ability to remain free from injury will improve, Ability to verbalize frustration and anger appropriately will improve, Ability to demonstrate self-control, Ability to participate in decision making will improve, Ability to verbalize feelings will improve, Ability to disclose and discuss suicidal ideas, Ability to identify and develop effective coping behaviors will improve, and Compliance with prescribed medications will improve  Medication Management: RN will administer medications as ordered by provider, will assess and evaluate patient's response and provide education to patient for prescribed medication. RN will report any adverse and/or side effects to prescribing  provider.  Therapeutic Interventions: 1 on 1 counseling sessions, Psychoeducation, Medication administration, Evaluate responses to treatment, Monitor vital signs and CBGs as ordered, Perform/monitor CIWA, COWS, AIMS and Fall Risk screenings as ordered, Perform wound care treatments as ordered.  Evaluation of Outcomes: Progressing   LCSW Treatment Plan for Primary Diagnosis: MDD (major depressive disorder) Long Term Goal(s): Safe transition to appropriate next level of care at discharge, Engage patient in therapeutic group addressing interpersonal concerns.  Short Term Goals: Engage patient in aftercare planning  with referrals and resources, Increase social support, Increase ability to appropriately verbalize feelings, Increase emotional regulation, Facilitate acceptance of mental health diagnosis and concerns, Facilitate patient progression through stages of change regarding substance use diagnoses and concerns, Identify triggers associated with mental health/substance abuse issues, and Increase skills for wellness and recovery  Therapeutic Interventions: Assess for all discharge needs, 1 to 1 time with Social worker, Explore available resources and support systems, Assess for adequacy in community support network, Educate family and significant other(s) on suicide prevention, Complete Psychosocial Assessment, Interpersonal group therapy.  Evaluation of Outcomes: Progressing   Progress in Treatment: Attending groups: No. Participating in groups: No. Taking medication as prescribed: Yes. Toleration medication: Yes. Family/Significant other contact made: Yes, individual(s) contacted:  Doylene Bode" [address]s  Patient understands diagnosis: Yes. Discussing patient identified problems/goals with staff: Yes. Medical problems stabilized or resolved: Yes. Denies suicidal/homicidal ideation: No. Issues/concerns per patient self-inventory: Yes. Other: none  New problem(s) identified: No, Describe:  none  New Short Term/Long Term Goal(s): Patient to work towards medication management for mood stabilization; elimination of SI thoughts; development of comprehensive mental wellness plan.  Patient Goals: Patient states their goal for treatment is to "be able to go home and be safe. I feel better and this don't happen again.."  Discharge Plan or Barriers: No psychosocial barriers identified at this time, patient to return to place of residence when appropriate for discharge.   Reason for Continuation of Hospitalization: Depression  Estimated Length of Stay: 1-7 days   Last 3 [place] Suicide Severity Risk  Score: Flowsheet Row Admission (Current) from 02/26/2022 in [facility] ED from 02/24/2022 in [place] ED from 09/19/2021 in [place] High Risk High Risk No Risk       Last [facility] 2/9 Scores:    10/12/2021   11:23 AM 07/13/2021    8:43 AM 06/19/2021   10:40 AM  Depression screen PHQ 2/9  Decreased Interest 0 0 0  Down, Depressed, Hopeless 0 0 0  PHQ - 2 Score 0 0 0  Altered sleeping 2 0 0  Tired, decreased energy 2 0 0  Change in appetite 1 0 0  Feeling bad or failure about yourself  0 0 0  Trouble concentrating 1 0 0  Moving slowly or fidgety/restless 1 0 0  Suicidal thoughts 0 0 0  PHQ-9 Score 7 0 0    Scribe for Treatment Team: Larose Kells 02/28/2022 10:58 AM

## 2022-02-28 NOTE — Group Note (Signed)
LCSW Group Therapy Note  Group Date: 02/28/2022 Start Time: 1300 End Time: 1345   Type of Therapy and Topic:  Group Therapy: Anger Cues and Responses  Participation Level:  Active   Description of Group:   In this group, patients learned how to recognize the physical, cognitive, emotional, and behavioral responses they have to anger-provoking situations.  They identified a recent time they became angry and how they reacted.  They analyzed how their reaction was possibly beneficial and how it was possibly unhelpful.  The group discussed a variety of healthier coping skills that could help with such a situation in the future.  Focus was placed on how helpful it is to recognize the underlying emotions to our anger, because working on those can lead to a more permanent solution as well as our ability to focus on the important rather than the urgent.  Therapeutic Goals: Patients will remember their last incident of anger and how they felt emotionally and physically, what their thoughts were at the time, and how they behaved. Patients will identify how their behavior at that time worked for them, as well as how it worked against them. Patients will explore possible new behaviors to use in future anger situations. Patients will learn that anger itself is normal and cannot be eliminated, and that healthier reactions can assist with resolving conflict rather than worsening situations.  Summary of Patient Progress:  Christine Bishop was active during the group. he shared a recent occurrence wherein feeling triggered led to anger. she demonstrated appropriate insight into the subject matter, was respectful of peers, and participated throughout the entire session.  Therapeutic Modalities:   Cognitive Behavioral Therapy    Windle Guard, LCSW 02/28/2022  3:51 PM

## 2022-03-01 NOTE — Progress Notes (Signed)
Pt on unit. Pt medication compliant. Pt minimally interacts with peers and staff. Denies suicidal thoughts. Q 15 minute checks ongoing for safety.

## 2022-03-01 NOTE — Progress Notes (Signed)
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   24 Hour Chart Review: Patient has been compliant with all scheduled medications, and has not required behavioral PRNs.  She attended groups appropriately last night.  Yesterday's Plan per Psychiatry Team: --Increased to Lexapro 10 mg -- Continue home Buspar 5 mg BID -- Melatonin 5 mg qHS for insomnia --Previously discontinue home Abilify as it may be contributing to patient's flat affect.  On Evaluation Today: Patient is blunted in affect rather than flat, and still appears somewhat anxious, but less than previous days.  Today, patient reports sleeping much better due to almost complete resolution of urinary tract pain.  As well, she denies further diarrheal episodes.  She denies other somatic symptoms.  Her appetite has improved, and she is no longer anxious about attending meals nor groups.  She otherwise continues to report minimal changes to her mood today, but denies SI (both active and passive), HI, AVH, paranoia, and does not voice delusions.  She denies adverse effects of medications.   Principal Problem: MDD (major depressive disorder) Diagnosis: Principal Problem:   MDD (major depressive disorder) Active Problems:   Suicide attempt by other psychotropic drug overdose ([place])   Generalized anxiety disorder   Total Time spent with patient: 30 minutes  Past Psychiatric History: See H&P  Past Medical History:  Past Medical History:  Diagnosis Date   Anxiety    Dysplastic nevus 12/24/2019   R upper back paraspinal - moderate   Dysplastic nevus 12/24/2019   R to mid upper back 3.0 cm lat to spine above braline - mild    History of migraine headaches    Posterior vitreous detachment of left eye 12/14/2019   Vitreous membranes and strands, left 04/20/2020   Vitrectomy left eye 04-27-20    Past Surgical History:  Procedure Laterality Date   CHOLECYSTECTOMY OPEN     LASIK     LEEP     Family History:  Family History  Problem Relation Age of Onset   Cancer Mother    Hypertension Father    Hyperlipidemia Father    Cancer Maternal Grandmother    Family Psychiatric  History: See H&P Social History:  Social History   Substance and Sexual Activity  Alcohol Use No     Social History   Substance and Sexual Activity  Drug Use No    Social History   Socioeconomic History   Marital status: Married    Spouse name: Not on file   Number of children: 2   Years of education: Not on file   Highest education level: Not on file  Occupational History   Not on file  Tobacco Use   Smoking status: Former    Types: Cigarettes    Quit date: 06/07/2011    Years since quitting: 10.7    Passive exposure: Past   Smokeless tobacco: Never   Tobacco comments:    No smoking, does not need nicotine patch or gum  Vaping Use   Vaping Use: Never used  Substance and Sexual Activity   Alcohol use: No   Drug use: No   Sexual activity: Yes    Birth control/protection: None  Other Topics Concern   Not on file  Social History Narrative   Not on file   Social Determinants of Health   Financial Resource Strain: Not on file  Food Insecurity: No Food Insecurity (02/26/2022)   Hunger Vital Sign    Worried About Running Out of Food in the Last Year: Never true    Ran Out of Food in the Last Year: Never true  Transportation Needs: No Transportation Needs (02/26/2022)   PRAPARE - Hydrologist (Medical): No    Lack of Transportation (Non-Medical): No  Physical Activity: Not on file  Stress: Not on file  Social Connections: Not on file   Additional Social History:                          Sleep: Good  Appetite:  Fair  Current Medications: Current Facility-Administered Medications  Medication Dose Route Frequency Provider Last Rate Last Admin   acetaminophen (TYLENOL) tablet 650 mg  650 mg Oral Q6H PRN Vesta Mixer, NP       alum & mag hydroxide-simeth (MAALOX/MYLANTA) 200-200-20 MG/5ML suspension 30 mL  30 mL Oral Q4H PRN Vesta Mixer, NP       busPIRone (BUSPAR) tablet 5 mg  5 mg Oral BID Massengill, Ovid Curd, MD   5 mg at 03/01/22 0755   escitalopram (LEXAPRO) tablet 10 mg  10 mg Oral Daily Massengill, Ovid Curd, MD   10 mg at 03/01/22 0755   hydrOXYzine (ATARAX) tablet 10 mg  10 mg Oral TID PRN [name], MD       loperamide (IMODIUM) capsule 4 mg  4 mg Oral PRN Massengill, Ovid Curd, MD   4 mg at 02/28/22 1134   melatonin tablet 5 mg  5 mg Oral QHS Massengill, Ovid Curd, MD   5 mg at 02/28/22 2118   phenazopyridine (PYRIDIUM) tablet 200 mg  200 mg Oral TID WC [name], MD   200 mg at 03/01/22 1355   rosuvastatin (CRESTOR) tablet 5 mg  5 mg Oral Q M,W,F [name], MD   5 mg at 02/28/22 5397   traZODone (DESYREL) tablet 50 mg  50 mg Oral QHS Massengill, Ovid Curd, MD   50 mg at 02/28/22 2117    Lab Results:  Results for orders placed or performed during the hospital encounter of 02/26/22 (from the past 48 hour(s))  TSH     Status: None   Collection Time: 02/28/22  7:40 AM  Result Value Ref Range   TSH 2.602 0.350 - 4.500 uIU/mL    Comment: Performed by a 3rd Generation assay with a functional sensitivity of <=0.01 uIU/mL. Performed at [facility], [place] [address], [place], [place] [address]   Lipid panel     Status: Abnormal   Collection Time: 02/28/22  7:40 AM  Result Value Ref Range   Cholesterol 186 0 - 200 mg/dL   Triglycerides 128 <150 mg/dL   HDL 47 >40 mg/dL   Total CHOL/HDL Ratio 4.0 RATIO   VLDL 26 0 - 40 mg/dL   LDL Cholesterol 113 (H) 0 - 99 mg/dL    Comment:        Total Cholesterol/HDL:CHD Risk Coronary  Heart Disease Risk Table                     Men   Women  1/2 Average Risk   3.4   3.3  Average Risk       5.0   4.4  2 X Average  Risk   9.6   7.1  3 X Average Risk  23.4   11.0        Use the calculated Patient Ratio above and the CHD Risk Table to determine the patient's CHD Risk.        ATP III CLASSIFICATION (LDL):  <100     mg/dL   Optimal  100-129  mg/dL   Near or Above                    Optimal  130-159  mg/dL   Borderline  160-189  mg/dL   High  >190     mg/dL   Very High Performed at [facility] [address] [place], [place] [address]   Hemoglobin A1c     Status: None   Collection Time: 02/28/22  7:40 AM  Result Value Ref Range   Hgb A1c MFr Bld 5.0 4.8 - 5.6 %    Comment: (NOTE) Pre diabetes:          5.7%-6.4%  Diabetes:              >6.4%  Glycemic control for   <7.0% adults with diabetes    Mean Plasma Glucose 96.8 mg/dL    Comment: Performed at [facility] [address], [place], [place] [address]  Urinalysis, Routine w reflex microscopic Urine, Unspecified Source     Status: Abnormal   Collection Time: 02/28/22  8:49 AM  Result Value Ref Range   Color, Urine YELLOW YELLOW   APPearance CLOUDY (A) CLEAR   Specific Gravity, Urine 1.012 1.005 - 1.030   pH 6.0 5.0 - 8.0   Glucose, UA NEGATIVE NEGATIVE mg/dL   Hgb urine dipstick SMALL (A) NEGATIVE   Bilirubin Urine NEGATIVE NEGATIVE   Ketones, ur NEGATIVE NEGATIVE mg/dL   Protein, ur NEGATIVE NEGATIVE mg/dL   Nitrite POSITIVE (A) NEGATIVE   Leukocytes,Ua LARGE (A) NEGATIVE   RBC / HPF 0-5 0 - 5 RBC/hpf   WBC, UA >50 (H) 0 - 5 WBC/hpf   Bacteria, UA MANY (A) NONE SEEN   Squamous Epithelial / LPF 21-50 0 - 5   WBC Clumps PRESENT    Mucus PRESENT    Non Squamous Epithelial 0-5 (A) NONE SEEN    Comment: Performed at [facility], [place] [address], [place], [place] [address]  Urine Culture     Status: Abnormal (Preliminary result)   Collection Time:  02/28/22 11:43 AM   Specimen: Urine, Clean Catch  Result Value Ref Range   Specimen Description      URINE, CLEAN CATCH Performed at [facility], [place] [address] [place], [place] [address]    Special Requests      NONE Performed at [facility], [place] [address], [place], [place] [address]    Culture (A)     >=100,000 COLONIES/mL ESCHERICHIA COLI SUSCEPTIBILITIES TO FOLLOW Performed at [facility] [address] [place], [place] [address]    Report Status PENDING     Blood Alcohol level:  Lab Results  Component Value Date   [facility] <10 02/24/2022   ETH <10 19/50/9326    Metabolic Disorder Labs: Lab Results  Component Value Date   HGBA1C 5.0 02/28/2022   MPG 96.8 02/28/2022   MPG 91.06 09/19/2021   No results found for: "PROLACTIN" Lab Results  Component Value Date   CHOL 186 02/28/2022   TRIG 128 02/28/2022   HDL  47 02/28/2022   CHOLHDL 4.0 02/28/2022   VLDL 26 02/28/2022   LDLCALC 113 (H) 02/28/2022   LDLCALC 102 (H) 06/22/2021    Physical Findings:  Musculoskeletal: Strength & Muscle Tone: within normal limits Gait & Station: normal although slowed Patient leans: N/A  Psychiatric Specialty Exam:  Presentation  General Appearance:  Appropriate for Environment; Casual   Eye Contact: Fair Although slightly improved  Speech: Clear and Coherent; Normal Rate   Speech Volume: Normal   Handedness: Right    Mood and Affect  Mood: -- (Less depressed, less anxious "I slept much better last night")   Affect: Blunt But mildly less today   Thought Process  Thought Processes: Coherent; Goal Directed   Descriptions of Associations:Intact   Orientation:Full (Time, Place and Person)   Thought Content:WDL   History of Schizophrenia/Schizoaffective disorder: Denies  Duration of Psychotic Symptoms:Less than six months   Hallucinations:Hallucinations: None  Ideas of  Reference:None   Suicidal Thoughts:Suicidal Thoughts: No  Homicidal Thoughts:Homicidal Thoughts: No   Sensorium  Memory: Immediate Good; Recent Good   Judgment: Fair   Insight: Fair    Community education officer  Concentration: Good   Attention Span: Good   Recall: Good   Fund of Knowledge: Good   Language: Good    Psychomotor Activity  Psychomotor Activity: Psychomotor Activity: Psychomotor Retardation (But less)   Assets  Assets: Communication Skills; Desire for Improvement; Housing; Intimacy; Leisure Time; Physical Health; Social Support; Catering manager; Vocational/Educational    Sleep  Sleep: Sleep: Fair Number of Hours of Sleep: 8    Physical Exam: Physical Exam Vitals reviewed.  Constitutional:      General: She is not in acute distress. HENT:     Head: Normocephalic and atraumatic.     Mouth/Throat:     Mouth: Mucous membranes are moist.     Pharynx: Oropharynx is clear.  Pulmonary:     Effort: Pulmonary effort is normal.  Skin:    General: Skin is warm and dry.  Neurological:     General: No focal deficit present.     Mental Status: She is alert and oriented to person, place, and time.    Review of Systems  Constitutional:  Positive for weight loss. Negative for chills and fever.       4 to 5 pounds over the past 4 weeks  Respiratory:  Negative for shortness of breath.   Cardiovascular:  Negative for chest pain and palpitations.  Gastrointestinal:  Positive for diarrhea. Negative for abdominal pain, constipation, nausea and vomiting.  Genitourinary:  Positive for dysuria, frequency and urgency. Negative for hematuria.  Musculoskeletal: Negative.   Neurological:  Negative for dizziness, weakness and headaches.   Blood pressure 106/72, pulse (!) 124, temperature 99.3 F (37.4 C), temperature source Oral, resp. rate 18, height '5\' 6"'$  (1.676 m), weight 68 kg, SpO2 96 %. Body mass index is 24.21  kg/m.   Treatment Plan Summary: ASSESSMENT: Principal Problem:   MDD (major depressive disorder) Active Problems:   Suicide attempt by other psychotropic drug overdose ([place])   Generalized anxiety disorder   Patient is a 56 year old female who presented with flat affect which has improved with discontinuation of Abilify as well as symptom management of urinary tract infection.  We will continue to monitor as UTI is treated.  Patient requires continued inpatient admission for crisis stabilization and medication optimization.   PLAN: Safety and Monitoring: voluntarily admission to inpatient psychiatric unit for safety, stabilization and treatment; was initially under IVC, but rescinded  Daily contact with patient to assess and evaluate symptoms and progress in treatment Patient's case to be discussed in multi-disciplinary team meeting Observation Level : q15 minute checks Vital signs: q12 hours Precautions: suicide, elopement, and assault   2. Psychiatric Diagnoses and Treatment #MDD- recurrent/severe/without psychotic features #GAD -- Continue Lexapro 10 mg for depression and anxiety -- Continue home Buspar 5 mg BID for anxiety -- Melatonin 5 mg qHS for insomnia -Continue trazodone 50 mg nightly for insomnia. -- Previously discontinued home Abilify as it may be contributing to patient's flat affect.   PRN: Tylenol, Maalox, and hydroxyzine 10 mg TID PRN anxiety/insomnia   -- The risks/benefits/side-effects/alternatives to this medication were discussed in detail with the patient and time was given for questions. The patient consents to medication trial.              -- Metabolic profile and EKG monitoring obtained while on an atypical antipsychotic  BMI: 24.21 TSH: 2.602 Lipid Panel: LDL 113 HbgA1c: 5.0% QTc: 426, and 498 on repeat             -- Encouraged patient to participate in unit milieu and in scheduled group therapies    3. Medical Issues Being  Addressed: #Hyperlipidemia -- Continue home Rosuvastatin 5 mg 3x weekly.   #Urinary tract infection Urine culture with greater than 100,000 colonies of E. coli.  Susceptibilities pending -Continue Pyridium 200 mg times total of 2 days (last dose 10/13 a.m.) - Start antibiotics once susceptibilities result   4. Discharge Planning:              -- Social work and case management to assist with discharge planning and identification of hospital follow-up needs prior to discharge             -- Estimated LOS: 5-7 days             -- Discharge Concerns: Need to establish a safety plan; Medication compliance and effectiveness             -- Discharge Goals: Return home with outpatient referrals for mental health follow-up including medication management/psychotherapy     [name], MD 03/01/2022, 3:14 PM

## 2022-03-01 NOTE — Progress Notes (Signed)
The patient rated her day as a 5 out of 10 since she is still a patient in the hospital, but she is not giving up. Her coping skill is to use prayer.

## 2022-03-02 DIAGNOSIS — F32A Depression, unspecified: Secondary | ICD-10-CM

## 2022-03-02 LAB — URINE CULTURE: Culture: >=100000 — AB

## 2022-03-02 MED ORDER — NITROFURANTOIN MONOHYD MACRO 100 MG PO CAPS
100.0000 mg | ORAL_CAPSULE | Freq: Every day | ORAL | Status: DC
  Administered 2022-03-02 – 2022-03-03 (×2): 100 mg via ORAL
  Filled 2022-03-02 (×4): qty 1

## 2022-03-02 MED ORDER — ESCITALOPRAM OXALATE 20 MG PO TABS
20.0000 mg | ORAL_TABLET | Freq: Every day | ORAL | Status: DC
  Administered 2022-03-03 – 2022-03-04 (×2): 20 mg via ORAL
  Filled 2022-03-02 (×4): qty 1

## 2022-03-02 NOTE — Progress Notes (Addendum)
Pt denied SI/HI/AVH this morning. Pt reports her anxiety to be a 2/10. Pt has been calm and cooperative throughout shift. Pt seen interacting on unit throughout the day. RN provided support and encouragement to patient. Pt given scheduled medications as prescribed. Q15 min checks verified for safety. Pt is safe on the unit.    03/02/22 1207  Psych Admission Type (Psych Patients Only)  Admission Status Voluntary  Psychosocial Assessment  Patient Complaints Anxiety  Eye Contact Fair  Facial Expression Anxious;Sad  Affect Anxious;Sad  Speech Logical/coherent  Interaction Assertive  Motor Activity Other (Comment) (WDL)  Appearance/Hygiene Unremarkable  Behavior Characteristics Cooperative;Appropriate to situation  Mood Anxious;Depressed;Pleasant  Thought Process  Coherency WDL  Content WDL  Delusions None reported or observed  Perception WDL  Hallucination None reported or observed  Judgment Impaired  Confusion WDL  Danger to Self  Current suicidal ideation? Denies  Danger to Others  Danger to Others None reported or observed

## 2022-03-02 NOTE — Progress Notes (Signed)
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   24 Hour Chart Review: Patient has been compliant with all scheduled medications, and has not required behavioral PRNs.  She attended groups appropriately last night.  Yesterday's Plan per Psychiatry Team: -- Continue Lexapro 10 mg -- Continue home Buspar 5 mg BID -- Melatonin 5 mg qHS for insomnia --Previously discontinue home Abilify as it may be contributing to patient's flat affect.  On Evaluation Today: Patient is less blunted in affect, and still appears somewhat anxious, but less than previous days.  Today, patient reports poor sleep, but is unable to identify a trigger.  She does feel unwell overall, with malodorous urine and tachycardia, but she denies previously reported urinary symptoms including urgency, frequency, and dysuria.  As well, she denies further diarrheal episodes.  She denies other somatic symptoms.  Her appetite continues to improve.  She otherwise reports mild improvements to her mood today and denies SI (both active and passive), HI, AVH, paranoia, and does not voice delusions.  She denies adverse effects of medications.  Her sensitivities resulted today, and we discussed options for treatment including maintenance Macrobid versus methenamine, and patient opts for the former.   Principal Problem: MDD (major depressive disorder) Diagnosis: Principal Problem:   MDD (major depressive disorder) Active Problems:   Suicide attempt by other psychotropic drug overdose ([place])   Generalized anxiety disorder   Total Time spent with patient: 30 minutes  Past Psychiatric History: See H&P  Past Medical History:  Past  Medical History:  Diagnosis Date   Anxiety    Dysplastic nevus 12/24/2019   R upper back paraspinal - moderate   Dysplastic nevus 12/24/2019   R to mid upper back 3.0 cm lat to spine above braline - mild   History of migraine headaches    Posterior vitreous detachment of left eye 12/14/2019   Vitreous membranes and strands, left 04/20/2020   Vitrectomy left eye 04-27-20    Past Surgical History:  Procedure Laterality Date   CHOLECYSTECTOMY OPEN     LASIK     LEEP     Family History:  Family History  Problem Relation Age of Onset   Cancer Mother    Hypertension Father    Hyperlipidemia Father    Cancer Maternal Grandmother    Family Psychiatric  History: See H&P Social History:  Social History   Substance and Sexual Activity  Alcohol Use No     Social History   Substance and Sexual Activity  Drug Use No    Social History   Socioeconomic History   Marital status: Married    Spouse name: Not on file   Number of children: 2   Years of education: Not on file   Highest education level: Not on file  Occupational History   Not on file  Tobacco Use   Smoking status: Former    Types: Cigarettes    Quit date: 06/07/2011    Years since quitting: 10.7    Passive exposure: Past   Smokeless tobacco: Never   Tobacco comments:    No smoking, does not need nicotine patch or gum  Vaping Use   Vaping Use: Never used  Substance and Sexual Activity   Alcohol  use: No   Drug use: No   Sexual activity: Yes    Birth control/protection: None  Other Topics Concern   Not on file  Social History Narrative   Not on file   Social Determinants of Health   Financial Resource Strain: Not on file  Food Insecurity: No Food Insecurity (02/26/2022)   Hunger Vital Sign    Worried About Running Out of Food in the Last Year: Never true    Ran Out of Food in the Last Year: Never true  Transportation Needs: No Transportation Needs (02/26/2022)   PRAPARE - Radiographer, therapeutic (Medical): No    Lack of Transportation (Non-Medical): No  Physical Activity: Not on file  Stress: Not on file  Social Connections: Not on file   Additional Social History:                         Sleep: Poor  Appetite:  Fair  Current Medications: Current Facility-Administered Medications  Medication Dose Route Frequency Provider Last Rate Last Admin   acetaminophen (TYLENOL) tablet 650 mg  650 mg Oral Q6H PRN Vesta Mixer, NP       alum & mag hydroxide-simeth (MAALOX/MYLANTA) 200-200-20 MG/5ML suspension 30 mL  30 mL Oral Q4H PRN Vesta Mixer, NP       busPIRone (BUSPAR) tablet 5 mg  5 mg Oral BID Massengill, Ovid Curd, MD   5 mg at 03/02/22 0758   escitalopram (LEXAPRO) tablet 10 mg  10 mg Oral Daily Massengill, Ovid Curd, MD   10 mg at 03/02/22 0758   hydrOXYzine (ATARAX) tablet 10 mg  10 mg Oral TID PRN [name], MD       loperamide (IMODIUM) capsule 4 mg  4 mg Oral PRN Massengill, Ovid Curd, MD   4 mg at 02/28/22 1134   melatonin tablet 5 mg  5 mg Oral QHS Massengill, Ovid Curd, MD   5 mg at 03/01/22 2057   nitrofurantoin (macrocrystal-monohydrate) (MACROBID) capsule 100 mg  100 mg Oral QHS [name], MD       rosuvastatin (CRESTOR) tablet 5 mg  5 mg Oral Q M,W,F [name], MD   5 mg at 03/02/22 1001   traZODone (DESYREL) tablet 50 mg  50 mg Oral QHS Massengill, Ovid Curd, MD   50 mg at 03/01/22 2056    Lab Results:  No results found for this or any previous visit (from the past 48 hour(s)).   Blood Alcohol level:  Lab Results  Component Value Date   ETH <10 02/24/2022   ETH <10 92/03/9416    Metabolic Disorder Labs: Lab Results  Component Value Date   HGBA1C 5.0 02/28/2022   MPG 96.8 02/28/2022   MPG 91.06 09/19/2021   No results found for: "PROLACTIN" Lab Results  Component Value Date   CHOL 186 02/28/2022   TRIG 128 02/28/2022   HDL 47 02/28/2022   CHOLHDL 4.0 02/28/2022   VLDL 26 02/28/2022   LDLCALC 113 (H)  02/28/2022   LDLCALC 102 (H) 06/22/2021    Physical Findings:  Musculoskeletal: Strength & Muscle Tone: within normal limits Gait & Station: normal although slowed Patient leans: N/A  Psychiatric Specialty Exam:  Presentation  General Appearance:  Appropriate for Environment; Casual   Eye Contact: Fair Improved  Speech: Clear and Coherent; Normal Rate   Speech Volume: Normal   Handedness: Right    Mood and Affect  Mood: -- (Less depressed, less anxious)   Affect: Blunt  But mildly less today   Thought Process  Thought Processes: Coherent; Goal Directed   Descriptions of Associations:Intact   Orientation:Full (Time, Place and Person)   Thought Content:WDL   History of Schizophrenia/Schizoaffective disorder: Denies  Duration of Psychotic Symptoms:Less than six months   Hallucinations:Hallucinations: None  Ideas of Reference:None   Suicidal Thoughts:Suicidal Thoughts: No  Homicidal Thoughts:Homicidal Thoughts: No   Sensorium  Memory: Immediate Good; Recent Good   Judgment: Fair   Insight: Fair    Community education officer  Concentration: Good   Attention Span: Good   Recall: Good   Fund of Knowledge: Good   Language: Good    Psychomotor Activity  Psychomotor Activity: Psychomotor Activity: Psychomotor Retardation (But less)   Assets  Assets: Communication Skills; Desire for Improvement; Housing; Intimacy; Leisure Time; Physical Health; Social Support; Catering manager; Vocational/Educational    Sleep  Sleep: Sleep: Poor    Physical Exam: Physical Exam Vitals reviewed.  Constitutional:      General: She is not in acute distress. HENT:     Head: Normocephalic and atraumatic.     Mouth/Throat:     Mouth: Mucous membranes are moist.     Pharynx: Oropharynx is clear.  Pulmonary:     Effort: Pulmonary effort is normal.  Skin:    General: Skin is warm and dry.  Neurological:      General: No focal deficit present.     Mental Status: She is alert and oriented to person, place, and time.    Review of Systems  Constitutional:  Positive for weight loss. Negative for chills and fever.       4 to 5 pounds over the past 4 weeks  Respiratory:  Negative for shortness of breath.   Cardiovascular:  Negative for chest pain and palpitations.  Gastrointestinal:  Negative for abdominal pain, constipation, diarrhea, nausea and vomiting.  Genitourinary:  Negative for dysuria, frequency, hematuria and urgency.       Malodorous urine  Musculoskeletal: Negative.   Neurological:  Negative for dizziness, weakness and headaches.   Blood pressure 110/72, pulse (!) 123, temperature 98.6 F (37 C), temperature source Oral, resp. rate 18, height '5\' 6"'$  (1.676 m), weight 68 kg, SpO2 94 %. Body mass index is 24.21 kg/m.   Treatment Plan Summary: ASSESSMENT: Principal Problem:   MDD (major depressive disorder) Active Problems:   Suicide attempt by other psychotropic drug overdose ([place])   Generalized anxiety disorder   Patient is a 56 year old female who presented with flat affect which has improved with discontinuation of Abilify as well as symptom management of urinary tract infection.  We will continue to monitor as UTI is treated.  Patient requires continued inpatient admission for crisis stabilization and medication optimization.   PLAN: Safety and Monitoring: voluntarily admission to inpatient psychiatric unit for safety, stabilization and treatment; was initially under IVC, but rescinded Daily contact with patient to assess and evaluate symptoms and progress in treatment Patient's case to be discussed in multi-disciplinary team meeting Observation Level : q15 minute checks Vital signs: q12 hours Precautions: suicide, elopement, and assault   2. Psychiatric Diagnoses and Treatment #MDD- recurrent/severe/without psychotic features #GAD -- Continue Lexapro 10 mg for depression  and anxiety; will increase to previously prescribed 20 mg tomorrow -- Continue home Buspar 5 mg BID for anxiety -- Melatonin 5 mg qHS for insomnia -Continue trazodone 50 mg nightly for insomnia. -- Previously discontinued home Abilify as it may be contributing to patient's flat affect.   PRN: Tylenol, Maalox, and hydroxyzine  10 mg TID PRN anxiety/insomnia   -- The risks/benefits/side-effects/alternatives to this medication were discussed in detail with the patient and time was given for questions. The patient consents to medication trial.              -- Metabolic profile and EKG monitoring obtained while on an atypical antipsychotic  BMI: 24.21 TSH: 2.602 Lipid Panel: LDL 113 HbgA1c: 5.0% QTc: 426, and 498 on repeat             -- Encouraged patient to participate in unit milieu and in scheduled group therapies    3. Medical Issues Being Addressed: #Hyperlipidemia -- Continue home Rosuvastatin 5 mg 3x weekly.   # Recurrent simple cystitis Urine culture with greater than 100,000 colonies of E. coli, less than 2 weeks after completion of treatment.  Susceptibilities resulted - Completed Pyridium 200 mg times total of 2 days (last dose 10/13 a.m.) - Start maintenance antibiotics with Macrobid 100 mg nightly   4. Discharge Planning:              -- Social work and case management to assist with discharge planning and identification of hospital follow-up needs prior to discharge             -- Estimated date of discharge: 10/15             -- Discharge Concerns: Need to establish a safety plan; Medication compliance and effectiveness             -- Discharge Goals: Return home with outpatient referrals for mental health follow-up including medication management/psychotherapy     [name], MD 03/02/2022, 2:27 PM

## 2022-03-02 NOTE — Progress Notes (Signed)
Adult Psychoeducational Group Note  Date:  03/02/2022 Time:  9:27 PM  Group Topic/Focus:  AA Group  Participation Level:  Active  Participation Quality:  Appropriate  Affect:  Appropriate  Cognitive:  Appropriate  Insight: Appropriate  Engagement in Group:  Engaged  Modes of Intervention:  Discussion and Support  Additional Comments:   Pt attended and was attentive during the [place] group.  Christine Bishop 03/02/2022, 9:27 PM

## 2022-03-02 NOTE — Group Note (Signed)
Recreation Therapy Group Note   Group Topic:Team Building  Group Date: 03/02/2022 Start Time: 0930 End Time: 0955 Facilitators: Ercole Georg-McCall, LRT,CTRS Location: [address] Dayroom   Goal Area(s) Addresses:  Patient will effectively work with peer towards shared goal.  Patient will identify skills used to make activity successful.  Patient will identify how skills used during activity can be applied to reach post d/c goals.   Group Description: [employer]. In teams of 5-6, patients were given 12 craft pipe cleaners. Using the materials provided, patients were instructed to compete again the opposing team(s) to build the tallest free-standing structure from floor level. The activity was timed; difficulty increased by Probation officer as Pharmacist, hospital continued.  Systematically resources were removed with additional directions for example, placing one arm behind their back, working in silence, and shape stipulations. LRT facilitated post-activity discussion reviewing team processes and necessary communication skills involved in completion. Patients were encouraged to reflect how the skills utilized, or not utilized, in this activity can be incorporated to positively impact support systems post discharge.   Affect/Mood: Appropriate   Participation Level: Engaged   Participation Quality: Independent   Behavior: Appropriate   Speech/Thought Process: Focused   Insight: Good   Judgement: Good   Modes of Intervention: STEM Activity   Patient Response to Interventions:  Engaged   Education Outcome:  Acknowledges education and In group clarification offered    Clinical Observations/Individualized Feedback: Pt engaged and worked well with peers in completing activity.   Plan: Continue to engage patient in RT group sessions 2-3x/week.   Syanne Looney-McCall, LRT,CTRS 03/02/2022 10:51 AM

## 2022-03-02 NOTE — Group Note (Signed)
[place] LCSW Group Therapy Note   Group Date: 03/02/2022 Start Time: 1300 End Time: 1400   Type of Therapy/Topic:  Group Therapy:  Emotion Regulation  Participation Level:  Minimal   Mood: Euthymic   Description of Group:    The purpose of this group is to assist patients in learning to regulate negative emotions and experience positive emotions. Patients will be guided to discuss ways in which they have been vulnerable to their negative emotions. These vulnerabilities will be juxtaposed with experiences of positive emotions or situations, and patients challenged to use positive emotions to combat negative ones. Special emphasis will be placed on coping with negative emotions in conflict situations, and patients will process healthy conflict resolution skills.  Therapeutic Goals: Patient will identify two positive emotions or experiences to reflect on in order to balance out negative emotions:  Patient will label two or more emotions that they find the most difficult to experience:  Patient will be able to demonstrate positive conflict resolution skills through discussion or role plays:   Summary of Patient Progress: Patient was present for the entirety of group session. Patient participated in opening and closing remarks. However, patient did not contribute at all to the topic of discussion despite encouraged participation.     Therapeutic Modalities:   Cognitive Behavioral Therapy Feelings Identification Dialectical Behavioral Therapy   Durenda Hurt, [place]

## 2022-03-02 NOTE — B[facility] Group Notes (Signed)
Adult Psychoeducational Group Note  Date:  03/02/2022 Time:  2:40 PM  Group Topic/Focus:  Wellness Toolbox:   The focus of this group is to discuss various aspects of wellness, balancing those aspects and exploring ways to increase the ability to experience wellness.  Patients will create a wellness toolbox for use upon discharge.  Participation Level:  Active  Participation Quality:  Attentive  Affect:  Appropriate  Cognitive:  Appropriate  Insight: Appropriate  Engagement in Group:  Engaged  Modes of Intervention:  Activity  Additional Comments:  Patient attended and participated in the therapeutic group activity.  Christine Bishop 03/02/2022, 2:40 PM

## 2022-03-02 NOTE — Group Note (Signed)
Date:  03/02/2022 Time:  10:14 AM  Group Topic/Focus:  Goals Group:   The focus of this group is to help patients establish daily goals to achieve during treatment and discuss how the patient can incorporate goal setting into their daily lives to aide in recovery.    Participation Level:  Minimal  Participation Quality:  Inattentive  Affect:  Blunted  Cognitive:  Appropriate  Insight: None  Engagement in Group:  Lacking  Modes of Intervention:  Discussion  Additional Comments:     Jerrye Beavers 03/02/2022, 10:14 AM

## 2022-03-03 NOTE — Progress Notes (Signed)
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   24 Hour Chart Review: Patient has been compliant with all scheduled medications, and has not required behavioral PRNs.    On Evaluation Today: The patient reports frustration with poor sleep.  She states the every 15 minute checks bother her and the noise on the unit.  She expects to be able to sleep normally when she returns home.  She reports diminished appetite but says that she is still eating relatively well.  She denies experiencing medication side effects denies experiencing urinary symptoms after the starting of Macrobid.  She appears future oriented and states she is looking forward to being with her husband and daughter at home.  She inquires about her follow-up and whether or not it has been arranged.  The patient is informed that it has been.  She denies suicidal or homicidal thoughts.  She denies auditory or visual hallucinations.   Principal Problem: MDD (major depressive disorder) Diagnosis: Principal Problem:   MDD (major depressive disorder) Active Problems:   Suicide attempt by other psychotropic drug overdose ([place])   Generalized anxiety disorder   Total Time spent with patient: 30 minutes  Past Psychiatric History: See H&P  Past Medical History:  Past Medical History:  Diagnosis Date   Anxiety    Dysplastic nevus 12/24/2019   R upper back paraspinal - moderate   Dysplastic nevus 12/24/2019   R to mid upper back 3.0 cm lat to spine above braline - mild   History of migraine headaches    Posterior vitreous detachment of left eye 12/14/2019   Vitreous membranes and strands, left 04/20/2020   Vitrectomy left  eye 04-27-20    Past Surgical History:  Procedure Laterality Date   CHOLECYSTECTOMY OPEN     LASIK     LEEP     Family History:  Family History  Problem Relation Age of Onset   Cancer Mother    Hypertension Father    Hyperlipidemia Father    Cancer Maternal Grandmother    Family Psychiatric  History: See H&P Social History:  Social History   Substance and Sexual Activity  Alcohol Use No     Social History   Substance and Sexual Activity  Drug Use No    Social History   Socioeconomic History   Marital status: Married    Spouse name: Not on file   Number of children: 2   Years of education: Not on file   Highest education level: Not on file  Occupational History   Not on file  Tobacco Use   Smoking status: Former    Types: Cigarettes    Quit date: 06/07/2011    Years since quitting: 10.7    Passive exposure: Past   Smokeless tobacco: Never   Tobacco comments:    No smoking, does not need nicotine patch or gum  Vaping Use   Vaping Use: Never used  Substance and Sexual Activity   Alcohol use: No   Drug use: No   Sexual activity: Yes    Birth control/protection: None  Other Topics Concern   Not on file  Social History Narrative   Not on file   Social Determinants of Radio broadcast assistant  Strain: Not on file  Food Insecurity: No Food Insecurity (02/26/2022)   Hunger Vital Sign    Worried About Running Out of Food in the Last Year: Never true    Ran Out of Food in the Last Year: Never true  Transportation Needs: No Transportation Needs (02/26/2022)   PRAPARE - Hydrologist (Medical): No    Lack of Transportation (Non-Medical): No  Physical Activity: Not on file  Stress: Not on file  Social Connections: Not on file   Additional Social History:                         Sleep: Poor  Appetite:  Fair  Current Medications: Current Facility-Administered Medications  Medication Dose Route Frequency Provider  Last Rate Last Admin   acetaminophen (TYLENOL) tablet 650 mg  650 mg Oral Q6H PRN Vesta Mixer, NP       alum & mag hydroxide-simeth (MAALOX/MYLANTA) 200-200-20 MG/5ML suspension 30 mL  30 mL Oral Q4H PRN Vesta Mixer, NP       busPIRone (BUSPAR) tablet 5 mg  5 mg Oral BID Massengill, Nathan, MD   5 mg at 03/03/22 0800   escitalopram (LEXAPRO) tablet 20 mg  20 mg Oral Daily [name], MD   20 mg at 03/03/22 0800   hydrOXYzine (ATARAX) tablet 10 mg  10 mg Oral TID PRN [name], MD       loperamide (IMODIUM) capsule 4 mg  4 mg Oral PRN Massengill, Ovid Curd, MD   4 mg at 02/28/22 1134   melatonin tablet 5 mg  5 mg Oral QHS Massengill, Ovid Curd, MD   5 mg at 03/02/22 2114   nitrofurantoin (macrocrystal-monohydrate) (MACROBID) capsule 100 mg  100 mg Oral QHS [name], MD   100 mg at 03/02/22 2114   rosuvastatin (CRESTOR) tablet 5 mg  5 mg Oral Q M,W,F [name], MD   5 mg at 03/02/22 1001   traZODone (DESYREL) tablet 50 mg  50 mg Oral QHS Massengill, Ovid Curd, MD   50 mg at 03/02/22 2114    Lab Results:  No results found for this or any previous visit (from the past 48 hour(s)).   Blood Alcohol level:  Lab Results  Component Value Date   ETH <10 02/24/2022   ETH <10 76/28/3151    Metabolic Disorder Labs: Lab Results  Component Value Date   HGBA1C 5.0 02/28/2022   MPG 96.8 02/28/2022   MPG 91.06 09/19/2021   No results found for: "PROLACTIN" Lab Results  Component Value Date   CHOL 186 02/28/2022   TRIG 128 02/28/2022   HDL 47 02/28/2022   CHOLHDL 4.0 02/28/2022   VLDL 26 02/28/2022   LDLCALC 113 (H) 02/28/2022   LDLCALC 102 (H) 06/22/2021    Physical Findings:  Musculoskeletal: Strength & Muscle Tone: within normal limits Gait & Station: normal although slowed Patient leans: N/A  Psychiatric Specialty Exam:  Presentation  General Appearance:  Appropriate for Environment; Casual   Eye Contact: Fair Improved  Speech: Clear and  Coherent; Normal Rate   Speech Volume: Normal   Handedness: Right    Mood and Affect  Mood: euthymic   Affect: congruent   Thought Process  Thought Processes: Coherent; Goal Directed   Descriptions of Associations:Intact   Orientation:Full (Time, Place and Person)   Thought Content:WDL   History of Schizophrenia/Schizoaffective disorder: Denies  Duration of Psychotic Symptoms:Less than six months   Hallucinations: none  Ideas  of Reference:None   Suicidal Thoughts: denies  Homicidal Thoughts: denies   Sensorium  Memory: Immediate Good; Recent Good   Judgment: Fair   Insight: Fair    Community education officer  Concentration: Good   Attention Span: Good   Recall: Good   Fund of Knowledge: Good   Language: Good    Psychomotor Activity  Psychomotor Activity: normal   Assets  Assets: Communication Skills; Desire for Improvement; Housing; Intimacy; Leisure Time; Physical Health; Social Support; Catering manager; Vocational/Educational    Sleep  Sleep: Sleep: Poor    Physical Exam Constitutional:      Appearance: the patient is not toxic-appearing.  Pulmonary:     Effort: Pulmonary effort is normal.  Neurological:     General: No focal deficit present.     Mental Status: the patient is alert and oriented to person, place, and time.   Review of Systems  Respiratory:  Negative for shortness of breath.   Cardiovascular:  Negative for chest pain.  Gastrointestinal:  Negative for abdominal pain, constipation, diarrhea, nausea and vomiting.  Neurological:  Negative for headaches.     Blood pressure (!) 97/57, pulse (!) 118, temperature 98.5 F (36.9 C), temperature source Oral, resp. rate 18, height '5\' 6"'$  (1.676 m), weight 68 kg, SpO2 93 %. Body mass index is 24.21 kg/m.   Treatment Plan Summary: ASSESSMENT: Principal Problem:   MDD (major depressive disorder) Active Problems:   Suicide attempt  by other psychotropic drug overdose ([place])   Generalized anxiety disorder   Patient is a 56 year old female who presented with flat affect which has improved with discontinuation of Abilify as well as symptom management of urinary tract infection.  We will continue to monitor as UTI is treated.  Patient requires continued inpatient admission for crisis stabilization and medication optimization.   PLAN: Safety and Monitoring: voluntarily admission to inpatient psychiatric unit for safety, stabilization and treatment; was initially under IVC, but rescinded Daily contact with patient to assess and evaluate symptoms and progress in treatment Patient's case to be discussed in multi-disciplinary team meeting Observation Level : q15 minute checks Vital signs: q12 hours Precautions: suicide, elopement, and assault   2. Psychiatric Diagnoses and Treatment #MDD- recurrent/severe/without psychotic features #GAD -- Continue Lexapro 20 mg daily for depression -- Continue home Buspar 5 mg BID for anxiety -- Melatonin 5 mg qHS for insomnia -Continue trazodone 50 mg nightly for insomnia. -- Previously discontinued home Abilify as it may be contributing to patient's flat affect.   PRN: Tylenol, Maalox, and hydroxyzine 10 mg TID PRN anxiety/insomnia   -- The risks/benefits/side-effects/alternatives to this medication were discussed in detail with the patient and time was given for questions. The patient consents to medication trial.              -- Metabolic profile and EKG monitoring obtained while on an atypical antipsychotic  BMI: 24.21 TSH: 2.602 Lipid Panel: LDL 113 HbgA1c: 5.0% QTc: 426, and 498 on repeat             -- Encouraged patient to participate in unit milieu and in scheduled group therapies    3. Medical Issues Being Addressed: #Hyperlipidemia -- Continue home Rosuvastatin 5 mg 3x weekly.   # Recurrent simple cystitis Urine culture with greater than 100,000 colonies of E. coli,  less than 2 weeks after completion of treatment.  Susceptibilities resulted - Completed Pyridium 200 mg times total of 2 days (last dose 10/13 a.m.) - Start maintenance antibiotics with Macrobid 100 mg nightly  4. Discharge Planning:              -- Social work and case management to assist with discharge planning and identification of hospital follow-up needs prior to discharge             -- Estimated date of discharge: 10/15             -- Discharge Concerns: Need to establish a safety plan; Medication compliance and effectiveness             -- Discharge Goals: Return home with outpatient referrals for mental health follow-up including medication management/psychotherapy     Corky Sox, MD 03/03/2022, 1:21 PM

## 2022-03-03 NOTE — Progress Notes (Signed)
Adult Psychoeducational Group Note  Date:  03/03/2022 Time:  2:47 PM  Group Topic/Focus:  Self Care:   The focus of this group is to help patients understand the importance of self-care in order to improve or restore emotional, physical, spiritual, interpersonal, and financial health.  Participation Level:  Active  Participation Quality:  Appropriate  Affect:  Appropriate  Cognitive:  Appropriate  Insight: Appropriate  Engagement in Group:  Engaged  Modes of Intervention:  Discussion  Additional Comments:  Patient attended afternoon therapeutic group and participated.   Lisbet Busker W Hamna Asa 47/20/7218, 2:47 PM

## 2022-03-03 NOTE — Plan of Care (Signed)
  Problem: Education: Goal: Ability to state activities that reduce stress will improve Outcome: Progressing   Problem: Education: Goal: Utilization of techniques to improve thought processes will improve Outcome: Progressing Goal: Knowledge of the prescribed therapeutic regimen will improve Outcome: Progressing   Problem: Education: Goal: Knowledge of [place] [facility] Education information/materials will improve Outcome: Progressing   Problem: Education: Goal: Ability to make informed decisions regarding treatment will improve Outcome: Progressing

## 2022-03-03 NOTE — Group Note (Signed)
LCSW Group Therapy Note  03/03/2022      Topic:  Anger Healthy and Unhealthy Coping Skills  Participation Level:  Minimal engagement verbally, but listened attentively  Description of Group:   In this group, patients identified their own common triggers and typical reactions then analyzed how these reactions are possibly beneficial and possibly unhelpful.  Focus was placed on examining whether typical coping skills are healthy or unhealthy.  Therapeutic Goals: Patients will share situations that commonly incite their anger and how they typically respond Patients will identify how their coping skills work for them and/or against them Patients will explore possible alternative coping skills Patients will learn that anger itself is normal and that healthier reactions can assist with resolving conflict rather than worsening situations  Summary of Patient Progress:  The patient shared that her frequent cause of anger is people going back on their word.  Choice of coping skill is often to "fuss, bitch or internalize by worrying," which was identified as a unhealthy choice because it does not help the problem go away.  Therapeutic Modalities:   Cognitive Behavioral Therapy Processing  Maretta Los, LCSW

## 2022-03-03 NOTE — Progress Notes (Addendum)
D- Patient alert and oriented. Denies SI, HI, AVH, and pain. Patient rates anxiety and depression each at a 2/10.  A- Scheduled medications administered to patient, per MD orders. Support and encouragement provided.  Routine safety checks conducted every 15 minutes.  Patient informed to notify staff with problems or concerns.  R- No adverse drug reactions noted. Patient contracts for safety at this time. Patient compliant with medications and treatment plan. Patient receptive, calm, and cooperative. Patient interacts well with others on the unit.  Patient remains safe at this time.

## 2022-03-03 NOTE — Progress Notes (Signed)
   03/03/22 0500  Sleep  Number of Hours 5

## 2022-03-03 NOTE — Progress Notes (Signed)
   03/03/22 0800  Psych Admission Type (Psych Patients Only)  Admission Status Voluntary  Psychosocial Assessment  Patient Complaints Sleep disturbance  Eye Contact Brief  Facial Expression Flat  Affect Sad  Speech Logical/coherent  Interaction Cautious  Motor Activity Slow  Appearance/Hygiene Unremarkable  Behavior Characteristics Cooperative  Mood Depressed  Thought Process  Coherency WDL  Content WDL  Delusions WDL  Perception WDL  Hallucination None reported or observed  Judgment WDL  Confusion WDL  Danger to Self  Current suicidal ideation? Denies  Danger to Others  Danger to Others None reported or observed

## 2022-03-03 NOTE — Progress Notes (Signed)
   03/03/22 2213  Psych Admission Type (Psych Patients Only)  Admission Status Voluntary  Psychosocial Assessment  Patient Complaints None  Eye Contact Brief  Affect Appropriate to circumstance  Speech Logical/coherent  Interaction Assertive  Motor Activity Other (Comment) (WDL)  Appearance/Hygiene Unremarkable  Behavior Characteristics Appropriate to situation  Mood Depressed  Thought Process  Coherency WDL  Content WDL  Delusions None reported or observed  Perception WDL  Hallucination None reported or observed  Judgment WDL  Confusion None  Danger to Self  Current suicidal ideation? Denies  Danger to Others  Danger to Others None reported or observed  Danger to Others Abnormal  Harmful Behavior to others No threats or harm toward other people  Destructive Behavior No threats or harm toward property

## 2022-03-04 DIAGNOSIS — F331 Major depressive disorder, recurrent, moderate: Secondary | ICD-10-CM

## 2022-03-04 MED ORDER — ESCITALOPRAM OXALATE 20 MG PO TABS: 20.0000 mg | ORAL_TABLET | Freq: Every day | ORAL | 0 refills | Status: DC

## 2022-03-04 MED ORDER — NITROFURANTOIN MONOHYD MACRO 100 MG PO CAPS: 100.0000 mg | ORAL_CAPSULE | Freq: Every day | ORAL | 0 refills | Status: DC

## 2022-03-04 NOTE — [facility] Group Notes (Signed)
LCSW Group Therapy Note  03/04/2022      Type of Therapy and Topic:  Group Therapy: Gratitude  Participation Level:  Active   Description of Group:   In this group, patients shared and discussed the importance of acknowledging the elements in their lives for which they are grateful and how this can positively impact their mood.  The group discussed how bringing the positive elements of their lives to the forefront of their minds can help with recovery from any illness, physical or mental.  An exercise was done as a group in which a list was made of gratitude items in order to encourage participants to consider other potential positives in their lives.  Therapeutic Goals: Patients will identify one or more item for which they are grateful in each of 6 categories:  people, experience, thing, place, skill, and other. Patients will discuss how it is possible to seek out gratitude in even bad situations. Patients will explore other possible items of gratitude that they could remember.   Summary of Patient Progress:  The patient shared that her immediate thought when asked what she is grateful for was to think that she was grateful "to be here, for life itself."    She did not talk a  great deal but was interested in other's shares.  Therapeutic Modalities:   Solution-Focused Therapy Activity  Berlin Hun Grossman-Orr, LCSW .

## 2022-03-04 NOTE — Progress Notes (Addendum)
  [facility] Adult Case Management Discharge Plan :  Will you be returning to the same living situation after discharge:  Yes,  with spouse At discharge, do you have transportation home?: Yes,  husband Do you have the ability to pay for your medications: Yes,  has insurance and income  Release of information consent forms completed and emailed to Medical Records, then turned in to Medical Records by CSW.   Patient to Follow up at:  Follow-up Information     [facility]. Call.   Specialty: Behavioral Health Why: This agency will mail intake paperwork to the address that is on file with your account.  If you are interested in therapy services please fill out this paperwork and return it to the agency. Contact information: [place] [id number] [facility] Follow up on 04/02/2022.   Specialty: Behavioral Health Why: You have been scheduled for an intake assessment on 04/02/2022 at 10:00am for medication management services.  Please arrive at 9:45am to complete paperwork. Contact information: [place]. Call.   Why: You may contact this agency to establish therapy services. Contact information: [address] [place] [id number]        [facility] [place]. Call.   Why: You have been referred to the agency for therapy.  This agency will contact you by phone after discharge. Contact information: Address: [address], [place], [place] [address]  Phone: [phone] Fax: [phone]               [facility]. Call.   Why: Please contact your social worker if  you need help with FMLA paperwork and/or Short-Term Disability paperwork. Contact information: [facility] [place]  [address] Tel:  [phone]  Next level of care provider has access to [place] and Suicide Prevention discussed: Yes,  with husband     Has patient been referred to the Quitline?: N/A patient is not a smoker  Patient has been referred for addiction treatment: N/A  Maretta Los, LCSW 03/04/2022, 9:05 AM

## 2022-03-04 NOTE — Progress Notes (Signed)
Discharge Note:  Patient discharged home with family member.  Patient denied SI and HI. Denied A/V hallucinations. Suicide prevention information given and discussed with patient who stated they understood and had no questions. Patient stated they received all their belongings, clothing, toiletries, misc items, etc. Patient stated they appreciated all assistance received from [facility] staff. All required discharge information given to patient. 
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
## 2022-03-04 NOTE — BHH Suicide Risk Assessment (Signed)
[facility] Discharge Suicide Risk Assessment   Principal Problem: MDD (major depressive disorder) Discharge Diagnoses: Principal Problem:   MDD (major depressive disorder) Active Problems:   Suicide attempt by other psychotropic drug overdose ([place])   Generalized anxiety disorder   Total Time spent with patient: 15 minutes  During the patient's hospitalization, patient had extensive initial psychiatric evaluation, and follow-up psychiatric evaluations every day.  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   Patient's care was discussed during the interdisciplinary team meeting every day during the hospitalization.  The patient denied having side effects to prescribed psychiatric medication.  The patient reports their target psychiatric symptoms of depression and anxiety responded well to the psychiatric medications, and the patient reports overall benefit other psychiatric hospitalization. Supportive psychotherapy was provided to the patient. The patient also participated in regular group therapy while admitted.   Labs were reviewed with the patient, and abnormal results were discussed with the patient.  The patient denied having suicidal thoughts more than 48 hours prior to discharge.  Patient denies having homicidal thoughts.  Patient denies having auditory hallucinations.  Patient denies any visual hallucinations.  Patient denies having  paranoid thoughts.  The patient is able to verbalize their individual safety plan to this provider.  It is recommended to the patient to continue psychiatric medications as prescribed, after discharge from the hospital.    It is recommended to the patient to follow up with your outpatient psychiatric provider and PCP.  Discussed with the patient, the impact of alcohol, drugs, tobacco have been there overall psychiatric and medical wellbeing, and total abstinence from substance use was recommended the patient.    Musculoskeletal: Strength & Muscle Tone: within normal limits Gait & Station: normal Patient leans: N/A  Psychiatric Specialty Exam:   Presentation  General Appearance:  Appropriate for Environment; Casual     Eye Contact: Fair Improved   Speech: Clear and Coherent; Normal Rate     Speech Volume: Normal     Handedness: Right       Mood and Affect  Mood: euthymic     Affect: congruent     Thought Process  Thought Processes: Coherent; Goal Directed     Descriptions of Associations:Intact     Orientation:Full (Time, Place and Person)     Thought Content:WDL     History of Schizophrenia/Schizoaffective disorder: Denies   Duration of Psychotic Symptoms:Less than six months     Hallucinations: none   Ideas of Reference:None     Suicidal Thoughts: denies   Homicidal Thoughts: denies     Sensorium  Memory: Immediate Good; Recent Good     Judgment: Fair     Insight: Fair       Community education officer  Concentration: Good     Attention Span: Good     Recall: Good     Fund of Knowledge: Good     Language: Good       Psychomotor Activity  Psychomotor Activity: normal     Assets  Assets: Communication Skills; Desire for Improvement; Housing; Intimacy; Leisure  Time; Physical Health; Social Support; Catering manager; Vocational/Educational       Sleep  Sleep: Sleep: fair        Physical Exam Constitutional:      Appearance: the patient is not toxic-appearing.  Pulmonary:     Effort: Pulmonary effort is normal.  Neurological:     General: No focal deficit present.     Mental Status: the patient is alert and oriented to person, place, and time.    Review of Systems  Respiratory:  Negative for shortness of breath.   Cardiovascular:  Negative for chest pain.  Gastrointestinal:  Negative for abdominal pain, constipation, diarrhea, nausea and vomiting.  Neurological:  Negative for headaches.    Blood pressure 100/76, pulse (!) 129, temperature 98.7 F (37.1 C), temperature source Oral, resp. rate 18, height '5\' 6"'$  (1.676 m), weight 68 kg, SpO2 97 %. Body mass index is 24.21 kg/m.  Mental Status Per Nursing Assessment::   On Admission:  Suicidal ideation indicated by patient, Self-harm behaviors, Self-harm thoughts  Demographic Factors:  NA  Loss Factors: NA  Historical Factors: NA  Risk Reduction Factors:   Sense of responsibility to family, Employed, Living with another person, especially a relative, Positive social support, Positive therapeutic relationship, and Positive coping skills or problem solving skills  Continued Clinical Symptoms:  Depression:   Severe  Cognitive Features That Contribute To Risk:  None    Suicide Risk:  Mild: The patient presented with a suicide attempt via overdose on trazodone.  Throughout her stay at the behavioral hospital the patient has demonstrated good behavioral and emotional regulation.  She is consistently denied suicidal thoughts.  The patient has demonstrated good coping skills and shows that she is future oriented.  Safety planning has been performed and the patient is able to report what she would do in the case of a psychiatric emergency.   Follow-up Information     [place]. Call.   Specialty: Behavioral Health Why: This agency will mail intake paperwork to the address that is on  file with your account.  If you are interested in therapy services please fill out this paperwork and return it to the agency. Contact information: [place] [address] [facility] Follow up on 04/02/2022.   Specialty: Behavioral Health Why: You have been scheduled for an intake assessment on 04/02/2022 at 10:00am for medication management services.  Please arrive at 9:45am to complete paperwork. Contact information: [place]. Call.   Why: You may contact this agency to establish therapy services. Contact information: [place] [address] [place] [address]        [facility], [place]. Call.   Why: You have been referred to the agency for therapy.  This agency will contact you by phone after discharge. Contact information: Address: [address], [place], [place] [address]  Phone: [phone] Fax: [phone]                Plan Of Care/Follow-up recommendations:  Activity: as tolerated  Diet: heart healthy  Other: -Follow-up with your outpatient psychiatric provider -instructions on appointment date, time, and address (location) are provided to you in discharge paperwork.  -Take your psychiatric medications as prescribed at discharge - instructions are provided to you in the  discharge paperwork  -Follow-up with outpatient primary care doctor and other specialists -for management of preventative medicine and chronic medical disease, including: HLD.  -Recommend abstinence from alcohol, tobacco, and other illicit drug use at discharge.   -If your psychiatric symptoms recur, worsen, or if you have side effects to your psychiatric medications, call your outpatient psychiatric provider, 911, [phone] or go to the nearest  emergency department.    Corky Sox, MD 03/04/2022, 9:45 AM

## 2022-03-04 NOTE — [facility] Group Notes (Signed)
[facility] Group Notes:  (Nursing/MHT/Case Management/Adjunct)  Date:  03/04/2022  Time:  9:43 AM  Type of Therapy:   Discussion  Participation Level:  Active  Participation Quality:  Appropriate  Affect:  Appropriate  Cognitive:  Appropriate  Insight:  Appropriate  Engagement in Group:  Engaged and Supportive  Modes of Intervention:  Discussion, Orientation, and Socialization  Summary of Progress/Problems:  Christine Bishop 03/04/2022, 9:43 AM

## 2022-03-19 NOTE — Progress Notes (Unsigned)
Established patient visit   Patient: Christine Bishop   DOB: 16-Aug-1965   56 y.o. Female  MRN: [id number] Visit Date: 03/20/2022   No chief complaint on file.  Subjective    HPI  Hospital follow up -admitted 02/26/2022 and discharged 03/05/2022.  -Admitted to  hospital  via  [facility] from [facility] after a suicide attempt via overdose on  Trazodone.  She says that over the past 4 weeks, she has had decreased motivation but had not noticed that she was becoming more depressed. She reports that she was still working, so that masked her sx. She was unable to identify any particular stressors or triggers. However, when we discuss her sx of anxiety, she reports worrying about her job, money, the things that she has not yet done, and what is to come in the future. These worries do sometimes interfere with her work and with her interactions with others. Upon discharge, patient waws referred to -[facility] - initial appointment is 04/02/2022.  -counseling services for which she has to call and schedule appointment.  Patient does need to have FMLA paperwork completed and returned to employer.    Medications: Outpatient Medications Prior to Visit  Medication Sig   busPIRone (BUSPAR) 5 MG tablet Take 5 mg by mouth 2 (two) times daily.   escitalopram (LEXAPRO) 20 MG tablet Take 1 tablet (20 mg total) by mouth daily.   ketorolac (ACULAR) 0.5 % ophthalmic solution INSTILL 1 DROP INTO THE RIGHT EYE 4 TIMES A DAY AS DIRECTED (Patient taking differently: Place 1 drop into the right eye in the morning and at bedtime.)   Multiple Vitamins-Minerals (PRESERVISION AREDS PO) Take 1 tablet by mouth in the morning and at bedtime.   nitrofurantoin, macrocrystal-monohydrate, (MACROBID) 100 MG capsule Take 1 capsule (100 mg total) by mouth at bedtime.   rosuvastatin (CRESTOR) 5 MG tablet Take 5 mg by mouth daily. (Patient not taking: Reported on 02/25/2022)   traZODone (DESYREL) 50  MG tablet TAKE 1/2 TO 1 TABLET BY MOUTH AT BEDTIME AS NEEDED FOR SLEEP (Patient taking differently: Take 25-50 mg by mouth at bedtime as needed for sleep.)   No facility-administered medications prior to visit.    Review of Systems  {Labs (Optional):23779}   Objective    There were no vitals filed for this visit. There is no height or weight on file to calculate BMI.  BP Readings from Last 3 Encounters:  02/26/22 126/68  10/12/21 92/60  07/13/21 115/70    Wt Readings from Last 3 Encounters:  02/24/22 160 lb (72.6 kg)  10/12/21 176 lb 6.4 oz (80 kg)  07/13/21 173 lb 6.4 oz (78.7 kg)    Physical Exam  ***  No results found for any visits on 03/20/22.  Assessment & Plan     Problem List Items Addressed This Visit   None    No follow-ups on file.         Ronnell Freshwater, NP  [facility] at [facility] [phone] (phone) [phone] (fax)  [place]

## 2022-03-20 ENCOUNTER — Ambulatory Visit (INDEPENDENT_AMBULATORY_CARE_PROVIDER_SITE_OTHER): Payer: Federal, State, Local not specified - PPO | Admitting: Nurse Practitioner

## 2022-03-20 ENCOUNTER — Encounter: Payer: Self-pay | Admitting: Nurse Practitioner

## 2022-03-20 VITALS — BP 95/69 | HR 87 | Ht 66.0 in | Wt 149.1 lb

## 2022-03-20 DIAGNOSIS — T438X2D Poisoning by other psychotropic drugs, intentional self-harm, subsequent encounter: Secondary | ICD-10-CM | POA: Diagnosis not present

## 2022-03-20 DIAGNOSIS — R300 Dysuria: Secondary | ICD-10-CM | POA: Diagnosis not present

## 2022-03-20 DIAGNOSIS — F339 Major depressive disorder, recurrent, unspecified: Secondary | ICD-10-CM | POA: Diagnosis not present

## 2022-03-20 LAB — POCT URINALYSIS DIP (CLINITEK)
Blood, UA: NEGATIVE
Glucose, UA: NEGATIVE mg/dL
Leukocytes, UA: NEGATIVE
Nitrite, UA: NEGATIVE
POC PROTEIN,UA: 30 — AB
Spec Grav, UA: >=1.03 — AB (ref 1.010–1.025)
Urobilinogen, UA: 0.2 E.U./dL
pH, UA: 6 (ref 5.0–8.0)

## 2022-04-02 ENCOUNTER — Encounter (HOSPITAL_COMMUNITY): Payer: Self-pay | Admitting: Psychiatry

## 2022-04-02 ENCOUNTER — Ambulatory Visit (HOSPITAL_BASED_OUTPATIENT_CLINIC_OR_DEPARTMENT_OTHER): Payer: Federal, State, Local not specified - PPO | Admitting: Psychiatry

## 2022-04-02 VITALS — BP 129/77 | HR 69 | Temp 97.3°F | Ht 66.0 in | Wt 144.6 lb

## 2022-04-02 DIAGNOSIS — F321 Major depressive disorder, single episode, moderate: Secondary | ICD-10-CM | POA: Diagnosis not present

## 2022-04-02 DIAGNOSIS — F411 Generalized anxiety disorder: Secondary | ICD-10-CM | POA: Diagnosis not present

## 2022-04-02 MED ORDER — BUPROPION HCL ER (XL) 150 MG PO TB24: 150.0000 mg | ORAL_TABLET | ORAL | 0 refills | Status: DC

## 2022-04-02 MED ORDER — ESCITALOPRAM OXALATE 20 MG PO TABS: 20.0000 mg | ORAL_TABLET | Freq: Every day | ORAL | 1 refills | Status: DC

## 2022-04-02 MED ORDER — BUPROPION HCL ER (XL) 300 MG PO TB24: 300.0000 mg | ORAL_TABLET | ORAL | 0 refills | Status: DC
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
## 2022-04-05 DIAGNOSIS — F329 Major depressive disorder, single episode, unspecified: Secondary | ICD-10-CM | POA: Diagnosis not present

## 2022-04-05 DIAGNOSIS — N951 Menopausal and female climacteric states: Secondary | ICD-10-CM | POA: Diagnosis not present

## 2022-04-11 ENCOUNTER — Telehealth: Payer: Self-pay

## 2022-04-11 NOTE — Telephone Encounter (Signed)
I advised the patient husband that pw should be ready on Monday and we would call when ready.

## 2022-04-11 NOTE — Telephone Encounter (Signed)
Pt husband called to for an update on the status of the FMLA paperwork that was discuss at her appt on 03/20/22.  Please advise

## 2022-04-19 ENCOUNTER — Other Ambulatory Visit: Payer: Self-pay

## 2022-04-19 ENCOUNTER — Emergency Department (HOSPITAL_COMMUNITY)
Admission: EM | Admit: 2022-04-19 | Discharge: 2022-04-20 | Disposition: A | Discharge status: Other Acute Inpatient Hospital | Payer: Federal, State, Local not specified - PPO | Source: Home / Self Care | Attending: Emergency Medicine | Admitting: Emergency Medicine

## 2022-04-19 DIAGNOSIS — Z888 Allergy status to other drugs, medicaments and biological substances status: Secondary | ICD-10-CM | POA: Diagnosis not present

## 2022-04-19 DIAGNOSIS — F410 Panic disorder [episodic paroxysmal anxiety] without agoraphobia: Secondary | ICD-10-CM | POA: Insufficient documentation

## 2022-04-19 DIAGNOSIS — F332 Major depressive disorder, recurrent severe without psychotic features: Secondary | ICD-10-CM | POA: Diagnosis not present

## 2022-04-19 DIAGNOSIS — F322 Major depressive disorder, single episode, severe without psychotic features: Secondary | ICD-10-CM | POA: Diagnosis present

## 2022-04-19 DIAGNOSIS — F32A Depression, unspecified: Secondary | ICD-10-CM | POA: Diagnosis not present

## 2022-04-19 DIAGNOSIS — Z8249 Family history of ischemic heart disease and other diseases of the circulatory system: Secondary | ICD-10-CM | POA: Diagnosis not present

## 2022-04-19 DIAGNOSIS — Z882 Allergy status to sulfonamides status: Secondary | ICD-10-CM | POA: Diagnosis not present

## 2022-04-19 DIAGNOSIS — F411 Generalized anxiety disorder: Secondary | ICD-10-CM | POA: Insufficient documentation

## 2022-04-19 DIAGNOSIS — Z83438 Family history of other disorder of lipoprotein metabolism and other lipidemia: Secondary | ICD-10-CM | POA: Diagnosis not present

## 2022-04-19 DIAGNOSIS — F419 Anxiety disorder, unspecified: Secondary | ICD-10-CM

## 2022-04-19 DIAGNOSIS — Z1152 Encounter for screening for COVID-19: Secondary | ICD-10-CM | POA: Insufficient documentation

## 2022-04-19 DIAGNOSIS — R45851 Suicidal ideations: Secondary | ICD-10-CM | POA: Diagnosis not present

## 2022-04-19 DIAGNOSIS — F3164 Bipolar disorder, current episode mixed, severe, with psychotic features: Secondary | ICD-10-CM | POA: Diagnosis present

## 2022-04-19 DIAGNOSIS — R0902 Hypoxemia: Secondary | ICD-10-CM | POA: Diagnosis not present

## 2022-04-19 DIAGNOSIS — R000 Tachycardia, unspecified: Secondary | ICD-10-CM | POA: Diagnosis not present

## 2022-04-19 DIAGNOSIS — Z87891 Personal history of nicotine dependence: Secondary | ICD-10-CM | POA: Diagnosis not present

## 2022-04-19 DIAGNOSIS — R9431 Abnormal electrocardiogram [ECG] [EKG]: Secondary | ICD-10-CM | POA: Diagnosis not present

## 2022-04-19 DIAGNOSIS — F29 Unspecified psychosis not due to a substance or known physiological condition: Secondary | ICD-10-CM | POA: Diagnosis not present

## 2022-04-19 DIAGNOSIS — E876 Hypokalemia: Secondary | ICD-10-CM | POA: Diagnosis not present

## 2022-04-19 DIAGNOSIS — N3000 Acute cystitis without hematuria: Secondary | ICD-10-CM | POA: Diagnosis not present

## 2022-04-19 DIAGNOSIS — Z809 Family history of malignant neoplasm, unspecified: Secondary | ICD-10-CM | POA: Diagnosis not present

## 2022-04-19 DIAGNOSIS — R064 Hyperventilation: Secondary | ICD-10-CM | POA: Diagnosis not present

## 2022-04-19 DIAGNOSIS — Z79899 Other long term (current) drug therapy: Secondary | ICD-10-CM | POA: Diagnosis not present

## 2022-04-19 DIAGNOSIS — Z9151 Personal history of suicidal behavior: Secondary | ICD-10-CM | POA: Diagnosis not present

## 2022-04-19 DIAGNOSIS — R0689 Other abnormalities of breathing: Secondary | ICD-10-CM | POA: Diagnosis not present

## 2022-04-19 LAB — CBC WITH DIFFERENTIAL/PLATELET
Abs Immature Granulocytes: 0.02 10*3/uL (ref 0.00–0.07)
Basophils Absolute: 0 10*3/uL (ref 0.0–0.1)
Basophils Relative: 1 %
Eosinophils Absolute: 0.1 10*3/uL (ref 0.0–0.5)
Eosinophils Relative: 1 %
HCT: 38.8 % (ref 36.0–46.0)
Hemoglobin: 13.3 g/dL (ref 12.0–15.0)
Immature Granulocytes: 0 %
Lymphocytes Relative: 27 %
Lymphs Abs: 1.6 10*3/uL (ref 0.7–4.0)
MCH: 32.2 pg (ref 26.0–34.0)
MCHC: 34.3 g/dL (ref 30.0–36.0)
MCV: 93.9 fL (ref 80.0–100.0)
Monocytes Absolute: 0.9 10*3/uL (ref 0.1–1.0)
Monocytes Relative: 16 %
Neutro Abs: 3.1 10*3/uL (ref 1.7–7.7)
Neutrophils Relative %: 55 %
Platelets: 126 10*3/uL — ABNORMAL LOW (ref 150–400)
RBC: 4.13 MIL/uL (ref 3.87–5.11)
RDW: 15 % (ref 11.5–15.5)
WBC: 5.7 10*3/uL (ref 4.0–10.5)
nRBC: 0 % (ref 0.0–0.2)

## 2022-04-19 LAB — COMPREHENSIVE METABOLIC PANEL
ALT: 26 U/L (ref 0–44)
AST: 27 U/L (ref 15–41)
Albumin: 3.7 g/dL (ref 3.5–5.0)
Alkaline Phosphatase: 47 U/L (ref 38–126)
Anion gap: 10 (ref 5–15)
BUN: 21 mg/dL — ABNORMAL HIGH (ref 6–20)
CO2: 27 mmol/L (ref 22–32)
Calcium: 8.7 mg/dL — ABNORMAL LOW (ref 8.9–10.3)
Chloride: 102 mmol/L (ref 98–111)
Creatinine, Ser: 0.64 mg/dL (ref 0.44–1.00)
GFR, Estimated: >60 mL/min (ref >60)
Glucose, Bld: 82 mg/dL (ref 70–99)
Potassium: 2.6 mmol/L — CL (ref 3.5–5.1)
Sodium: 139 mmol/L (ref 135–145)
Total Bilirubin: 1.1 mg/dL (ref 0.3–1.2)
Total Protein: 6.3 g/dL — ABNORMAL LOW (ref 6.5–8.1)

## 2022-04-19 LAB — MAGNESIUM: Magnesium: 1.9 mg/dL (ref 1.7–2.4)

## 2022-04-19 LAB — SALICYLATE LEVEL: Salicylate Lvl: <7 mg/dL — ABNORMAL LOW (ref 7.0–30.0)

## 2022-04-19 LAB — ACETAMINOPHEN LEVEL: Acetaminophen (Tylenol), Serum: <10 ug/mL — ABNORMAL LOW (ref 10–30)

## 2022-04-19 LAB — ETHANOL: Alcohol, Ethyl (B): <10 mg/dL (ref <10)

## 2022-04-19 MED ORDER — POTASSIUM CHLORIDE CRYS ER 20 MEQ PO TBCR
40.0000 meq | EXTENDED_RELEASE_TABLET | Freq: Once | ORAL | Status: AC
  Administered 2022-04-19: 40 meq via ORAL
  Filled 2022-04-19: qty 2

## 2022-04-19 MED ORDER — LORAZEPAM 2 MG/ML IJ SOLN
1.0000 mg | Freq: Once | INTRAMUSCULAR | Status: AC
  Administered 2022-04-19: 1 mg via INTRAVENOUS
  Filled 2022-04-19: qty 1

## 2022-04-19 MED ORDER — POTASSIUM CHLORIDE 10 MEQ/100ML IV SOLN
10.0000 meq | INTRAVENOUS | Status: AC
  Administered 2022-04-19 – 2022-04-20 (×3): 10 meq via INTRAVENOUS
  Filled 2022-04-19 (×6): qty 100

## 2022-04-19 NOTE — ED Triage Notes (Signed)
Pt arrives via [facility] from home. Per report by medic the pt has had For about 2 days has had delusions, increased anxiety at night. Hyperventilating en route, difficult to redirect. 2.5 versed given en route with decreased symptoms. En route, Hr 90, 106/68, RR 12-16, 97% RA. IV established in the left [facility].

## 2022-04-19 NOTE — ED Provider Notes (Signed)
[facility] DEPT Provider Note   CSN: [id number] Arrival date & time: 04/19/22  1928     History  Chief Complaint  Patient presents with   Anxiety    Christine Bishop is a 56 y.o. female with a PMHx of depression, GAD who presents to the ED with concerns for anxiety today. Notes that she thinks that she had a panic attack PTA. Denies chest pain, shortness of breath, palpitations, abdominal pain, nausea, vomiting, SI, HI, auditory/visual hallucinations. Pt notes that she has been compliant with her medications.   The history is provided by the patient. No language interpreter was used.       Home Medications Prior to Admission medications   Medication Sig Start Date End Date Taking? Authorizing Provider  buPROPion (WELLBUTRIN XL) 150 MG 24 hr tablet Take 1 tablet (150 mg total) by mouth every morning. Take for 4 days before increasing to 300 mg daily 04/02/22 04/02/23  Vista Mink, MD  buPROPion (WELLBUTRIN XL) 300 MG 24 hr tablet Take 1 tablet (300 mg total) by mouth every morning. Start on 04/07/22 04/02/22 04/02/23  Vista Mink, MD  busPIRone (BUSPAR) 5 MG tablet Take 5 mg by mouth 2 (two) times daily. 09/19/21   [provider]  escitalopram (LEXAPRO) 20 MG tablet Take 1 tablet (20 mg total) by mouth daily. 04/02/22   Vista Mink, MD  ketorolac (ACULAR) 0.5 % ophthalmic solution INSTILL 1 DROP INTO THE RIGHT EYE 4 TIMES A DAY AS DIRECTED Patient taking differently: Place 1 drop into the right eye in the morning and at bedtime. 06/26/21   Rankin, Clent Demark, MD  Multiple Vitamins-Minerals (PRESERVISION AREDS PO) Take 1 tablet by mouth in the morning and at bedtime.    [provider]  nitrofurantoin, macrocrystal-monohydrate, (MACROBID) 100 MG capsule Take 1 capsule (100 mg total) by mouth at bedtime. 03/04/22   Corky Sox, MD  rosuvastatin (CRESTOR) 5 MG tablet Take 5 mg by mouth daily.    [provider]       Allergies    Inderal [propranolol], Septra [sulfamethoxazole-trimethoprim], Zonegran [zonisamide], and Topamax [topiramate]    Review of Systems   Review of Systems  All other systems reviewed and are negative.   Physical Exam Updated Vital Signs BP 102/71 (BP Location: Right Arm)   Pulse 90   Temp 98.2 F (36.8 C) (Oral)   Resp 20   SpO2 98%  Physical Exam Vitals and nursing note reviewed.  Constitutional:      General: She is not in acute distress.    Appearance: She is not diaphoretic.  HENT:     Head: Normocephalic and atraumatic.     Mouth/Throat:     Pharynx: No oropharyngeal exudate.  Eyes:     General: No scleral icterus.    Conjunctiva/sclera: Conjunctivae normal.  Cardiovascular:     Rate and Rhythm: Normal rate and regular rhythm.     Pulses: Normal pulses.     Heart sounds: Normal heart sounds.  Pulmonary:     Effort: Pulmonary effort is normal. No respiratory distress.     Breath sounds: Normal breath sounds. No wheezing.  Abdominal:     General: Bowel sounds are normal.     Palpations: Abdomen is soft. There is no mass.     Tenderness: There is no abdominal tenderness. There is no guarding or rebound.  Musculoskeletal:        General: Normal range of motion.  Cervical back: Normal range of motion and neck supple.  Skin:    General: Skin is warm and dry.  Neurological:     Mental Status: She is alert.  Psychiatric:        Behavior: Behavior normal.     ED Results / Procedures / Treatments   Labs (all labs ordered are listed, but only abnormal results are displayed) Labs Reviewed  COMPREHENSIVE METABOLIC PANEL  ETHANOL  RAPID URINE DRUG SCREEN, HOSP PERFORMED  CBC WITH DIFFERENTIAL/PLATELET  ACETAMINOPHEN LEVEL  SALICYLATE LEVEL  I-STAT BETA HCG BLOOD, ED ([facility], [facility], [facility] ONLY)    EKG None  Radiology No results found.  Procedures Procedures    Medications Ordered in ED Medications - No data to display  ED Course/ Medical  Decision Making/ A&P                           Medical Decision Making Amount and/or Complexity of Data Reviewed Labs: ordered.  Risk Prescription drug management.   Pt presents with concerns for increased anxiety onset PTA. Vital signs, pt afebrile. On exam, pt with no acute cardiovascular, respiratory, abdominal exam findings.    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 Pt with a  previous SI attempt ED visit on 02/24/22.  Labs:  I ordered, and personally interpreted labs.  The pertinent results include:   acetaminophen, salicylate, ethanol, UDS, CBC, CMP I-stat hcg ordered with results pending at time of sign out.   Patient case discussed with Vanice Sarah, PA-C at sign-out. Plan at sign-out is pending labs. Will need TTS consult, however, plans may change as per oncoming team. Patient care transferred at sign out.     This chart was dictated using voice recognition software, Dragon. Despite the best efforts of this provider to proofread and correct errors, errors may still occur which can change documentation meaning.   Final Clinical Impression(s) / ED Diagnoses Final diagnoses:  Anxiety    Rx / DC Orders ED Discharge Orders     None         Aarush Stukey A, PA-C 04/19/22 2218    Drenda Freeze, MD 04/19/22 2340

## 2022-04-19 NOTE — ED Notes (Signed)
Pt changed into scrubs, belongings removed, security called for wanding

## 2022-04-19 NOTE — ED Notes (Signed)
Patient aware that we need urine sample for testing, unable at this time. Pt given instruction on providing urine sample when able to do so.   

## 2022-04-19 NOTE — ED Notes (Signed)
This RN trying to obtain EKG, pt starts hyperventilating respirations in the 40's, shaking her hips and legs. Tried to redirect pt and encourage deep breathing. Pt says "I cannot stop" I asked her what she is worried about right now "I am going to die. I want to die, let me die, I don't want to die alone"

## 2022-04-19 NOTE — ED Provider Notes (Signed)
Patient was received at shift change from [facility] yet PA-C please see note for full detail  Patient with medical history including anxiety, depression presents  with complaints of a panic attack.  She notes that she has been having a panic attack since Tuesday, states that she feels very anxious, she states she felt slightly short of breath but currently has none this time, she had no associated chest pain or pleuritic chest pain, no near syncope diaphoresis, she denies any suicidal homicidal ideations denies any hallucinations or delusions.  Patient states that she overall feels more depressed as well as tired since she was [facility] the [facility], she states that she has not been eating or drinking very much states she is only eating about a bowl of cereal a day because she just has no appetite, she has no associated stomach pains nausea vomiting diarrhea, she states that she has a slight sore throat but no fevers or chills.  She has no other complaints she denies any illicit drug use.  Per previous previous provider medically clear and then consult TTS.   Physical Exam  BP 102/71 (BP Location: Right Arm)   Pulse 90   Temp 98.2 F (36.8 C) (Oral)   Resp 20   SpO2 98%   Physical Exam Vitals and nursing note reviewed.  Constitutional:      General: She is not in acute distress.    Appearance: She is not ill-appearing.     Comments: Disheveled.  HENT:     Head: Normocephalic and atraumatic.     Nose: No congestion.     Mouth/Throat:     Mouth: Mucous membranes are dry.     Pharynx: Oropharynx is clear.     Comments: Overall mucous is overall dry appearing, tongue uvula are both midline controlling oral secretions tonsils were equal symmetric bilaterally no submandibular swelling. Eyes:     Conjunctiva/sclera: Conjunctivae normal.  Cardiovascular:     Rate and Rhythm: Normal rate and regular rhythm.     Pulses: Normal pulses.     Heart sounds: No murmur heard.     No friction rub. No gallop.  Pulmonary:     Effort: No respiratory distress.     Breath sounds: No wheezing, rhonchi or rales.  Abdominal:     Palpations: Abdomen is soft.     Tenderness: There is no abdominal tenderness. There is no right CVA tenderness or left CVA tenderness.  Skin:    General: Skin is warm and dry.  Neurological:     Mental Status: She is alert.     Comments: Patient is alert and orient x 4, denies suicidal homicidal ideations, denies any hallucinations or delusions, she is responding appropriately, does not appear to be respond to internal stimuli, patient has a flat affect, and overall gloomy appearance.  Psychiatric:        Mood and Affect: Mood normal.     Procedures  Procedures  ED Course / MDM    Medical Decision Making Amount and/or Complexity of Data Reviewed Labs: ordered.  Risk Prescription drug management.     Co morbidities that complicate the patient evaluation  Anxiety, depression  Social Determinants of Health:  N/A    Lab Tests:  I Ordered, and personally interpreted labs.  The pertinent results include: CBC unremarkable, CMP shows potassium 2.6, BUN of 21, calcium 8.7 T. bili 6.3, ethanol negative, acetaminophen negative, mag 1.9, ECG is mildly elevated at 13.1, urine pregnancy   Imaging  Studies ordered:  I ordered imaging studies including N/A I independently visualized and interpreted imaging which showed n/a I agree with the radiologist interpretation   Cardiac Monitoring:  The patient was maintained on a cardiac monitor.  I personally viewed and interpreted the cardiac monitored which showed an underlying rhythm of: EKG without signs of ischemia   Medicines ordered and prescription drug management:  I ordered medication including fluids, potassium I have reviewed the patients home medicines and have made adjustments as needed  Critical Interventions:  N/A   Reevaluation:  Presents with depression, lab work  shows potassium of 2.6, this is likely consistent with poor oral intake, will add on magnesium, and prior with supplemental potassium.  Repeat BMP reveals potassium 3.3, will provide with oral potassium for the next 3 days, her i-STAT hCG is slightly elevated so this is a false positive, will follow-up with a urine pregnancy.  Patient is medically clear at this time will await TTS recommendations    Consultations Obtained:  TTS pending at this time   Test Considered:  N/a    Rule out I have low suspicion for withdrawals as patient is nontoxic-appearing, nontremulous on my exam vital signs are reassuring.  I have low suspicion for metabolic derailment as after potassium supplementation and has normalized, I suspect the decrease in it was from poor oral intake.    Dispostion and problem list  After consideration of the diagnostic results and the patients response to treatment, I feel that the patent would benefit from psych hold, home meds have been ordered, patient is not IVC, would recommend IVC if patient attempts to leave.           Marcello Fennel, PA-C 04/20/22 0547    Drenda Freeze, MD 04/20/22 [phone]

## 2022-04-20 ENCOUNTER — Encounter (HOSPITAL_COMMUNITY): Payer: Self-pay | Admitting: Psychiatry

## 2022-04-20 ENCOUNTER — Inpatient Hospital Stay (HOSPITAL_COMMUNITY)
Admission: AD | Admit: 2022-04-20 | Discharge: 2022-04-21 | DRG: 885 | Disposition: A | Discharge status: Other Acute Inpatient Hospital | Payer: Federal, State, Local not specified - PPO | Source: Intra-hospital | Attending: Emergency Medicine | Admitting: Emergency Medicine

## 2022-04-20 DIAGNOSIS — Z1152 Encounter for screening for COVID-19: Secondary | ICD-10-CM

## 2022-04-20 DIAGNOSIS — G43909 Migraine, unspecified, not intractable, without status migrainosus: Secondary | ICD-10-CM | POA: Diagnosis present

## 2022-04-20 DIAGNOSIS — Z9151 Personal history of suicidal behavior: Secondary | ICD-10-CM

## 2022-04-20 DIAGNOSIS — R000 Tachycardia, unspecified: Secondary | ICD-10-CM | POA: Diagnosis present

## 2022-04-20 DIAGNOSIS — Z809 Family history of malignant neoplasm, unspecified: Secondary | ICD-10-CM | POA: Diagnosis not present

## 2022-04-20 DIAGNOSIS — R45851 Suicidal ideations: Secondary | ICD-10-CM | POA: Diagnosis present

## 2022-04-20 DIAGNOSIS — Z888 Allergy status to other drugs, medicaments and biological substances status: Secondary | ICD-10-CM

## 2022-04-20 DIAGNOSIS — N3000 Acute cystitis without hematuria: Secondary | ICD-10-CM | POA: Diagnosis not present

## 2022-04-20 DIAGNOSIS — Z79899 Other long term (current) drug therapy: Secondary | ICD-10-CM | POA: Diagnosis not present

## 2022-04-20 DIAGNOSIS — F332 Major depressive disorder, recurrent severe without psychotic features: Principal | ICD-10-CM | POA: Diagnosis present

## 2022-04-20 DIAGNOSIS — E876 Hypokalemia: Secondary | ICD-10-CM | POA: Diagnosis not present

## 2022-04-20 DIAGNOSIS — R17 Unspecified jaundice: Secondary | ICD-10-CM | POA: Diagnosis not present

## 2022-04-20 DIAGNOSIS — Z83438 Family history of other disorder of lipoprotein metabolism and other lipidemia: Secondary | ICD-10-CM | POA: Diagnosis not present

## 2022-04-20 DIAGNOSIS — Z8249 Family history of ischemic heart disease and other diseases of the circulatory system: Secondary | ICD-10-CM

## 2022-04-20 DIAGNOSIS — F322 Major depressive disorder, single episode, severe without psychotic features: Secondary | ICD-10-CM | POA: Diagnosis not present

## 2022-04-20 DIAGNOSIS — F331 Major depressive disorder, recurrent, moderate: Secondary | ICD-10-CM | POA: Diagnosis not present

## 2022-04-20 DIAGNOSIS — Z882 Allergy status to sulfonamides status: Secondary | ICD-10-CM | POA: Diagnosis not present

## 2022-04-20 DIAGNOSIS — A419 Sepsis, unspecified organism: Secondary | ICD-10-CM | POA: Diagnosis not present

## 2022-04-20 DIAGNOSIS — F411 Generalized anxiety disorder: Secondary | ICD-10-CM | POA: Diagnosis present

## 2022-04-20 DIAGNOSIS — F5101 Primary insomnia: Secondary | ICD-10-CM | POA: Diagnosis not present

## 2022-04-20 DIAGNOSIS — R064 Hyperventilation: Secondary | ICD-10-CM | POA: Diagnosis present

## 2022-04-20 DIAGNOSIS — E782 Mixed hyperlipidemia: Secondary | ICD-10-CM | POA: Diagnosis not present

## 2022-04-20 DIAGNOSIS — Z87891 Personal history of nicotine dependence: Secondary | ICD-10-CM

## 2022-04-20 DIAGNOSIS — R001 Bradycardia, unspecified: Secondary | ICD-10-CM | POA: Diagnosis not present

## 2022-04-20 DIAGNOSIS — I959 Hypotension, unspecified: Secondary | ICD-10-CM | POA: Diagnosis not present

## 2022-04-20 DIAGNOSIS — N390 Urinary tract infection, site not specified: Secondary | ICD-10-CM | POA: Diagnosis not present

## 2022-04-20 DIAGNOSIS — R457 State of emotional shock and stress, unspecified: Secondary | ICD-10-CM | POA: Diagnosis not present

## 2022-04-20 LAB — BASIC METABOLIC PANEL
Anion gap: 8 (ref 5–15)
BUN: 17 mg/dL (ref 6–20)
CO2: 27 mmol/L (ref 22–32)
Calcium: 8 mg/dL — ABNORMAL LOW (ref 8.9–10.3)
Chloride: 103 mmol/L (ref 98–111)
Creatinine, Ser: 0.61 mg/dL (ref 0.44–1.00)
GFR, Estimated: >60 mL/min (ref >60)
Glucose, Bld: 83 mg/dL (ref 70–99)
Potassium: 3.3 mmol/L — ABNORMAL LOW (ref 3.5–5.1)
Sodium: 138 mmol/L (ref 135–145)

## 2022-04-20 LAB — CBC WITH DIFFERENTIAL/PLATELET
Abs Immature Granulocytes: 0.04 10*3/uL (ref 0.00–0.07)
Basophils Absolute: 0 10*3/uL (ref 0.0–0.1)
Basophils Relative: 1 %
Eosinophils Absolute: 0 10*3/uL (ref 0.0–0.5)
Eosinophils Relative: 0 %
HCT: 40.5 % (ref 36.0–46.0)
Hemoglobin: 13.4 g/dL (ref 12.0–15.0)
Immature Granulocytes: 1 %
Lymphocytes Relative: 15 %
Lymphs Abs: 1 10*3/uL (ref 0.7–4.0)
MCH: 31.8 pg (ref 26.0–34.0)
MCHC: 33.1 g/dL (ref 30.0–36.0)
MCV: 96.2 fL (ref 80.0–100.0)
Monocytes Absolute: 1 10*3/uL (ref 0.1–1.0)
Monocytes Relative: 15 %
Neutro Abs: 4.8 10*3/uL (ref 1.7–7.7)
Neutrophils Relative %: 68 %
Platelets: 126 10*3/uL — ABNORMAL LOW (ref 150–400)
RBC: 4.21 MIL/uL (ref 3.87–5.11)
RDW: 15.2 % (ref 11.5–15.5)
WBC: 6.9 10*3/uL (ref 4.0–10.5)
nRBC: 0 % (ref 0.0–0.2)

## 2022-04-20 LAB — COMPREHENSIVE METABOLIC PANEL
ALT: 31 U/L (ref 0–44)
AST: 40 U/L (ref 15–41)
Albumin: 3.6 g/dL (ref 3.5–5.0)
Alkaline Phosphatase: 51 U/L (ref 38–126)
Anion gap: 14 (ref 5–15)
BUN: 17 mg/dL (ref 6–20)
CO2: 19 mmol/L — ABNORMAL LOW (ref 22–32)
Calcium: 8.7 mg/dL — ABNORMAL LOW (ref 8.9–10.3)
Chloride: 106 mmol/L (ref 98–111)
Creatinine, Ser: 0.85 mg/dL (ref 0.44–1.00)
GFR, Estimated: >60 mL/min (ref >60)
Glucose, Bld: 82 mg/dL (ref 70–99)
Potassium: 3.5 mmol/L (ref 3.5–5.1)
Sodium: 139 mmol/L (ref 135–145)
Total Bilirubin: 1.4 mg/dL — ABNORMAL HIGH (ref 0.3–1.2)
Total Protein: 6.4 g/dL — ABNORMAL LOW (ref 6.5–8.1)

## 2022-04-20 LAB — SARS CORONAVIRUS 2 BY RT PCR: SARS Coronavirus 2 by RT PCR: NEGATIVE

## 2022-04-20 LAB — I-STAT BETA HCG BLOOD, ED (MC, WL, AP ONLY)
I-stat hCG, quantitative: 11.5 m[IU]/mL — ABNORMAL HIGH (ref <5)
I-stat hCG, quantitative: 13.1 m[IU]/mL — ABNORMAL HIGH (ref <5)

## 2022-04-20 LAB — GROUP A STREP BY PCR: Group A Strep by PCR: NOT DETECTED

## 2022-04-20 MED ORDER — SODIUM CHLORIDE 0.9 % IV BOLUS
1000.0000 mL | Freq: Once | INTRAVENOUS | Status: AC
  Administered 2022-04-20: 1000 mL via INTRAVENOUS

## 2022-04-20 MED ORDER — ROSUVASTATIN CALCIUM 5 MG PO TABS
5.0000 mg | ORAL_TABLET | Freq: Every day | ORAL | Status: DC
  Administered 2022-04-20: 5 mg via ORAL
  Filled 2022-04-20: qty 1

## 2022-04-20 MED ORDER — ESCITALOPRAM OXALATE 10 MG PO TABS
20.0000 mg | ORAL_TABLET | Freq: Every day | ORAL | Status: DC
  Administered 2022-04-20: 20 mg via ORAL
  Filled 2022-04-20: qty 2

## 2022-04-20 MED ORDER — BUPROPION HCL ER (XL) 150 MG PO TB24
300.0000 mg | ORAL_TABLET | ORAL | Status: DC
  Administered 2022-04-20: 300 mg via ORAL
  Filled 2022-04-20: qty 2

## 2022-04-20 NOTE — Progress Notes (Signed)
Provider was contacted by nursing staff regarding concerns over patient's current health. During patient's assessment, patient reported having trouble breathing that had been going on since yesterday. Patient's vitals were the following: Blood pressure 87/56 mmHg, pulse 107 bpm, O2 97%, respirations 18, temperature 98.3 F.  Patient reported that she had not eaten all day nor has she been able to void.  Provider recommended patient be transferred over to [facility] ED for medical clearance.  Provider gave report to both the charge nurse Lesly Rubenstein) and EDP (Dr. Maryan Rued).

## 2022-04-20 NOTE — ED Provider Notes (Signed)
Emergency Medicine Observation Re-evaluation Note  Christine Bishop is a 56 y.o. female, seen on rounds today.  Pt initially presented to the ED for complaints of Anxiety Currently, the patient is asleep.  Pt presented yesterday with anxiety and depression.  She did have low K which was replaced.  Repeat K this am 3.3.    Physical Exam  BP 103/64   Pulse 89   Temp 98 F (36.7 C)   Resp 16   SpO2 99%  Physical Exam General: asleep Cardiac: rr Lungs: clear Psych: calm  ED Course / MDM  EKG:   I have reviewed the labs performed to date as well as medications administered while in observation.  Recent changes in the last 24 hours include none.  Plan  Current plan is for awaiting TTS consult.  Pt is not IVC'd.  Pt is medically clear.    Isla Pence, MD 04/20/22 [phone]

## 2022-04-20 NOTE — Progress Notes (Signed)
[facility] LCSW Progress Note   04/20/2022    3:20 PM  Christine Bishop   [id number]   Type of Contact and Topic:  Psychiatric Bed Placement   Pt accepted to [facility] rm 300-2  Patient meets inpatient criteria per  Lindon Romp, NP  The attending provider will be Janine Limbo, MD  Call report to [phone]    Lura Em, RN @ [facility] ED notified.     Signed:  Durenda Hurt, MSW, LCSWA, LCAS 04/20/2022 3:21 PM

## 2022-04-20 NOTE — ED Triage Notes (Addendum)
Pt. BIB [facility] from [facility] for tachycardia and hypotension. Seen recently for anxiety and discharged to [facility].  EMS VS: BP: 114/68 HR:100 O2 98% RA

## 2022-04-20 NOTE — BH Assessment (Signed)
Clinician messaged [place], [employer]. [place], RN: "Hey. It's Trey with TTS. Is the pt able to engage in the assessment, if so the pt will need to be placed in a private room. Is the pt under IVC? Also is the pt medically cleared?"   Clinician awaiting response.    Vertell Novak, [place], [facility], [facility] Triage Specialist [phone]

## 2022-04-20 NOTE — ED Notes (Signed)
Pt received meal tray. 

## 2022-04-20 NOTE — ED Provider Notes (Signed)
  [facility] DEPT Provider Note   CSN: [id number] Arrival date & time: 04/20/22  1909     History {Add pertinent medical, surgical, social history, OB history to HPI:1} Chief Complaint  Patient presents with   Hypotension    Christine Bishop is a 56 y.o. female.  HPI     Home Medications Prior to Admission medications   Medication Sig Start Date End Date Taking? Authorizing Provider  buPROPion (WELLBUTRIN XL) 150 MG 24 hr tablet Take 1 tablet (150 mg total) by mouth every morning. Take for 4 days before increasing to 300 mg daily 04/02/22 04/02/23  Vista Mink, MD  buPROPion (WELLBUTRIN XL) 300 MG 24 hr tablet Take 1 tablet (300 mg total) by mouth every morning. Start on 04/07/22 04/02/22 04/02/23  Vista Mink, MD  busPIRone (BUSPAR) 5 MG tablet Take 5 mg by mouth 2 (two) times daily. 09/19/21   [provider]  escitalopram (LEXAPRO) 20 MG tablet Take 1 tablet (20 mg total) by mouth daily. 04/02/22   Vista Mink, MD  ketorolac (ACULAR) 0.5 % ophthalmic solution INSTILL 1 DROP INTO THE RIGHT EYE 4 TIMES A DAY AS DIRECTED Patient taking differently: Place 1 drop into the right eye in the morning and at bedtime. 06/26/21   Rankin, Clent Demark, MD  Multiple Vitamins-Minerals (PRESERVISION AREDS PO) Take 1 tablet by mouth in the morning and at bedtime.    [provider]  nitrofurantoin, macrocrystal-monohydrate, (MACROBID) 100 MG capsule Take 1 capsule (100 mg total) by mouth at bedtime. 03/04/22   Corky Sox, MD  rosuvastatin (CRESTOR) 5 MG tablet Take 5 mg by mouth daily.    [provider]      Allergies    Inderal [propranolol], Septra [sulfamethoxazole-trimethoprim], Zonegran [zonisamide], and Topamax [topiramate]    Review of Systems   Review of Systems  Physical Exam Updated Vital Signs BP 103/61   Pulse (!) 114   Temp 98.5 F (36.9 C) (Oral)   Resp (!) 25   SpO2 98%  Physical Exam  ED Results /  Procedures / Treatments   Labs (all labs ordered are listed, but only abnormal results are displayed) Labs Reviewed  CBC WITH DIFFERENTIAL/PLATELET  COMPREHENSIVE METABOLIC PANEL  PREGNANCY, URINE  I-STAT BETA HCG BLOOD, ED ([facility], [facility], [facility] ONLY)    EKG None  Radiology No results found.  Procedures Procedures  {Document cardiac monitor, telemetry assessment procedure when appropriate:1}  Medications Ordered in ED Medications  sodium chloride 0.9 % bolus 1,000 mL (1,000 mLs Intravenous New Bag/Given 04/20/22 2019)    ED Course/ Medical Decision Making/ A&P                           Medical Decision Making Amount and/or Complexity of Data Reviewed Labs: ordered.   ***  {Document critical care time when appropriate:1} {Document review of labs and clinical decision tools ie heart score, Chads2Vasc2 etc:1}  {Document your independent review of radiology images, and any outside records:1} {Document your discussion with family members, caretakers, and with consultants:1} {Document social determinants of health affecting pt's care:1} {Document your decision making why or why not admission, treatments were needed:1} Final Clinical Impression(s) / ED Diagnoses Final diagnoses:  None    Rx / DC Orders ED Discharge Orders     None

## 2022-04-20 NOTE — Consult Note (Signed)
[facility] ED ASSESSMENT   Reason for Consult:  SI/Anxiety Referring Physician:   Patient Identification: Christine Bishop MRN:  [id number] ED Chief Complaint: MDD (major depressive disorder), recurrent severe, without psychosis ([place])  Diagnosis:  Principal Problem:   MDD (major depressive disorder), recurrent severe, without psychosis ([place]) Active Problems:   Generalized anxiety disorder   ED Assessment Time Calculation: Start Time: 0920 Stop Time: 0945 Total Time in Minutes (Assessment Completion): 25   Subjective:   Christine Bishop is a 56 y.o. female patient admitted with a PMHx of depression, GAD, previous psychosis, and prior suicide attempts who presents to the ED with concerns for anxiety.  HPI:  The patient presents with a chief complaint of experiencing a severe panic attack that was challenging to control. She discloses a history of anxiety and depression, mentioning her current medication regimen of Lexapro '20mg'$  daily and Wellbutrin '300mg'$  daily. However, she admits to recently skipping doses. The patient acknowledges a past suicide attempt in May involving an overdose, and upon chart review, it reveals multiple suicide attempts, with the latest occurring on 02/24/2022 at [facility] under an IVC petition, where she intentionally overdosed on approximately 50 tablets of trazodone. Preceding this incident, she had a 4-day inpatient admission at [facility] on 02/12/2022 due to acute psychosis and paranoia. Currently, she expresses feelings of hopelessness, worthlessness, and helplessness, with no specific triggers identified. She rates her anxiety at 6/10 and depression at 8/10. The patient mentions seeing a psychiatrist once and is in the process of setting up therapy. 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 Denying use of illicit substances, including meth, crack/cocaine, nicotine, marijuana, and alcohol, she also denies auditory and visual  hallucinations. The patient reports past experiences with hallucinations after discontinuing Topomax and Zonegran but none since. She admits to suicidal ideations without a specific plan and denies homicidal thoughts. Sleep is diminished, with only about 4 hours per night, and there is a decreased appetite, resulting in a recent weight loss of approximately 20-30 pounds over 2-3 months.  During the evaluation, the patient's appearance was casual and appropriate for the environment. Her behavior was cooperative. She was able to communicate clearly with speech at a normal rate and decreased volume. Her mood was characterized by feelings of anxiety, depression, hopelessness, and worthlessness. Her affect was flat. Her thought process was coherent and linear, and his thought content was logical. There were no signs of delusions or indications that she was currently reacting to internal stimuli. The patient denied experiencing auditory or visual hallucinations. She endorsed suicidal ideations with no specific plan and denies homicidal ideations.  Past Psychiatric History: see above  Risk to Self or Others: Is the patient at risk to self? Yes Has the patient been a risk to self in the past 6 months? Yes Has the patient been a risk to self within the distant past? Yes Is the patient a risk to others? No Has the patient been a risk to others in the past 6 months? No Has the patient been a risk to others within the distant past? No  [place] Scale:  [facility] from 04/19/2022 in [facility] DEPT Office Visit from 04/02/2022 in [facility] Admission (Discharged) from 02/26/2022 in [facility]  C-SSRS RISK CATEGORY Moderate Risk Error: Q6 is Yes, you must answer 7 High Risk       AIMS:  , , ,  ,  ASAM:    Substance Abuse:     Past Medical History:  Past Medical History:  Diagnosis Date    Anxiety    Dysplastic nevus 12/24/2019   R upper back paraspinal - moderate   Dysplastic nevus 12/24/2019   R to mid upper back 3.0 cm lat to spine above braline - mild   History of migraine headaches    Posterior vitreous detachment of left eye 12/14/2019   Vitreous membranes and strands, left 04/20/2020   Vitrectomy left eye 04-27-20    Past Surgical History:  Procedure Laterality Date   CHOLECYSTECTOMY OPEN     LASIK     LEEP     Family History:  Family History  Problem Relation Age of Onset   Cancer Mother    Hypertension Father    Hyperlipidemia Father    Cancer Maternal Grandmother     Social History:  Social History   Substance and Sexual Activity  Alcohol Use No     Social History   Substance and Sexual Activity  Drug Use No    Social History   Socioeconomic History   Marital status: Married    Spouse name: Not on file   Number of children: 2   Years of education: Not on file   Highest education level: Not on file  Occupational History   Not on file  Tobacco Use   Smoking status: Former    Types: Cigarettes    Quit date: 06/07/2011    Years since quitting: 10.8    Passive exposure: Past   Smokeless tobacco: Never   Tobacco comments:    No smoking, does not need nicotine patch or gum  Vaping Use   Vaping Use: Never used  Substance and Sexual Activity   Alcohol use: No   Drug use: No   Sexual activity: Yes    Birth control/protection: None  Other Topics Concern   Not on file  Social History Narrative   Not on file   Social Determinants of Health   Financial Resource Strain: Not on file  Food Insecurity: No Food Insecurity (02/26/2022)   Hunger Vital Sign    Worried About Running Out of Food in the Last Year: Never true    Ran Out of Food in the Last Year: Never true  Transportation Needs: No Transportation Needs (02/26/2022)   PRAPARE - Hydrologist (Medical): No    Lack of Transportation (Non-Medical): No   Physical Activity: Not on file  Stress: Not on file  Social Connections: Not on file   Additional Social History:    Allergies:   Allergies  Allergen Reactions   Inderal [Propranolol] Other (See Comments)    Dizziness Syncopal episodes "makes me manic"   Septra [Sulfamethoxazole-Trimethoprim] Nausea And Vomiting   Zonegran [Zonisamide] Other (See Comments)    Bipolar/ Mania symptoms    Topamax [Topiramate] Anxiety, Palpitations and Other (See Comments)    Panic attacks Bi-Polar symptoms    Labs:  Results for orders placed or performed during the hospital encounter of 04/19/22 (from the past 48 hour(s))  Group A Strep by PCR     Status: None   Collection Time: 04/19/22 12:21 AM   Specimen: Throat; Sterile Swab  Result Value Ref Range   Group A Strep by PCR NOT DETECTED NOT DETECTED    Comment: Performed at [facility], [place] [address] [place], [place] [address]  Comprehensive metabolic panel     Status: Abnormal  Collection Time: 04/19/22  9:31 PM  Result Value Ref Range   Sodium 139 135 - 145 mmol/L   Potassium 2.6 (LL) 3.5 - 5.1 mmol/L    Comment: CRITICAL RESULT CALLED TO, READ BACK BY AND VERIFIED WITH RIMAFPO RN @ 2227 ON [id number] BY MAHMOUD,S    Chloride 102 98 - 111 mmol/L   CO2 27 22 - 32 mmol/L   Glucose, Bld 82 70 - 99 mg/dL    Comment: Glucose reference range applies only to samples taken after fasting for at least 8 hours.   BUN 21 (H) 6 - 20 mg/dL   Creatinine, Ser 0.64 0.44 - 1.00 mg/dL   Calcium 8.7 (L) 8.9 - 10.3 mg/dL   Total Protein 6.3 (L) 6.5 - 8.1 g/dL   Albumin 3.7 3.5 - 5.0 g/dL   AST 27 15 - 41 U/L   ALT 26 0 - 44 U/L   Alkaline Phosphatase 47 38 - 126 U/L   Total Bilirubin 1.1 0.3 - 1.2 mg/dL   GFR, Estimated >60 >60 mL/min    Comment: (NOTE) Calculated using the CKD-EPI Creatinine Equation (2021)    Anion gap 10 5 - 15    Comment: Performed at [facility], [place] [address] [place], [place]  [address]  Ethanol     Status: None   Collection Time: 04/19/22  9:31 PM  Result Value Ref Range   Alcohol, Ethyl (B) <10 <10 mg/dL    Comment: (NOTE) Lowest detectable limit for serum alcohol is 10 mg/dL.  For medical purposes only. Performed at [facility], [place] [address] [place], [place] [address]   CBC with Diff     Status: Abnormal   Collection Time: 04/19/22  9:31 PM  Result Value Ref Range   WBC 5.7 4.0 - 10.5 K/uL   RBC 4.13 3.87 - 5.11 MIL/uL   Hemoglobin 13.3 12.0 - 15.0 g/dL   HCT 38.8 36.0 - 46.0 %   MCV 93.9 80.0 - 100.0 fL   MCH 32.2 26.0 - 34.0 pg   MCHC 34.3 30.0 - 36.0 g/dL   RDW 15.0 11.5 - 15.5 %   Platelets 126 (L) 150 - 400 K/uL   nRBC 0.0 0.0 - 0.2 %   Neutrophils Relative % 55 %   Neutro Abs 3.1 1.7 - 7.7 K/uL   Lymphocytes Relative 27 %   Lymphs Abs 1.6 0.7 - 4.0 K/uL   Monocytes Relative 16 %   Monocytes Absolute 0.9 0.1 - 1.0 K/uL   Eosinophils Relative 1 %   Eosinophils Absolute 0.1 0.0 - 0.5 K/uL   Basophils Relative 1 %   Basophils Absolute 0.0 0.0 - 0.1 K/uL   Immature Granulocytes 0 %   Abs Immature Granulocytes 0.02 0.00 - 0.07 K/uL    Comment: Performed at [facility], [place] [address], [place], [place] [address]  Acetaminophen level     Status: Abnormal   Collection Time: 04/19/22  9:31 PM  Result Value Ref Range   Acetaminophen (Tylenol), Serum <10 (L) 10 - 30 ug/mL    Comment: (NOTE) Therapeutic concentrations vary significantly. A range of 10-30 ug/mL  may be an effective concentration for many patients. However, some  are best treated at concentrations outside of this range. Acetaminophen concentrations >150 ug/mL at 4 hours after ingestion  and >50 ug/mL at 12 hours after ingestion are often associated with  toxic reactions.  Performed at [facility], [place] [address] [place],  [place] [address]   Salicylate level     Status: Abnormal   Collection Time: 04/19/22  9:31 PM   Result Value Ref Range   Salicylate Lvl <5.4 (L) 7.0 - 30.0 mg/dL    Comment: Performed at [facility], [place] [address] [place], [place] [address]  Magnesium     Status: None   Collection Time: 04/19/22  9:31 PM  Result Value Ref Range   Magnesium 1.9 1.7 - 2.4 mg/dL    Comment: Performed at [facility], [place] [address] [place], [place] [address]  Basic metabolic panel     Status: Abnormal   Collection Time: 04/20/22  2:00 AM  Result Value Ref Range   Sodium 138 135 - 145 mmol/L   Potassium 3.3 (L) 3.5 - 5.1 mmol/L   Chloride 103 98 - 111 mmol/L   CO2 27 22 - 32 mmol/L   Glucose, Bld 83 70 - 99 mg/dL    Comment: Glucose reference range applies only to samples taken after fasting for at least 8 hours.   BUN 17 6 - 20 mg/dL   Creatinine, Ser 0.61 0.44 - 1.00 mg/dL   Calcium 8.0 (L) 8.9 - 10.3 mg/dL   GFR, Estimated >60 >60 mL/min    Comment: (NOTE) Calculated using the CKD-EPI Creatinine Equation (2021)    Anion gap 8 5 - 15    Comment: Performed at [facility], [place] [address] [place], [place] [address]  I-Stat beta hCG blood, ED     Status: Abnormal   Collection Time: 04/20/22  2:04 AM  Result Value Ref Range   I-stat hCG, quantitative 13.1 (H) <5 mIU/mL   Comment 3            Comment:   GEST. AGE      CONC.  (mIU/mL)   <=1 WEEK        5 - 50     2 WEEKS       50 - 500     3 WEEKS       100 - 10,000     4 WEEKS     1,000 - 30,000        FEMALE AND NON-PREGNANT FEMALE:     LESS THAN 5 mIU/mL     Current Facility-Administered Medications  Medication Dose Route Frequency Provider Last Rate Last Admin   buPROPion (WELLBUTRIN XL) 24 hr tablet 300 mg  300 mg Oral BH-q7a Faulkner, William J, PA-C   300 mg at 04/20/22 0910   escitalopram (LEXAPRO) tablet 20 mg  20 mg Oral Daily Marcello Fennel, PA-C   20 mg at 04/20/22 0910   rosuvastatin (CRESTOR) tablet 5 mg  5 mg Oral Daily Marcello Fennel, PA-C   5 mg at  04/20/22 9449   Current Outpatient Medications  Medication Sig Dispense Refill   buPROPion (WELLBUTRIN XL) 150 MG 24 hr tablet Take 1 tablet (150 mg total) by mouth every morning. Take for 4 days before increasing to 300 mg daily 4 tablet 0   buPROPion (WELLBUTRIN XL) 300 MG 24 hr tablet Take 1 tablet (300 mg total) by mouth every morning. Start on 04/07/22 30 tablet 0   busPIRone (BUSPAR) 5 MG tablet Take 5 mg by mouth 2 (two) times daily.     escitalopram (LEXAPRO) 20 MG tablet Take 1 tablet (20 mg total) by mouth daily. 30 tablet 1   ketorolac (ACULAR) 0.5 % ophthalmic solution INSTILL 1 DROP INTO  THE RIGHT EYE 4 TIMES A DAY AS DIRECTED (Patient taking differently: Place 1 drop into the right eye in the morning and at bedtime.) 5 mL 6   Multiple Vitamins-Minerals (PRESERVISION AREDS PO) Take 1 tablet by mouth in the morning and at bedtime.     nitrofurantoin, macrocrystal-monohydrate, (MACROBID) 100 MG capsule Take 1 capsule (100 mg total) by mouth at bedtime. 15 capsule 0   rosuvastatin (CRESTOR) 5 MG tablet Take 5 mg by mouth daily.      Psychiatric Specialty Exam: Presentation  General Appearance:  Casual  Eye Contact: Fair  Speech: Clear and Coherent; Normal Rate  Speech Volume: Decreased  Handedness: Right   Mood and Affect  Mood: Depressed; Anxious; Worthless; Hopeless  Affect: Flat   Thought Process  Thought Processes: Coherent  Descriptions of Associations:Intact  Orientation:Full (Time, Place and Person)  Thought Content:Logical  History of Schizophrenia/Schizoaffective disorder:No data recorded Duration of Psychotic Symptoms:Less than six months  Hallucinations:Hallucinations: None  Ideas of Reference:None  Suicidal Thoughts:Suicidal Thoughts: Yes, Active  Homicidal Thoughts:Homicidal Thoughts: No   Sensorium  Memory: Immediate Good  Judgment: Impaired  Insight: Present   Executive Functions  Concentration: Fair  Attention  Span: Fair  Recall: [place] of Knowledge: Good  Language: Fair   Psychomotor Activity  Psychomotor Activity: Psychomotor Activity: Normal   Assets  Assets: Desire for Improvement; Financial Resources/Insurance; Housing    Sleep  Sleep: Sleep: Poor   Physical Exam: Physical Exam Constitutional:      General: She is not in acute distress.    Appearance: She is not ill-appearing, toxic-appearing or diaphoretic.  Eyes:     General:        Right eye: No discharge.        Left eye: No discharge.  Cardiovascular:     Rate and Rhythm: Normal rate.  Pulmonary:     Effort: Pulmonary effort is normal. No respiratory distress.  Musculoskeletal:        General: Normal range of motion.     Cervical back: Normal range of motion.  Neurological:     Mental Status: She is alert and oriented to person, place, and time.  Psychiatric:        Mood and Affect: Mood is anxious and depressed. Affect is flat.        Behavior: Behavior is cooperative.        Thought Content: Thought content is not paranoid. Thought content includes suicidal ideation. Thought content does not include homicidal ideation.    Review of Systems  Constitutional:  Positive for malaise/fatigue and weight loss.  Respiratory:  Negative for cough.   Cardiovascular:  Negative for chest pain.  Gastrointestinal:  Negative for diarrhea, nausea and vomiting.  Psychiatric/Behavioral:  Positive for depression and suicidal ideas. Negative for hallucinations. The patient is nervous/anxious and has insomnia.    Blood pressure 108/67, pulse 98, temperature 98.4 F (36.9 C), temperature source Oral, resp. rate 13, SpO2 98 %. There is no height or weight on file to calculate BMI.  Medical Decision Making:  haron ALEXAH KIVETT is a 56 y.o. female patient admitted with a PMHx of depression, GAD, previous psychosis, and prior suicide attempts who presents to the ED with concerns for anxiety.  HPI:  The patient presents  with a chief complaint of experiencing a severe panic attack that was challenging to control. She discloses a history of anxiety and depression, mentioning her current medication regimen of Lexapro '20mg'$  daily and Wellbutrin '300mg'$  daily. However, she  admits to recently skipping doses. The patient acknowledges a past suicide attempt in May involving an overdose, and upon chart review, it reveals multiple suicide attempts, with the latest occurring on 02/24/2022 at [facility] under an IVC petition, where she intentionally overdosed on approximately 50 tablets of trazodone. Preceding this incident, she had a 4-day inpatient admission at [facility] on 02/12/2022 due to acute psychosis and paranoia. Currently, she expresses feelings of hopelessness, worthlessness, and helplessness, with no specific triggers identified. She rates her anxiety at 6/10 and depression at 8/10. The patient mentions seeing a psychiatrist once and is in the process of setting up therapy. 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 Denying use of illicit substances, including meth, crack/cocaine, nicotine, marijuana, and alcohol, she also denies auditory and visual hallucinations. The patient reports past experiences with hallucinations after discontinuing Topomax and Zonegran but none since. She admits to suicidal ideations without a specific plan and denies homicidal thoughts. Sleep is diminished, with only about 4 hours per night, and there is a decreased appetite, resulting in a recent weight loss of approximately 20-30 pounds over 2-3 months.  Continue lexapro 20 mg daily for depression Continue bupropion XL  300 mg daily for depression   Problem 1: MDD  Problem 2: GAD   Disposition: Recommend psychiatric Inpatient admission when medically cleared.  Rozetta Nunnery, NP 04/20/2022 11:36 AM

## 2022-04-20 NOTE — ED Provider Notes (Signed)
Patient accepted by behavioral health here.  Patient will be admitted to behavioral health.   Fredia Sorrow, MD 04/20/22 1726

## 2022-04-20 NOTE — BH Assessment (Signed)
Per Vernon Prey, RN pt is sleeping, not able to engage in TTS assessment.    Vertell Novak, [place], [facility], [facility] Triage Specialist [phone]

## 2022-04-21 ENCOUNTER — Inpatient Hospital Stay (HOSPITAL_COMMUNITY)
Admission: EM | Admit: 2022-04-21 | Discharge: 2022-04-25 | DRG: 872 | Disposition: A | Discharge status: Other Acute Inpatient Hospital | Payer: Federal, State, Local not specified - PPO | Source: Other Acute Inpatient Hospital | Attending: Internal Medicine | Admitting: Internal Medicine

## 2022-04-21 ENCOUNTER — Encounter (HOSPITAL_COMMUNITY): Payer: Self-pay | Admitting: Internal Medicine

## 2022-04-21 ENCOUNTER — Other Ambulatory Visit: Payer: Self-pay

## 2022-04-21 DIAGNOSIS — Z888 Allergy status to other drugs, medicaments and biological substances status: Secondary | ICD-10-CM | POA: Diagnosis not present

## 2022-04-21 DIAGNOSIS — Z882 Allergy status to sulfonamides status: Secondary | ICD-10-CM | POA: Diagnosis not present

## 2022-04-21 DIAGNOSIS — N390 Urinary tract infection, site not specified: Secondary | ICD-10-CM | POA: Diagnosis not present

## 2022-04-21 DIAGNOSIS — G4700 Insomnia, unspecified: Secondary | ICD-10-CM | POA: Diagnosis present

## 2022-04-21 DIAGNOSIS — R17 Unspecified jaundice: Secondary | ICD-10-CM | POA: Diagnosis present

## 2022-04-21 DIAGNOSIS — A419 Sepsis, unspecified organism: Principal | ICD-10-CM | POA: Diagnosis present

## 2022-04-21 DIAGNOSIS — Z79899 Other long term (current) drug therapy: Secondary | ICD-10-CM

## 2022-04-21 DIAGNOSIS — Z87891 Personal history of nicotine dependence: Secondary | ICD-10-CM | POA: Diagnosis not present

## 2022-04-21 DIAGNOSIS — F3164 Bipolar disorder, current episode mixed, severe, with psychotic features: Secondary | ICD-10-CM | POA: Diagnosis present

## 2022-04-21 DIAGNOSIS — F322 Major depressive disorder, single episode, severe without psychotic features: Secondary | ICD-10-CM | POA: Diagnosis present

## 2022-04-21 DIAGNOSIS — Z83438 Family history of other disorder of lipoprotein metabolism and other lipidemia: Secondary | ICD-10-CM | POA: Diagnosis not present

## 2022-04-21 DIAGNOSIS — F5101 Primary insomnia: Secondary | ICD-10-CM | POA: Diagnosis not present

## 2022-04-21 DIAGNOSIS — E782 Mixed hyperlipidemia: Secondary | ICD-10-CM | POA: Diagnosis not present

## 2022-04-21 DIAGNOSIS — E876 Hypokalemia: Secondary | ICD-10-CM | POA: Diagnosis not present

## 2022-04-21 DIAGNOSIS — I959 Hypotension, unspecified: Secondary | ICD-10-CM | POA: Diagnosis not present

## 2022-04-21 DIAGNOSIS — F331 Major depressive disorder, recurrent, moderate: Secondary | ICD-10-CM | POA: Diagnosis not present

## 2022-04-21 DIAGNOSIS — F411 Generalized anxiety disorder: Secondary | ICD-10-CM | POA: Diagnosis not present

## 2022-04-21 DIAGNOSIS — F339 Major depressive disorder, recurrent, unspecified: Secondary | ICD-10-CM | POA: Diagnosis present

## 2022-04-21 LAB — RAPID URINE DRUG SCREEN, HOSP PERFORMED
Amphetamines: NOT DETECTED
Barbiturates: NOT DETECTED
Benzodiazepines: POSITIVE — AB
Cocaine: NOT DETECTED
Opiates: NOT DETECTED
Tetrahydrocannabinol: NOT DETECTED

## 2022-04-21 LAB — URINALYSIS, ROUTINE W REFLEX MICROSCOPIC
Glucose, UA: NEGATIVE mg/dL
Hgb urine dipstick: NEGATIVE
Ketones, ur: 40 mg/dL — AB
Nitrite: NEGATIVE
Specific Gravity, Urine: 1.015 (ref 1.005–1.030)
pH: 7 (ref 5.0–8.0)

## 2022-04-21 LAB — PREGNANCY, URINE: Preg Test, Ur: NEGATIVE

## 2022-04-21 MED ORDER — ENOXAPARIN SODIUM 40 MG/0.4ML IJ SOSY
40.0000 mg | PREFILLED_SYRINGE | INTRAMUSCULAR | Status: DC
  Administered 2022-04-21 – 2022-04-24 (×4): 40 mg via SUBCUTANEOUS
  Filled 2022-04-21 (×4): qty 0.4

## 2022-04-21 MED ORDER — LACTATED RINGERS IV BOLUS
1000.0000 mL | Freq: Once | INTRAVENOUS | Status: AC
  Administered 2022-04-21: 1000 mL via INTRAVENOUS

## 2022-04-21 MED ORDER — ONDANSETRON HCL 4 MG PO TABS: 4.0000 mg | ORAL_TABLET | Freq: Four times a day (QID) | ORAL | Status: DC | PRN

## 2022-04-21 MED ORDER — ACETAMINOPHEN 325 MG PO TABS: 650.0000 mg | ORAL_TABLET | Freq: Four times a day (QID) | ORAL | Status: DC | PRN

## 2022-04-21 MED ORDER — SODIUM CHLORIDE 0.9 % IV SOLN
1.0000 g | Freq: Once | INTRAVENOUS | Status: AC
  Administered 2022-04-21: 1 g via INTRAVENOUS
  Filled 2022-04-21: qty 10

## 2022-04-21 MED ORDER — SODIUM CHLORIDE 0.9 % IV SOLN
1.0000 g | INTRAVENOUS | Status: DC
  Administered 2022-04-22 – 2022-04-23 (×2): 1 g via INTRAVENOUS
  Filled 2022-04-21 (×2): qty 10

## 2022-04-21 MED ORDER — ESCITALOPRAM OXALATE 20 MG PO TABS
20.0000 mg | ORAL_TABLET | Freq: Every day | ORAL | Status: DC
  Administered 2022-04-22 – 2022-04-25 (×4): 20 mg via ORAL
  Filled 2022-04-21 (×5): qty 1

## 2022-04-21 MED ORDER — BUPROPION HCL ER (XL) 300 MG PO TB24
300.0000 mg | ORAL_TABLET | Freq: Every day | ORAL | Status: DC
  Administered 2022-04-22 – 2022-04-23 (×2): 300 mg via ORAL
  Filled 2022-04-21 (×3): qty 1

## 2022-04-21 MED ORDER — POTASSIUM CHLORIDE CRYS ER 20 MEQ PO TBCR
40.0000 meq | EXTENDED_RELEASE_TABLET | Freq: Once | ORAL | Status: AC
  Administered 2022-04-21: 40 meq via ORAL
  Filled 2022-04-21: qty 2

## 2022-04-21 MED ORDER — ONDANSETRON HCL 4 MG/2ML IJ SOLN
4.0000 mg | Freq: Four times a day (QID) | INTRAMUSCULAR | Status: DC | PRN
  Administered 2022-04-21 – 2022-04-25 (×2): 4 mg via INTRAVENOUS
  Filled 2022-04-21 (×2): qty 2

## 2022-04-21 MED ORDER — ROSUVASTATIN CALCIUM 5 MG PO TABS
5.0000 mg | ORAL_TABLET | Freq: Every day | ORAL | Status: DC
  Administered 2022-04-22 – 2022-04-25 (×4): 5 mg via ORAL
  Filled 2022-04-21 (×6): qty 1

## 2022-04-21 MED ORDER — BUSPIRONE HCL 5 MG PO TABS
5.0000 mg | ORAL_TABLET | Freq: Two times a day (BID) | ORAL | Status: DC
  Administered 2022-04-22 – 2022-04-25 (×7): 5 mg via ORAL
  Filled 2022-04-21 (×8): qty 1

## 2022-04-21 MED ORDER — ACETAMINOPHEN 650 MG RE SUPP: 650.0000 mg | Freq: Four times a day (QID) | RECTAL | Status: DC | PRN

## 2022-04-21 MED ORDER — SODIUM CHLORIDE 0.9 % IV SOLN
INTRAVENOUS | Status: AC
  Administered 2022-04-21: 1000 mL via INTRAVENOUS

## 2022-04-21 MED ORDER — LORAZEPAM 2 MG/ML IJ SOLN
1.0000 mg | Freq: Once | INTRAMUSCULAR | Status: AC
  Administered 2022-04-21: 1 mg via INTRAVENOUS
  Filled 2022-04-21: qty 1

## 2022-04-21 MED ORDER — TRAZODONE HCL 50 MG PO TABS
50.0000 mg | ORAL_TABLET | Freq: Every evening | ORAL | Status: DC | PRN
  Filled 2022-04-21: qty 1

## 2022-04-21 MED ORDER — SODIUM CHLORIDE 0.9 % IV SOLN: 1.0000 g | INTRAVENOUS | Status: DC

## 2022-04-21 NOTE — H&P (Signed)
History and Physical    Patient: Christine Bishop FYB:[id number] DOB: 08-24-1965 DOA: 04/21/2022 DOS: the patient was seen and examined on 04/21/2022 PCP: Ronnell Freshwater, NP  Patient coming from: Home  Chief Complaint:  Chief Complaint  Patient presents with   Hypotension   HPI: Christine Bishop is a 56 y.o. female with medical history significant of   ED course: Initial vital signs temperature 98.5 F, pulse 75, respirations 25, BP 86/62 mmHg and O2 sat 98% on room air.  The patient received 1000 mL normal saline bolus, LR 2000 mL bolus, lorazepam 1 mg IVP and ceftriaxone 1 g IVPB.  UDS was positive for benzodiazepines.  Urinalysis with small bilirubinuria, ketonuria 40, trace proteinuria, trace leukocyte esterase, 6-10 WBC and many bacteria microscopic examination.  Urine pregnancy test was negative.  CBC showed a white count of 6.9, hemoglobin 13.4 g/dL platelets 126.  CMP with normal renal function and electrolytes, except for CO2 of 19 mmol/L.  Anion gap was normal.  Total protein 6.4 g/dL and total bilirubin 1.4 mg deciliter.  The rest of the LFTs were normal.   Review of Systems: As mentioned in the history of present illness. All other systems reviewed and are negative. Past Medical History:  Diagnosis Date   Anxiety    Dysplastic nevus 12/24/2019   R upper back paraspinal - moderate   Dysplastic nevus 12/24/2019   R to mid upper back 3.0 cm lat to spine above braline - mild   History of migraine headaches    Posterior vitreous detachment of left eye 12/14/2019   Vitreous membranes and strands, left 04/20/2020   Vitrectomy left eye 04-27-20   Past Surgical History:  Procedure Laterality Date   CHOLECYSTECTOMY OPEN     LASIK     LEEP     Social History:  reports that she quit smoking about 10 years ago. Her smoking use included cigarettes. She has been exposed to tobacco smoke. She has never used smokeless tobacco. She reports that she does not drink alcohol and does  not use drugs.  Allergies  Allergen Reactions   Inderal [Propranolol] Other (See Comments)    Dizziness Syncopal episodes "makes me manic"   Septra [Sulfamethoxazole-Trimethoprim] Nausea And Vomiting   Zonegran [Zonisamide] Other (See Comments)    Bipolar/ Mania symptoms    Topamax [Topiramate] Anxiety, Palpitations and Other (See Comments)    Panic attacks Bi-Polar symptoms    Family History  Problem Relation Age of Onset   Cancer Mother    Hypertension Father    Hyperlipidemia Father    Cancer Maternal Grandmother     Prior to Admission medications   Medication Sig Start Date End Date Taking? Authorizing Provider  buPROPion (WELLBUTRIN XL) 150 MG 24 hr tablet Take 1 tablet (150 mg total) by mouth every morning. Take for 4 days before increasing to 300 mg daily 04/02/22 04/02/23  Vista Mink, MD  buPROPion (WELLBUTRIN XL) 300 MG 24 hr tablet Take 1 tablet (300 mg total) by mouth every morning. Start on 04/07/22 04/02/22 04/02/23  Vista Mink, MD  busPIRone (BUSPAR) 5 MG tablet Take 5 mg by mouth 2 (two) times daily. 09/19/21   [provider]  escitalopram (LEXAPRO) 20 MG tablet Take 1 tablet (20 mg total) by mouth daily. 04/02/22   Vista Mink, MD  ketorolac (ACULAR) 0.5 % ophthalmic solution INSTILL 1 DROP INTO THE RIGHT EYE 4 TIMES A DAY AS DIRECTED Patient taking differently: Place 1 drop into  the right eye in the morning and at bedtime. 06/26/21   Rankin, Clent Demark, MD  Multiple Vitamins-Minerals (PRESERVISION AREDS PO) Take 1 tablet by mouth in the morning and at bedtime.    [provider]  nitrofurantoin, macrocrystal-monohydrate, (MACROBID) 100 MG capsule Take 1 capsule (100 mg total) by mouth at bedtime. 03/04/22   Corky Sox, MD  rosuvastatin (CRESTOR) 5 MG tablet Take 5 mg by mouth daily.    [provider]    Physical Exam: Vitals:   04/21/22 1140  BP: 110/72  Pulse: (!) 103  Resp: 20  Temp: 98.5 F (36.9 C)  SpO2: 97%    Physical Exam Vitals and nursing note reviewed.  Constitutional:      Appearance: Normal appearance.  HENT:     Head: Normocephalic.     Mouth/Throat:     Mouth: Mucous membranes are dry.  Musculoskeletal:     Cervical back: Neck supple.     Left lower leg: No edema.  Neurological:     Mental Status: She is alert.   Data Reviewed:  There are no new results to review at this time.  Assessment and Plan: Principal Problem:   Sepsis ([place]) Hypotension/tachycardia/tachypnea source of infection. Secondary to:   Acute lower UTI Admit to telemetry/inpatient Continue ceftriaxone 1 g IVPB daily. Follow-up urine culture and sensitivity. Follow-up CBC and chemistry in the morning.  Active Problems:   Depression, recurrent ([place]) Seen by psychiatry. Continue Wellbutrin 300 mg p.o. daily Continue buspirone 5 mg p.o. twice daily. Continue escitalopram 20 mg p.o. daily. Will consult behavioral health for follow-up.    Primary insomnia Trial of trazodone 50 mg at bedtime as needed.    Mixed hyperlipidemia Continue Crestor 5 mg p.o. daily.    Hyperbilirubinemia Minimal elevation only. Likely volume depletion. Recheck level in AM.      Advance Care Planning:   Code Status: Prior   Consults: Behavioral health.  Family Communication:   Severity of Illness: The appropriate patient status for this patient is INPATIENT. Inpatient status is judged to be reasonable and necessary in order to provide the required intensity of service to ensure the patient's safety. The patient's presenting symptoms, physical exam findings, and initial radiographic and laboratory data in the context of their chronic comorbidities is felt to place them at high risk for further clinical deterioration. Furthermore, it is not anticipated that the patient will be medically stable for discharge from the hospital within 2 midnights of admission.   * I certify that at the point of admission it is my  clinical judgment that the patient will require inpatient hospital care spanning beyond 2 midnights from the point of admission due to high intensity of service, high risk for further deterioration and high frequency of surveillance required.*  Author: Reubin Milan, MD 04/21/2022 12:13 PM  For on call review [email or web address].   This document was prepared using Dragon voice recognition software and may contain some unintended transcription errors.

## 2022-04-21 NOTE — ED Notes (Signed)
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  She had to be discharged from [facility] so she can be admitted to [facility].

## 2022-04-21 NOTE — ED Provider Notes (Incomplete)
[facility] DEPT Provider Note   CSN: [id number] Arrival date & time: 04/21/22  1136     History {Add pertinent medical, surgical, social history, OB history to HPI:1} Chief Complaint  Patient presents with   Hypotension    Christine Bishop is a 56 y.o. female with *** presents with ***.   HPI     Home Medications Prior to Admission medications   Medication Sig Start Date End Date Taking? Authorizing Provider  buPROPion (WELLBUTRIN XL) 150 MG 24 hr tablet Take 1 tablet (150 mg total) by mouth every morning. Take for 4 days before increasing to 300 mg daily 04/02/22 04/02/23  Vista Mink, MD  buPROPion (WELLBUTRIN XL) 300 MG 24 hr tablet Take 1 tablet (300 mg total) by mouth every morning. Start on 04/07/22 04/02/22 04/02/23  Vista Mink, MD  busPIRone (BUSPAR) 5 MG tablet Take 5 mg by mouth 2 (two) times daily. 09/19/21   [provider]  escitalopram (LEXAPRO) 20 MG tablet Take 1 tablet (20 mg total) by mouth daily. 04/02/22   Vista Mink, MD  ketorolac (ACULAR) 0.5 % ophthalmic solution INSTILL 1 DROP INTO THE RIGHT EYE 4 TIMES A DAY AS DIRECTED Patient taking differently: Place 1 drop into the right eye in the morning and at bedtime. 06/26/21   Rankin, Clent Demark, MD  Multiple Vitamins-Minerals (PRESERVISION AREDS PO) Take 1 tablet by mouth in the morning and at bedtime.    [provider]  nitrofurantoin, macrocrystal-monohydrate, (MACROBID) 100 MG capsule Take 1 capsule (100 mg total) by mouth at bedtime. 03/04/22   Corky Sox, MD  rosuvastatin (CRESTOR) 5 MG tablet Take 5 mg by mouth daily.    [provider]      Allergies    Inderal [propranolol], Septra [sulfamethoxazole-trimethoprim], Zonegran [zonisamide], and Topamax [topiramate]    Review of Systems   Review of Systems Review of systems {pos/neg:18640::"Negative","Positive"} for ***.  A 10 point review of systems was performed and is negative  unless otherwise reported in HPI.  Physical Exam Updated Vital Signs BP 110/72   Pulse (!) 103   Temp 98.5 F (36.9 C)   Resp 20   SpO2 97%  Physical Exam General: Normal appearing {Desc; female/female:11659}, lying in bed.  HEENT: PERRLA, Sclera anicteric, MMM, trachea midline.  Cardiology: RRR, no murmurs/rubs/gallops. BL radial and DP pulses equal bilaterally.  Resp: Normal respiratory rate and effort. CTAB, no wheezes, rhonchi, crackles.  Abd: Soft, non-tender, non-distended. No rebound tenderness or guarding.  GU: Deferred. MSK: No peripheral edema or signs of trauma. Extremities without deformity or TTP. No cyanosis or clubbing. Skin: warm, dry. No rashes or lesions. Back: No CVA tenderness Neuro: A&Ox4, CNs II-XII grossly intact. MAEs. Sensation grossly intact.  Psych: Normal mood and affect.   ED Results / Procedures / Treatments   Labs (all labs ordered are listed, but only abnormal results are displayed) Labs Reviewed - No data to display  EKG None  Radiology No results found.  Procedures Procedures  {Document cardiac monitor, telemetry assessment procedure when appropriate:1}  Medications Ordered in ED Medications - No data to display  ED Course/ Medical Decision Making/ A&P                          Medical Decision Making   '@HNNMDM'$ @ ***  I have personally reviewed and interpreted all labs and imaging.  Patient was maintained on a cardiac monitor.  I have personally  interpreted the telemetry as ***.  For further updates please see ED course.  ED Course:     {Document critical care time when appropriate:1} {Document review of labs and clinical decision tools ie heart score, Chads2Vasc2 etc:1}  {Document your independent review of radiology images, and any outside records:1} {Document your discussion with family members, caretakers, and with consultants:1} {Document social determinants of health affecting pt's care:1} {Document your decision  making why or why not admission, treatments were needed:1} Final Clinical Impression(s) / ED Diagnoses Final diagnoses:  None    Rx / DC Orders ED Discharge Orders     None        This note was created using dictation software, which may contain spelling or grammatical errors.

## 2022-04-21 NOTE — ED Notes (Signed)
Pt discharged from [facility] IP and readmitted to [facility] for admission.

## 2022-04-21 NOTE — ED Triage Notes (Signed)
   Pt. BIB [facility] from [facility] for tachycardia and hypotension. Seen recently for anxiety and discharged to [facility] last night at 1900 for admission

## 2022-04-22 ENCOUNTER — Inpatient Hospital Stay (HOSPITAL_COMMUNITY): Payer: Federal, State, Local not specified - PPO

## 2022-04-22 DIAGNOSIS — I10 Essential (primary) hypertension: Secondary | ICD-10-CM | POA: Diagnosis not present

## 2022-04-22 DIAGNOSIS — F322 Major depressive disorder, single episode, severe without psychotic features: Secondary | ICD-10-CM

## 2022-04-22 DIAGNOSIS — R0602 Shortness of breath: Secondary | ICD-10-CM | POA: Diagnosis not present

## 2022-04-22 DIAGNOSIS — A419 Sepsis, unspecified organism: Secondary | ICD-10-CM | POA: Diagnosis not present

## 2022-04-22 LAB — COMPREHENSIVE METABOLIC PANEL
ALT: 23 U/L (ref 0–44)
AST: 22 U/L (ref 15–41)
Albumin: 2.9 g/dL — ABNORMAL LOW (ref 3.5–5.0)
Alkaline Phosphatase: 44 U/L (ref 38–126)
Anion gap: 7 (ref 5–15)
BUN: 8 mg/dL (ref 6–20)
CO2: 25 mmol/L (ref 22–32)
Calcium: 7.8 mg/dL — ABNORMAL LOW (ref 8.9–10.3)
Chloride: 112 mmol/L — ABNORMAL HIGH (ref 98–111)
Creatinine, Ser: 0.56 mg/dL (ref 0.44–1.00)
GFR, Estimated: >60 mL/min (ref >60)
Glucose, Bld: 83 mg/dL (ref 70–99)
Potassium: 3.4 mmol/L — ABNORMAL LOW (ref 3.5–5.1)
Sodium: 144 mmol/L (ref 135–145)
Total Bilirubin: 0.5 mg/dL (ref 0.3–1.2)
Total Protein: 5.1 g/dL — ABNORMAL LOW (ref 6.5–8.1)

## 2022-04-22 LAB — CBC WITH DIFFERENTIAL/PLATELET
Abs Immature Granulocytes: 0.02 10*3/uL (ref 0.00–0.07)
Basophils Absolute: 0 10*3/uL (ref 0.0–0.1)
Basophils Relative: 1 %
Eosinophils Absolute: 0.1 10*3/uL (ref 0.0–0.5)
Eosinophils Relative: 2 %
HCT: 34 % — ABNORMAL LOW (ref 36.0–46.0)
Hemoglobin: 10.8 g/dL — ABNORMAL LOW (ref 12.0–15.0)
Immature Granulocytes: 1 %
Lymphocytes Relative: 30 %
Lymphs Abs: 1.2 10*3/uL (ref 0.7–4.0)
MCH: 31.6 pg (ref 26.0–34.0)
MCHC: 31.8 g/dL (ref 30.0–36.0)
MCV: 99.4 fL (ref 80.0–100.0)
Monocytes Absolute: 0.6 10*3/uL (ref 0.1–1.0)
Monocytes Relative: 13 %
Neutro Abs: 2.2 10*3/uL (ref 1.7–7.7)
Neutrophils Relative %: 53 %
Platelets: 113 10*3/uL — ABNORMAL LOW (ref 150–400)
RBC: 3.42 MIL/uL — ABNORMAL LOW (ref 3.87–5.11)
RDW: 15.6 % — ABNORMAL HIGH (ref 11.5–15.5)
WBC: 4.1 10*3/uL (ref 4.0–10.5)
nRBC: 0 % (ref 0.0–0.2)

## 2022-04-22 LAB — URINE CULTURE: Culture: <10000 — AB

## 2022-04-22 LAB — HIV ANTIBODY (ROUTINE TESTING W REFLEX): HIV Screen 4th Generation wRfx: NONREACTIVE

## 2022-04-22 MED ORDER — POTASSIUM CHLORIDE CRYS ER 20 MEQ PO TBCR
40.0000 meq | EXTENDED_RELEASE_TABLET | Freq: Once | ORAL | Status: AC
  Administered 2022-04-22: 40 meq via ORAL
  Filled 2022-04-22: qty 2

## 2022-04-22 NOTE — Consult Note (Signed)
[facility] Face-to-Face Psychiatry Consult   Reason for Consult:  depression Referring Physician:  Dessa Phi, DO Patient Identification: Christine Bishop MRN:  [id number] Principal Diagnosis: Sepsis [facility]) Diagnosis:  Principal Problem:   Sepsis ([place]) Active Problems:   Primary insomnia   Mixed hyperlipidemia   Severe major depression without psychotic features ([place])   Depression, recurrent ([place])   Hyperbilirubinemia   Acute lower UTI   Total Time spent with patient: 1 hour  Subjective:   Christine Bishop is a 56 y.o. female patient admitted with anxiety/depression.  HPI:  Christine Bishop is a 56 y.o. female who carries the diagnoses for Generalized anxiety disorder, major depressive disorder who was admitted to [facility] due to worsening depression and recurrent anxiety attacks. She was sent to the ED from behavioral health hospital due to hypotension with blood pressure 87/56, tachycardia heart rate 107 and also complains of shortness of breath. Today, patient is awake, alert, calm, cooperative, oriented x 4. She reports ongoing anxiety, apprehensions, worries and depression characterized by low energy level, lack of motivation, hopelessness and passive suicidal ideation. However, patient denies mood swings, psychosis and delusional symptoms. Patient still meets criteria for [facility] inpatient psychiatric admission after she is medically  cleared.  Past Psychiatric History: as above  Risk to Self:  yes Risk to Others:  denies Prior Inpatient Therapy:  [facility] Prior Outpatient Therapy:  Yes  Past Medical History:  Past Medical History:  Diagnosis Date   Anxiety    Dysplastic nevus 12/24/2019   R upper back paraspinal - moderate   Dysplastic nevus 12/24/2019   R to mid upper back 3.0 cm lat to spine above braline - mild   History of migraine headaches    Posterior vitreous detachment of left eye 12/14/2019   Vitreous membranes and strands, left 04/20/2020   Vitrectomy left eye 04-27-20     Past Surgical History:  Procedure Laterality Date   CHOLECYSTECTOMY OPEN     LASIK     LEEP     Family History:  Family History  Problem Relation Age of Onset   Cancer Mother    Hypertension Father    Hyperlipidemia Father    Cancer Maternal Grandmother    Family Psychiatric  History:  Social History:  Social History   Substance and Sexual Activity  Alcohol Use No     Social History   Substance and Sexual Activity  Drug Use No    Social History   Socioeconomic History   Marital status: Married    Spouse name: Not on file   Number of children: 2   Years of education: Not on file   Highest education level: Not on file  Occupational History   Not on file  Tobacco Use   Smoking status: Former    Types: Cigarettes    Quit date: 06/07/2011    Years since quitting: 10.8    Passive exposure: Past   Smokeless tobacco: Never   Tobacco comments:    No smoking, does not need nicotine patch or gum  Vaping Use   Vaping Use: Never used  Substance and Sexual Activity   Alcohol use: No   Drug use: No   Sexual activity: Yes    Birth control/protection: None  Other Topics Concern   Not on file  Social History Narrative   Not on file   Social Determinants of Health   Financial Resource Strain: Not on file  Food Insecurity: No Food Insecurity (04/21/2022)   Hunger Vital  Sign    Worried About Charity fundraiser in the Last Year: Never true    [place] in the Last Year: Never true  Transportation Needs: No Transportation Needs (04/21/2022)   PRAPARE - Hydrologist (Medical): No    Lack of Transportation (Non-Medical): No  Physical Activity: Not on file  Stress: Not on file  Social Connections: Not on file   Additional Social History:    Allergies:   Allergies  Allergen Reactions   Inderal [Propranolol] Other (See Comments)    Dizziness Syncopal episodes "makes me manic"   Septra [Sulfamethoxazole-Trimethoprim] Nausea And  Vomiting   Zonegran [Zonisamide] Other (See Comments)    Bipolar/ Mania symptoms    Topamax [Topiramate] Anxiety, Palpitations and Other (See Comments)    Panic attacks Bi-Polar symptoms    Labs:  Results for orders placed or performed during the hospital encounter of 04/21/22 (from the past 48 hour(s))  CBC with Differential     Status: Abnormal   Collection Time: 04/22/22  5:41 AM  Result Value Ref Range   WBC 4.1 4.0 - 10.5 K/uL   RBC 3.42 (L) 3.87 - 5.11 MIL/uL   Hemoglobin 10.8 (L) 12.0 - 15.0 g/dL   HCT 34.0 (L) 36.0 - 46.0 %   MCV 99.4 80.0 - 100.0 fL   MCH 31.6 26.0 - 34.0 pg   MCHC 31.8 30.0 - 36.0 g/dL   RDW 15.6 (H) 11.5 - 15.5 %   Platelets 113 (L) 150 - 400 K/uL   nRBC 0.0 0.0 - 0.2 %   Neutrophils Relative % 53 %   Neutro Abs 2.2 1.7 - 7.7 K/uL   Lymphocytes Relative 30 %   Lymphs Abs 1.2 0.7 - 4.0 K/uL   Monocytes Relative 13 %   Monocytes Absolute 0.6 0.1 - 1.0 K/uL   Eosinophils Relative 2 %   Eosinophils Absolute 0.1 0.0 - 0.5 K/uL   Basophils Relative 1 %   Basophils Absolute 0.0 0.0 - 0.1 K/uL   Immature Granulocytes 1 %   Abs Immature Granulocytes 0.02 0.00 - 0.07 K/uL    Comment: Performed at [facility], [place] [address], [place], [place] [address]  Comprehensive metabolic panel     Status: Abnormal   Collection Time: 04/22/22  5:41 AM  Result Value Ref Range   Sodium 144 135 - 145 mmol/L   Potassium 3.4 (L) 3.5 - 5.1 mmol/L   Chloride 112 (H) 98 - 111 mmol/L   CO2 25 22 - 32 mmol/L   Glucose, Bld 83 70 - 99 mg/dL    Comment: Glucose reference range applies only to samples taken after fasting for at least 8 hours.   BUN 8 6 - 20 mg/dL   Creatinine, Ser 0.56 0.44 - 1.00 mg/dL   Calcium 7.8 (L) 8.9 - 10.3 mg/dL   Total Protein 5.1 (L) 6.5 - 8.1 g/dL   Albumin 2.9 (L) 3.5 - 5.0 g/dL   AST 22 15 - 41 U/L   ALT 23 0 - 44 U/L   Alkaline Phosphatase 44 38 - 126 U/L   Total Bilirubin 0.5 0.3 - 1.2 mg/dL   GFR, Estimated >60 >60  mL/min    Comment: (NOTE) Calculated using the CKD-EPI Creatinine Equation (2021)    Anion gap 7 5 - 15    Comment: Performed at [facility], [place] [address], [place], [place] [address]  HIV Antibody (routine testing w rflx)  Status: None   Collection Time: 04/22/22  5:41 AM  Result Value Ref Range   HIV Screen 4th Generation wRfx Non Reactive Non Reactive    Comment: Performed at [facility], [address] [place], [place] [address]    Current Facility-Administered Medications  Medication Dose Route Frequency Provider Last Rate Last Admin   acetaminophen (TYLENOL) tablet 650 mg  650 mg Oral Q6H PRN Reubin Milan, MD       Or   acetaminophen (TYLENOL) suppository 650 mg  650 mg Rectal Q6H PRN Reubin Milan, MD       buPROPion (WELLBUTRIN XL) 24 hr tablet 300 mg  300 mg Oral Daily Reubin Milan, MD   300 mg at 04/22/22 0901   busPIRone (BUSPAR) tablet 5 mg  5 mg Oral BID Reubin Milan, MD   5 mg at 04/22/22 0901   cefTRIAXone (ROCEPHIN) 1 g in sodium chloride 0.9 % 100 mL IVPB  1 g Intravenous Q24H Reubin Milan, MD 200 mL/hr at 04/22/22 0339 1 g at 04/22/22 0339   enoxaparin (LOVENOX) injection 40 mg  40 mg Subcutaneous Q24H Reubin Milan, MD   40 mg at 04/21/22 2124   escitalopram (LEXAPRO) tablet 20 mg  20 mg Oral Daily Reubin Milan, MD   20 mg at 04/22/22 0902   ondansetron (ZOFRAN) tablet 4 mg  4 mg Oral Q6H PRN Reubin Milan, MD       Or   ondansetron [facility]) injection 4 mg  4 mg Intravenous Q6H PRN Reubin Milan, MD   4 mg at 04/21/22 2004   rosuvastatin (CRESTOR) tablet 5 mg  5 mg Oral Daily Reubin Milan, MD   5 mg at 04/22/22 0901   traZODone (DESYREL) tablet 50 mg  50 mg Oral QHS PRN Reubin Milan, MD        Musculoskeletal: Strength & Muscle Tone: within normal limits Gait & Station:  not assessed Patient leans: N/A    Psychiatric Specialty Exam:  Presentation  General  Appearance:  Appropriate for Environment  Eye Contact: Good  Speech: Clear and Coherent  Speech Volume: Decreased  Handedness: Right   Mood and Affect  Mood: Depressed  Affect: Congruent   Thought Process  Thought Processes: Linear; Goal Directed  Descriptions of Associations:Intact  Orientation:Full (Time, Place and Person)  Thought Content:Logical  History of Schizophrenia/Schizoaffective disorder:No data recorded Duration of Psychotic Symptoms:Less than six months  Hallucinations:Hallucinations: None  Ideas of Reference:None  Suicidal Thoughts:Suicidal Thoughts: Yes, Active SI Active Intent and/or Plan: With Intent  Homicidal Thoughts:Homicidal Thoughts: No   Sensorium  Memory: Immediate Good; Recent Good; Remote Good  Judgment: Intact  Insight: Poor   Executive Functions  Concentration: Good  Attention Span: Good  Recall: Good  Fund of Knowledge: Good  Language: Good   Psychomotor Activity  Psychomotor Activity: Psychomotor Activity: Decreased   Assets  Assets: Communication Skills; Desire for Improvement; Social Support   Sleep  Sleep: Sleep: Fair   Physical Exam: Physical Exam Review of Systems  Psychiatric/Behavioral:  Positive for depression and suicidal ideas.    Blood pressure 101/66, pulse 98, temperature 98.5 F (36.9 C), temperature source Oral, resp. rate 14, height '5\' 6"'$  (1.676 m), weight 65.4 kg, SpO2 97 %. Body mass index is 23.27 kg/m.  Treatment Plan Summary: 56 y/o female who was admitted to [facility] for treatment of Anxiety and depression. She was transferred to the ED from behavioral health hospital due  to hypotension and tachycardia. However, patient reports ongoing anxiety, depression, passive suicidal thoughts. As a result, she will benefit from admission to [facility] after she is medically stabilized.  Plan/Recommendations: -Continue 1:1 sitter for safety -Continue Lexapro, Trazodone,Buspar and  Wellbutrin XL as currently prescribed for anxiety/depression -Social worker to facilitate patient's transfer back to [facility] after she is medically stabilized  Disposition: Recommend psychiatric Inpatient admission when medically cleared. Supportive therapy provided about ongoing stressors. Psychiatric consult service will continue to follow this patient  Corena Pilgrim, MD 04/22/2022 3:04 PM

## 2022-04-22 NOTE — Progress Notes (Signed)
PROGRESS NOTE    Christine Bishop  XIP:[id number] DOB: [birth year] DOA: 04/21/2022 PCP: Ronnell Freshwater, NP     Brief Narrative:  Christine Bishop is a 56 y.o. female with medical history significant of generalized anxiety disorder, major depressive disorder who was sent to the ED from behavioral health hospital due to hypotension with blood pressure 87/56, tachycardia heart rate 107 and also complains of shortness of breath.  In the emergency department, workup revealed UTI.  She was given fluid boluses and started on Rocephin.  Admitted to the hospitalist service.  Plan is for her to return back to [facility] on discharge.  New events last 24 hours / Subjective: Patient seen with sitter at bedside.  Patient voices no new complaints.  Assessment & Plan:   Principal Problem:   Sepsis ([place]) Active Problems:   Primary insomnia   Mixed hyperlipidemia   Depression, recurrent ([facility])   Hyperbilirubinemia   Acute lower UTI   Sepsis secondary to ?UTI, sepsis present on admission -Urine culture negative 12/2  -Blood cultures pending  -Continue empiric Rocephin -Check CXR   Major depressive disorder, generalized anxiety disorder -Wellbutrin, BuSpar, escitalopram -Discharge back to [facility] for discharge  Hyperlipidemia -Crestor  Insomnia -Trazodone  Hypokalemia -Replace   DVT prophylaxis:  enoxaparin (LOVENOX) injection 40 mg Start: 04/21/22 2200  Code Status: Full code Family Communication: No family at bedside Disposition Plan:  Status is: Inpatient Remains inpatient appropriate because: IV biotics   Antimicrobials:  Anti-infectives (From admission, onward)    Start     Dose/Rate Route Frequency Ordered Stop   04/22/22 0400  cefTRIAXone (ROCEPHIN) 1 g in sodium chloride 0.9 % 100 mL IVPB        1 g 200 mL/hr over 30 Minutes Intravenous Every 24 hours 04/21/22 1213 04/29/22 0359        Objective: Vitals:   04/21/22 1949 04/21/22 2311 04/22/22 0306 04/22/22 0649  BP:   (!) 96/58 115/67 (!) 111/59  Pulse:  100 98 98  Resp:  '18 18 18  '$ Temp:  98.2 F (36.8 C) 98.6 F (37 C) 98.1 F (36.7 C)  TempSrc:  Oral Oral Oral  SpO2:  96% 97% 97%  Weight: 65.4 kg     Height: '5\' 6"'$  (1.676 m)       Intake/Output Summary (Last 24 hours) at 04/22/2022 1200 Last data filed at 04/22/2022 0359 Gross per 24 hour  Intake 1368.59 ml  Output --  Net 1368.59 ml   Filed Weights   04/21/22 1949  Weight: 65.4 kg    Examination:  General exam: Appears calm and comfortable  Respiratory system: Clear to auscultation. Respiratory effort normal. No respiratory distress. No conversational dyspnea.  Cardiovascular system: S1 & S2 heard, RRR. No murmurs. No pedal edema. Gastrointestinal system: Abdomen is nondistended, soft and nontender. Normal bowel sounds heard. Central nervous system: Alert and oriented. No focal neurological deficits. Speech clear.  Extremities: Symmetric in appearance  Skin: No rashes, lesions or ulcers on exposed skin  Psychiatry: Judgement and insight appear normal. Mood & affect flat.   Data Reviewed: I have personally reviewed following labs and imaging studies  CBC: Recent Labs  Lab 04/19/22 2131 04/20/22 2330 04/22/22 0541  WBC 5.7 6.9 4.1  NEUTROABS 3.1 4.8 2.2  HGB 13.3 13.4 10.8*  HCT 38.8 40.5 34.0*  MCV 93.9 96.2 99.4  PLT 126* 126* 673*   Basic Metabolic Panel: Recent Labs  Lab 04/19/22 2131 04/20/22 0200 04/20/22 2330 04/22/22 0541  NA  139 138 139 144  K 2.6* 3.3* 3.5 3.4*  CL 102 103 106 112*  CO2 27 27 19* 25  GLUCOSE 82 83 82 83  BUN 21* '17 17 8  '$ CREATININE 0.64 0.61 0.85 0.56  CALCIUM 8.7* 8.0* 8.7* 7.8*  MG 1.9  --   --   --    GFR: Estimated Creatinine Clearance: 73.5 mL/min (by C-G formula based on SCr of 0.56 mg/dL). Liver Function Tests: Recent Labs  Lab 04/19/22 2131 04/20/22 2330 04/22/22 0541  AST 27 40 22  ALT '26 31 23  '$ ALKPHOS 47 51 44  BILITOT 1.1 1.4* 0.5  PROT 6.3* 6.4* 5.1*  ALBUMIN 3.7  3.6 2.9*   No results for input(s): "LIPASE", "AMYLASE" in the last 168 hours. No results for input(s): "AMMONIA" in the last 168 hours. Coagulation Profile: No results for input(s): "INR", "PROTIME" in the last 168 hours. Cardiac Enzymes: No results for input(s): "CKTOTAL", "CKMB", "CKMBINDEX", "TROPONINI" in the last 168 hours. BNP (last 3 results) No results for input(s): "PROBNP" in the last 8760 hours. HbA1C: No results for input(s): "HGBA1C" in the last 72 hours. CBG: No results for input(s): "GLUCAP" in the last 168 hours. Lipid Profile: No results for input(s): "CHOL", "HDL", "LDLCALC", "TRIG", "CHOLHDL", "LDLDIRECT" in the last 72 hours. Thyroid Function Tests: No results for input(s): "TSH", "T4TOTAL", "FREET4", "T3FREE", "THYROIDAB" in the last 72 hours. Anemia Panel: No results for input(s): "VITAMINB12", "FOLATE", "FERRITIN", "TIBC", "IRON", "RETICCTPCT" in the last 72 hours. Sepsis Labs: No results for input(s): "PROCALCITON", "LATICACIDVEN" in the last 168 hours.  Recent Results (from the past 240 hour(s))  Group A Strep by PCR     Status: None   Collection Time: 04/19/22 12:21 AM   Specimen: Throat; Sterile Swab  Result Value Ref Range Status   Group A Strep by PCR NOT DETECTED NOT DETECTED Final    Comment: Performed at [facility], [place] [address], [place], [place] [address]  SARS Coronavirus 2 by RT PCR (hospital order, performed in [facility] hospital lab) *cepheid single result test* Anterior Nasal Swab     Status: None   Collection Time: 04/20/22 12:41 PM   Specimen: Anterior Nasal Swab  Result Value Ref Range Status   SARS Coronavirus 2 by RT PCR NEGATIVE NEGATIVE Final    Comment: (NOTE) SARS-CoV-2 target nucleic acids are NOT DETECTED.  The SARS-CoV-2 RNA is generally detectable in upper and lower respiratory specimens during the acute phase of infection. The lowest concentration of SARS-CoV-2 viral copies this assay can detect is  250 copies / mL. A negative result does not preclude SARS-CoV-2 infection and should not be used as the sole basis for treatment or other patient management decisions.  A negative result may occur with improper specimen collection / handling, submission of specimen other than nasopharyngeal swab, presence of viral mutation(s) within the areas targeted by this assay, and inadequate number of viral copies (<250 copies / mL). A negative result must be combined with clinical observations, patient history, and epidemiological information.  Fact Sheet for Patients:   https://www.patel.info/  Fact Sheet for Healthcare Providers: https://hall.com/  This test is not yet approved or  cleared by the [place] FDA and has been authorized for detection and/or diagnosis of SARS-CoV-2 by FDA under an Emergency Use Authorization (EUA).  This EUA will remain in effect (meaning this test can be used) for the duration of the COVID-19 declaration under Section 564(b)(1) of the Act, 21 U.S.C. section 360bbb-3(b)(1),  unless the authorization is terminated or revoked sooner.  Performed at [facility], [place] [address] [place], [place] [address]   Urine Culture     Status: Abnormal   Collection Time: 04/21/22  1:00 AM   Specimen: Urine, Clean Catch  Result Value Ref Range Status   Specimen Description   Final    URINE, CLEAN CATCH Performed at [facility], [place] [address], [place], [place] [address]    Special Requests   Final    NONE Performed at [facility], [place] [address], [place], [place] [address]    Culture (A)  Final    <10,000 COLONIES/mL INSIGNIFICANT GROWTH Performed at [place] [address] [place], [place] [address]    Report Status 04/22/2022 FINAL  Final  Blood culture (routine x 2)     Status: None (Preliminary result)   Collection Time: 04/21/22  7:39 AM    Specimen: BLOOD LEFT HAND  Result Value Ref Range Status   Specimen Description   Final    BLOOD LEFT HAND Performed at [facility], [place] [address], [place], [place] [address]    Special Requests   Final    BOTTLES DRAWN AEROBIC ONLY Blood Culture adequate volume Performed at [place] [address], [place], [place] [address]    Culture   Final    NO GROWTH < 24 HOURS Performed at [place] [address] [place], [place] [address]    Report Status PENDING  Incomplete  Blood culture (routine x 2)     Status: None (Preliminary result)   Collection Time: 04/21/22  7:39 AM   Specimen: BLOOD  Result Value Ref Range Status   Specimen Description   Final    BLOOD RIGHT ANTECUBITAL Performed at [place] [address] [place], [place] [address]    Special Requests   Final    BOTTLES DRAWN AEROBIC ONLY Blood Culture adequate volume Performed at [place] [address], [place], [place] [address]    Culture   Final    NO GROWTH < 24 HOURS Performed at [place] [address], [place],  [address]    Report Status PENDING  Incomplete      Radiology Studies: No results found.    Scheduled Meds:  buPROPion  300 mg Oral Daily   busPIRone  5 mg Oral BID   enoxaparin (LOVENOX) injection  40 mg Subcutaneous Q24H   escitalopram  20 mg Oral Daily   rosuvastatin  5 mg Oral Daily   Continuous Infusions:  sodium chloride 125 mL/hr at 04/22/22 1102   cefTRIAXone (ROCEPHIN)  IV 1 g (04/22/22 0339)     LOS: 1 day     Dessa Phi, DO [facility] 04/22/2022, 12:00 PM   Available via Epic secure chat 7am-7pm After these hours, please refer to coverage provider listed on [email or web address]

## 2022-04-22 NOTE — Progress Notes (Signed)
Mobility Specialist - Progress Note   04/22/22 1042  Mobility  Activity Ambulated with assistance in hallway  Level of Assistance Modified independent, requires aide device or extra time  Assistive Device Other (Comment) (iv pole)  Distance Ambulated (ft) 200 ft  Activity Response Tolerated well  Mobility Referral Yes  $Mobility charge 1 Mobility   Pt received in bed and agreed to mobility, claimed to feel uneasy on feet but had no overt LOB during session. Pt returned to bed with all needs met and sitter in room.  Roderick Pee Mobility Specialist

## 2022-04-23 DIAGNOSIS — A419 Sepsis, unspecified organism: Secondary | ICD-10-CM | POA: Diagnosis not present

## 2022-04-23 DIAGNOSIS — F331 Major depressive disorder, recurrent, moderate: Secondary | ICD-10-CM | POA: Diagnosis not present

## 2022-04-23 LAB — GLUCOSE, CAPILLARY: Glucose-Capillary: 86 mg/dL (ref 70–99)

## 2022-04-23 LAB — MAGNESIUM: Magnesium: 1.8 mg/dL (ref 1.7–2.4)

## 2022-04-23 LAB — BASIC METABOLIC PANEL
Anion gap: 7 (ref 5–15)
BUN: <5 mg/dL — ABNORMAL LOW (ref 6–20)
CO2: 25 mmol/L (ref 22–32)
Calcium: 8.2 mg/dL — ABNORMAL LOW (ref 8.9–10.3)
Chloride: 110 mmol/L (ref 98–111)
Creatinine, Ser: 0.59 mg/dL (ref 0.44–1.00)
GFR, Estimated: >60 mL/min (ref >60)
Glucose, Bld: 88 mg/dL (ref 70–99)
Potassium: 3.6 mmol/L (ref 3.5–5.1)
Sodium: 142 mmol/L (ref 135–145)

## 2022-04-23 MED ORDER — FOSFOMYCIN TROMETHAMINE 3 G PO PACK
3.0000 g | PACK | Freq: Once | ORAL | Status: DC
  Filled 2022-04-23: qty 3

## 2022-04-23 NOTE — Progress Notes (Addendum)
  PROGRESS NOTE  Patient is medically cleared to return to [facility].   Dessa Phi, DO [facility] 04/23/2022, 2:35 PM  Available via Epic secure chat 7am-7pm After these hours, please refer to coverage provider listed on [email or web address]

## 2022-04-23 NOTE — Progress Notes (Signed)
Patient ambulated in hallway without assistance

## 2022-04-23 NOTE — Evaluation (Signed)
Physical Therapy Evaluation Patient Details Name: Christine Bishop MRN: [id number] DOB: 21-Apr-1966 Today's Date: 04/23/2022  History of Present Illness  56 y.o. female with medical history significant of generalized anxiety disorder, major depressive disorder who was [facility] due to hypotension with blood pressure 87/56, tachycardia heart rate 107 and also complains of shortness of breath.  In the emergency department, workup revealed UTI.  Clinical Impression  Pt admitted with above diagnosis.  Pt currently with functional limitations due to the deficits listed below (see PT Problem List). Pt will benefit from skilled PT to increase their independence and safety with mobility to allow discharge to the venue listed below.   Pt aware of physical therapy evaluation and starting to get OOB on PT arrival to room.  Pt reports generalized weakness and requested use of RW.  Pt appears steady with RW however observed small narrow steps.  Pt anticipates d/c today and requesting RN into room to take out IV, so she can get dressed and return to [facility].  Due to reported and observed LE weakness, encouraged pt to use RW upon d/c.  Pt agreeable.  If pt does not d/c today, will continue to assist with mobility in acute setting however no f/u needs anticipated.        Recommendations for follow up therapy are one component of a multi-disciplinary discharge planning process, led by the attending physician.  Recommendations may be updated based on patient status, additional functional criteria and insurance authorization.  Follow Up Recommendations No PT follow up      Assistance Recommended at Discharge PRN  Patient can return home with the following  A little help with walking and/or transfers    Equipment Recommendations Rolling walker (2 wheels)  Recommendations for Other Services       Functional Status Assessment Patient has had a recent decline in their functional  status and demonstrates the ability to make significant improvements in function in a reasonable and predictable amount of time.     Precautions / Restrictions Precautions Precautions: Fall      Mobility  Bed Mobility Overal bed mobility: Modified Independent                  Transfers Overall transfer level: Needs assistance Equipment used: Rolling walker (2 wheels) Transfers: Sit to/from Stand Sit to Stand: Min guard           General transfer comment: min/guard for safety    Ambulation/Gait Ambulation/Gait assistance: Min guard Gait Distance (Feet): 60 Feet Assistive device: Rolling walker (2 wheels) Gait Pattern/deviations: Step-through pattern, Decreased stride length, Narrow base of support Gait velocity: decr     General Gait Details: small narrow step but appears steady with RW, no LOB observed, min/guard for safety, denies any symptoms, only reports LEs feeling weak  Stairs            Wheelchair Mobility    Modified Rankin (Stroke Patients Only)       Balance Overall balance assessment: Needs assistance         Standing balance support: No upper extremity supported Standing balance-Leahy Scale: Fair                               Pertinent Vitals/Pain Pain Assessment Pain Assessment: No/denies pain    Home Living Family/patient expects to be discharged to:: Private residence  Additional Comments: pt presented from [facility] and to return to [facility] upon d/c    Prior Function Prior Level of Function : Independent/Modified Independent                     Hand Dominance        Extremity/Trunk Assessment        Lower Extremity Assessment Lower Extremity Assessment: Generalized weakness    Cervical / Trunk Assessment Cervical / Trunk Assessment: Normal  Communication   Communication: No difficulties  Cognition Arousal/Alertness: Awake/alert Behavior During Therapy: Flat  affect Overall Cognitive Status: Within Functional Limits for tasks assessed                                          General Comments      Exercises     Assessment/Plan    PT Assessment Patient needs continued PT services  PT Problem List Decreased strength;Decreased mobility;Decreased activity tolerance;Decreased balance;Decreased knowledge of use of DME       PT Treatment Interventions Functional mobility training;Therapeutic activities;DME instruction;Therapeutic exercise;Patient/family education;Gait training;Balance training    PT Goals (Current goals can be found in the Care Plan section)  Acute Rehab PT Goals PT Goal Formulation: With patient Time For Goal Achievement: 04/30/22 Potential to Achieve Goals: Good    Frequency Min 3X/week     Co-evaluation               AM-PAC PT "6 Clicks" Mobility  Outcome Measure Help needed turning from your back to your side while in a flat bed without using bedrails?: None Help needed moving from lying on your back to sitting on the side of a flat bed without using bedrails?: None Help needed moving to and from a bed to a chair (including a wheelchair)?: A Little Help needed standing up from a chair using your arms (e.g., wheelchair or bedside chair)?: A Little Help needed to walk in hospital room?: A Little Help needed climbing 3-5 steps with a railing? : A Little 6 Click Score: 20    End of Session Equipment Utilized During Treatment: Gait belt Activity Tolerance: Patient tolerated treatment well Patient left: in bed;with call bell/phone within reach;with nursing/sitter in room Nurse Communication: Mobility status PT Visit Diagnosis: Other abnormalities of gait and mobility (R26.89)    Time: 3825-0539 PT Time Calculation (min) (ACUTE ONLY): 9 min   Charges:   PT Evaluation $PT Eval Low Complexity: 1 Low        Kati PT, DPT Physical Therapist Acute Rehabilitation Services Preferred  contact method: Secure Chat Weekend Pager Only: [phone] Office: [place] 04/23/2022, 3:30 PM

## 2022-04-23 NOTE — Progress Notes (Signed)
Pt tearful this am, denies suicidal ideation or want to self harm, states she just needed a cry. [facility] and mental health services offered, pt declined, sitter at bedside, door open, call bell in reach, will continue to monitor

## 2022-04-23 NOTE — TOC Progression Note (Signed)
Transition of Care [facility]) - Progression Note    Patient Details  Name: Christine Bishop MRN: [id number] Date of Birth: 1966-03-16  Transition of Care [facility]) CM/SW Contact  Angelita Ingles, RN Phone [phone]  04/23/2022, 12:59 PM  Clinical Narrative:    CM has reached out to [facility]  administrative coordinator to determine if patient is able to return. AC to review patient information and will follow up with Cm.         Expected Discharge Plan and Services                                                 Social Determinants of Health (SDOH) Interventions    Readmission Risk Interventions     No data to display

## 2022-04-23 NOTE — Discharge Summary (Addendum)
Physician Discharge Summary  Christine Bishop CVE:[id number] DOB: [birth year] DOA: 04/21/2022  PCP: Ronnell Freshwater, NP  Admit date: 04/21/2022 Discharge date: 04/23/2022  Admitted From: [facility] Disposition:  [place]  Discharge Condition: Stable CODE STATUS: Full  Diet recommendation: Regular   Brief/Interim Summary: Christine Bishop is a 56 y.o. female with medical history significant of generalized anxiety disorder, major depressive disorder who was [facility] the ED [facility] due to hypotension with blood pressure 87/56, tachycardia heart rate 107 and also complains of shortness of breath.  In the emergency department, workup revealed UTI.  She was given fluid boluses and started on Rocephin.  Admitted to the hospitalist service.  Urine culture was negative.  Chest x-ray was unremarkable.  Unclear if patient actually had a UTI or not.  Either case, vital signs have improved.  She worked with PT prior to discharge back to [facility].  Discharge Diagnoses:   Principal Problem:   Sepsis ([place]) Active Problems:   Primary insomnia   Mixed hyperlipidemia   Severe major depression without psychotic features (HCC)   Depression, recurrent ([place])   Hyperbilirubinemia   Acute lower UTI  Sepsis secondary to ?UTI, sepsis present on admission -Urine culture negative 12/2  -Blood cultures negative to date  -CXR reviewed independently, unremarkable -Unclear source. She received 2 doses IV rocephin. Ordered 1 dose fosfomycin, which patient refused.    Major depressive disorder, generalized anxiety disorder -Wellbutrin, BuSpar, escitalopram -Discharge back to [facility] for discharge   Hyperlipidemia -Crestor   Insomnia -Trazodone    Discharge Instructions   Allergies as of 04/23/2022       Reactions   Inderal [propranolol] Other (See Comments)   Dizziness Syncopal episodes "makes me manic"   Septra [sulfamethoxazole-trimethoprim] Nausea And Vomiting   Zonegran [zonisamide] Other  (See Comments)   Bipolar/ Mania symptoms    Topamax [topiramate] Anxiety, Palpitations, Other (See Comments)   Panic attacks Bi-Polar symptoms        Medication List     STOP taking these medications    nitrofurantoin (macrocrystal-monohydrate) 100 MG capsule Commonly known as: MACROBID       TAKE these medications    AZO Urinary Pain Relief 95 MG tablet Generic drug: phenazopyridine Take 95 mg by mouth 3 (three) times daily as needed for pain.   buPROPion 300 MG 24 hr tablet Commonly known as: Wellbutrin XL Take 1 tablet (300 mg total) by mouth every morning. Start on 04/07/22 What changed: Another medication with the same name was removed. Continue taking this medication, and follow the directions you see here.   busPIRone 5 MG tablet Commonly known as: BUSPAR Take 5 mg by mouth 2 (two) times daily as needed (for anxiety).   Cambia 50 MG Pack Generic drug: Diclofenac Potassium(Migraine) Take 50 mg by mouth See admin instructions. Administer one packet (50 mg) of CAMBIAT  for the acute treatment of migraine. Empty the contents of one packet into a cup containing 1 to 2 ounces (30 to 60 mL) of water, mix well and drink immediately. Do not use liquids other than water.   CombiPatch 0.05-0.14 MG/DAY Generic drug: estradiol-norethindrone Place 1 patch onto the skin See admin instructions. Apply a new patch topically on Tuesdays and Saturdays (after removal of the old one)   escitalopram 20 MG tablet Commonly known as: LEXAPRO Take 1 tablet (20 mg total) by mouth daily.   ibuprofen 200 MG tablet Commonly known as: ADVIL Take 600 mg by mouth every 6 (six)  hours as needed for headache or mild pain (or migraines).   ketorolac 0.5 % ophthalmic solution Commonly known as: ACULAR INSTILL 1 DROP INTO THE RIGHT EYE 4 TIMES A DAY AS DIRECTED What changed: See the new instructions.   PRESERVISION AREDS PO Take 1 tablet by mouth in the morning and at bedtime.    rosuvastatin 5 MG tablet Commonly known as: CRESTOR Take 5 mg by mouth daily.   Systane Complete PF 0.6 % Soln Generic drug: Propylene Glycol (PF) Place 1 drop into both eyes 4 (four) times daily.   traZODone 50 MG tablet Commonly known as: DESYREL Take 50 mg by mouth at bedtime as needed for sleep.        Allergies  Allergen Reactions   Inderal [Propranolol] Other (See Comments)    Dizziness Syncopal episodes "makes me manic"   Septra [Sulfamethoxazole-Trimethoprim] Nausea And Vomiting   Zonegran [Zonisamide] Other (See Comments)    Bipolar/ Mania symptoms    Topamax [Topiramate] Anxiety, Palpitations and Other (See Comments)    Panic attacks Bi-Polar symptoms    Consultations: Psych    Procedures/Studies: DG CHEST PORT 1 VIEW  Result Date: 04/22/2022 CLINICAL DATA:  Shortness of breath.  Hypertension. EXAM: PORTABLE CHEST 1 VIEW COMPARISON:  February 24, 2022 FINDINGS: The heart size and mediastinal contours are within normal limits. Both lungs are clear. The visualized skeletal structures are unremarkable. IMPRESSION: No active disease. Electronically Signed   By: Dorise Bullion III M.D.   On: 04/22/2022 13:33       Discharge Exam: Vitals:   04/22/22 2352 04/23/22 0619  BP: 110/64 125/77  Pulse: 94 (!) 102  Resp: 16 18  Temp: 98.3 F (36.8 C) 97.9 F (36.6 C)  SpO2: 97% 98%    General: Pt is alert, awake, not in acute distress Cardiovascular: RRR, S1/S2 +, no edema Respiratory: CTA bilaterally, no wheezing, no rhonchi, no respiratory distress, no conversational dyspnea  Abdominal: Soft, NT, ND, bowel sounds + Extremities: no edema, no cyanosis Psych: Flat affect   The results of significant diagnostics from this hospitalization (including imaging, microbiology, ancillary and laboratory) are listed below for reference.     Microbiology: Recent Results (from the past 240 hour(s))  Group A Strep by PCR     Status: None   Collection Time: 04/19/22  12:21 AM   Specimen: Throat; Sterile Swab  Result Value Ref Range Status   Group A Strep by PCR NOT DETECTED NOT DETECTED Final    Comment: Performed at [facility], [place] [address], [place],  [address]  SARS Coronavirus 2 by RT PCR (hospital order, performed in [facility] hospital lab) *cepheid single result test* Anterior Nasal Swab     Status: None   Collection Time: 04/20/22 12:41 PM   Specimen: Anterior Nasal Swab  Result Value Ref Range Status   SARS Coronavirus 2 by RT PCR NEGATIVE NEGATIVE Final    Comment: (NOTE) SARS-CoV-2 target nucleic acids are NOT DETECTED.  The SARS-CoV-2 RNA is generally detectable in upper and lower respiratory specimens during the acute phase of infection. The lowest concentration of SARS-CoV-2 viral copies this assay can detect is 250 copies / mL. A negative result does not preclude SARS-CoV-2 infection and should not be used as the sole basis for treatment or other patient management decisions.  A negative result may occur with improper specimen collection / handling, submission of specimen other than nasopharyngeal swab, presence of viral mutation(s) within the areas targeted by this assay,  and inadequate number of viral copies (<250 copies / mL). A negative result must be combined with clinical observations, patient history, and epidemiological information.  Fact Sheet for Patients:   https://www.patel.info/  Fact Sheet for Healthcare Providers: https://hall.com/  This test is not yet approved or  cleared by the [place] FDA and has been authorized for detection and/or diagnosis of SARS-CoV-2 by FDA under an Emergency Use Authorization (EUA).  This EUA will remain in effect (meaning this test can be used) for the duration of the COVID-19 declaration under Section 564(b)(1) of the Act, 21 U.S.C. section 360bbb-3(b)(1), unless the authorization is terminated or revoked  sooner.  Performed at [facility], [place] [address] [place], [place] [address]   Urine Culture     Status: Abnormal   Collection Time: 04/21/22  1:00 AM   Specimen: Urine, Clean Catch  Result Value Ref Range Status   Specimen Description   Final    URINE, CLEAN CATCH Performed at [facility], [place] [address], [place], [place] [address]    Special Requests   Final    NONE Performed at [facility], [place] [address] [place], [place] [address]    Culture (A)  Final    <10,000 COLONIES/mL INSIGNIFICANT GROWTH Performed at [place] [address], [place], [place] [address]    Report Status 04/22/2022 FINAL  Final  Blood culture (routine x 2)     Status: None (Preliminary result)   Collection Time: 04/21/22  7:39 AM   Specimen: BLOOD LEFT HAND  Result Value Ref Range Status   Specimen Description   Final    BLOOD LEFT HAND Performed at [facility], [place] [address] [place], [place] [address]    Special Requests   Final    BOTTLES DRAWN AEROBIC ONLY Blood Culture adequate volume Performed at [place] [address], [place], [place] [address]    Culture   Final    NO GROWTH 2 DAYS Performed at [place] [address] [place], [place] [address]    Report Status PENDING  Incomplete  Blood culture (routine x 2)     Status: None (Preliminary result)   Collection Time: 04/21/22  7:39 AM   Specimen: BLOOD  Result Value Ref Range Status   Specimen Description   Final    BLOOD RIGHT ANTECUBITAL Performed at [place] [address] [place], [place] [address]    Special Requests   Final    BOTTLES DRAWN AEROBIC ONLY Blood Culture adequate volume Performed at [place] [address] [place], [place] [address]    Culture   Final    NO GROWTH 2 DAYS Performed at [place] [address], [place], [place] [address]     Report Status PENDING  Incomplete     Labs: BNP (last 3 results) No results for input(s): "BNP" in the last 8760 hours. Basic Metabolic Panel: Recent Labs  Lab 04/19/22 2131 04/20/22 0200 04/20/22 2330 04/22/22 0541 04/23/22 0436  NA 139 138 139 144 142  K 2.6* 3.3* 3.5 3.4* 3.6  CL 102 103 106 112* 110  CO2 27 27 19* 25 25  GLUCOSE 82 83 82 83 88  BUN 21* '17 17 8 '$ <5*  CREATININE 0.64 0.61 0.85 0.56 0.59  CALCIUM 8.7* 8.0* 8.7* 7.8* 8.2*  MG 1.9  --   --   --  1.8  Liver Function Tests: Recent Labs  Lab 04/19/22 2131 04/20/22 2330 04/22/22 0541  AST 27 40 22  ALT '26 31 23  '$ ALKPHOS 47 51 44  BILITOT 1.1 1.4* 0.5  PROT 6.3* 6.4* 5.1*  ALBUMIN 3.7 3.6 2.9*   No results for input(s): "LIPASE", "AMYLASE" in the last 168 hours. No results for input(s): "AMMONIA" in the last 168 hours. CBC: Recent Labs  Lab 04/19/22 2131 04/20/22 2330 04/22/22 0541  WBC 5.7 6.9 4.1  NEUTROABS 3.1 4.8 2.2  HGB 13.3 13.4 10.8*  HCT 38.8 40.5 34.0*  MCV 93.9 96.2 99.4  PLT 126* 126* 113*   Cardiac Enzymes: No results for input(s): "CKTOTAL", "CKMB", "CKMBINDEX", "TROPONINI" in the last 168 hours. BNP: Invalid input(s): "POCBNP" CBG: Recent Labs  Lab 04/20/22 1735  GLUCAP 86   D-Dimer No results for input(s): "DDIMER" in the last 72 hours. Hgb A1c No results for input(s): "HGBA1C" in the last 72 hours. Lipid Profile No results for input(s): "CHOL", "HDL", "LDLCALC", "TRIG", "CHOLHDL", "LDLDIRECT" in the last 72 hours. Thyroid function studies No results for input(s): "TSH", "T4TOTAL", "T3FREE", "THYROIDAB" in the last 72 hours.  Invalid input(s): "FREET3" Anemia work up No results for input(s): "VITAMINB12", "FOLATE", "FERRITIN", "TIBC", "IRON", "RETICCTPCT" in the last 72 hours. Urinalysis    Component Value Date/Time   COLORURINE YELLOW (A) 04/21/2022 0100   APPEARANCEUR CLEAR (A) 04/21/2022 0100   LABSPEC 1.015 04/21/2022 0100   PHURINE 7.0 04/21/2022 0100    GLUCOSEU NEGATIVE 04/21/2022 0100   HGBUR NEGATIVE 04/21/2022 0100   HGBUR moderate 08/06/2010 1129   BILIRUBINUR SMALL (A) 04/21/2022 0100   BILIRUBINUR small (A) 03/20/2022 0945   KETONESUR 40 (A) 04/21/2022 0100   PROTEINUR TRACE (A) 04/21/2022 0100   UROBILINOGEN 0.2 03/20/2022 0945   UROBILINOGEN 0.2 08/06/2010 1129   NITRITE NEGATIVE 04/21/2022 0100   LEUKOCYTESUR TRACE (A) 04/21/2022 0100   Sepsis Labs Recent Labs  Lab 04/19/22 2131 04/20/22 2330 04/22/22 0541  WBC 5.7 6.9 4.1   Microbiology Recent Results (from the past 240 hour(s))  Group A Strep by PCR     Status: None   Collection Time: 04/19/22 12:21 AM   Specimen: Throat; Sterile Swab  Result Value Ref Range Status   Group A Strep by PCR NOT DETECTED NOT DETECTED Final    Comment: Performed at [facility], [place] [address] [place], [place] [address]  SARS Coronavirus 2 by RT PCR (hospital order, performed in [facility] hospital lab) *cepheid single result test* Anterior Nasal Swab     Status: None   Collection Time: 04/20/22 12:41 PM   Specimen: Anterior Nasal Swab  Result Value Ref Range Status   SARS Coronavirus 2 by RT PCR NEGATIVE NEGATIVE Final    Comment: (NOTE) SARS-CoV-2 target nucleic acids are NOT DETECTED.  The SARS-CoV-2 RNA is generally detectable in upper and lower respiratory specimens during the acute phase of infection. The lowest concentration of SARS-CoV-2 viral copies this assay can detect is 250 copies / mL. A negative result does not preclude SARS-CoV-2 infection and should not be used as the sole basis for treatment or other patient management decisions.  A negative result may occur with improper specimen collection / handling, submission of specimen other than nasopharyngeal swab, presence of viral mutation(s) within the areas targeted by this assay, and inadequate number of viral copies (<250 copies / mL). A negative result must be combined with  clinical observations, patient history, and epidemiological information.  Fact Sheet for  Patients:   https://www.patel.info/  Fact Sheet for Healthcare Providers: https://hall.com/  This test is not yet approved or  cleared by the [place] FDA and has been authorized for detection and/or diagnosis of SARS-CoV-2 by FDA under an Emergency Use Authorization (EUA).  This EUA will remain in effect (meaning this test can be used) for the duration of the COVID-19 declaration under Section 564(b)(1) of the Act, 21 U.S.C. section 360bbb-3(b)(1), unless the authorization is terminated or revoked sooner.  Performed at [facility], [place] [address] [place], [place] [address]   Urine Culture     Status: Abnormal   Collection Time: 04/21/22  1:00 AM   Specimen: Urine, Clean Catch  Result Value Ref Range Status   Specimen Description   Final    URINE, CLEAN CATCH Performed at [facility], [place] [address] [place], [place] [address]    Special Requests   Final    NONE Performed at [facility], [place] [address] [place], [place] [address]    Culture (A)  Final    <10,000 COLONIES/mL INSIGNIFICANT GROWTH Performed at [place] [address], [place], [place] [address]    Report Status 04/22/2022 FINAL  Final  Blood culture (routine x 2)     Status: None (Preliminary result)   Collection Time: 04/21/22  7:39 AM   Specimen: BLOOD LEFT HAND  Result Value Ref Range Status   Specimen Description   Final    BLOOD LEFT HAND Performed at [facility], [place] [address] [place], [place] [address]    Special Requests   Final    BOTTLES DRAWN AEROBIC ONLY Blood Culture adequate volume Performed at [place] [address], [place], [place] [address]    Culture   Final    NO GROWTH 2 DAYS Performed at [place] [address] [place],  [place] [address]    Report Status PENDING  Incomplete  Blood culture (routine x 2)     Status: None (Preliminary result)   Collection Time: 04/21/22  7:39 AM   Specimen: BLOOD  Result Value Ref Range Status   Specimen Description   Final    BLOOD RIGHT ANTECUBITAL Performed at [place] [address] [place], [place] [address]    Special Requests   Final    BOTTLES DRAWN AEROBIC ONLY Blood Culture adequate volume Performed at [place] [address] [place], [place] [address]    Culture   Final    NO GROWTH 2 DAYS Performed at [place] [address], [place], [place] [address]    Report Status PENDING  Incomplete     Patient was seen and examined on the day of discharge and was found to be in stable condition. Time coordinating discharge: 25 minutes including assessment and coordination of care, as well as examination of the patient.   SIGNED:  Dessa Phi, DO [facility] 04/23/2022, 2:39 PM

## 2022-04-23 NOTE — Consult Note (Signed)
[facility] Face-to-Face Psychiatry Consult   Reason for Consult:  Depression Referring Physician:  Dessa Phi, DO Patient Identification: Christine Bishop MRN:  [id number] Principal Diagnosis: Sepsis [facility]) Diagnosis:  Principal Problem:   Sepsis ([place]) Active Problems:   Primary insomnia   Mixed hyperlipidemia   Severe major depression without psychotic features ([place])   Depression, recurrent ([place])   Hyperbilirubinemia   Acute lower UTI   Total Time spent with patient: 1 hour  Subjective:   Christine Bishop is a 56 y.o. female patient admitted with anxiety/depression.  HPI:  Christine Bishop is a 56 y.o. female who carries the diagnoses for Generalized anxiety disorder, major depressive disorder who was admitted to [facility] due to worsening depression, suicidal ideation( passive), and recurrent anxiety attacks. She was sent to the ED from behavioral health hospital due to hypotension with blood pressure 87/56, tachycardia heart rate 107 and also complains of shortness of breath.   Today, patient is awake, alert, calm, cooperative, oriented x 4.  Patient will present with worsening anxiety and depression, since starting her Lexapro in October.  Furthermore she reports worsening neuropsychiatric symptoms to include increased weakness, difficulty walking, bowel and bladder incontinence.also when completing physical exam patient was noted to have increasing oral spasticity, she also contributes to worsening since 6 weeks ago.  She continues to identify worsening depressive symptoms that include decreased energy, lack of motivation, hopelessness, passive suicidal ideation, and sadness.  She does deny any active and or passive suicidal ideations at this time, last suicidal ideation was on Friday prior to admission.  Patient still meets criteria for [facility] inpatient psychiatric admission after she is medically  cleared.  Past Psychiatric History: Depression, Anxiety, and psychosis. Two recent  hospitalizations [facility], 08/2021 for delusions; and [facility] 02/2022 for anxiety and depression. Currently taking Lexapro, Buspar, Trazodone, Wellbutrin.   Risk to Self:  No Risk to Others:  Denies Prior Inpatient Therapy:  [facility] and [facility] Prior Outpatient Therapy:  Yes  Past Medical History:  Past Medical History:  Diagnosis Date   Anxiety    Dysplastic nevus 12/24/2019   R upper back paraspinal - moderate   Dysplastic nevus 12/24/2019   R to mid upper back 3.0 cm lat to spine above braline - mild   History of migraine headaches    Posterior vitreous detachment of left eye 12/14/2019   Vitreous membranes and strands, left 04/20/2020   Vitrectomy left eye 04-27-20    Past Surgical History:  Procedure Laterality Date   CHOLECYSTECTOMY OPEN     LASIK     LEEP     Family History:  Family History  Problem Relation Age of Onset   Cancer Mother    Hypertension Father    Hyperlipidemia Father    Cancer Maternal Grandmother    Family Psychiatric  History: Denies Social History:  Social History   Substance and Sexual Activity  Alcohol Use No     Social History   Substance and Sexual Activity  Drug Use No    Social History   Socioeconomic History   Marital status: Married    Spouse name: Not on file   Number of children: 2   Years of education: Not on file   Highest education level: Not on file  Occupational History   Not on file  Tobacco Use   Smoking status: Former    Types: Cigarettes    Quit date: 06/07/2011    Years since quitting: 10.8    Passive exposure:  Past   Smokeless tobacco: Never   Tobacco comments:    No smoking, does not need nicotine patch or gum  Vaping Use   Vaping Use: Never used  Substance and Sexual Activity   Alcohol use: No   Drug use: No   Sexual activity: Yes    Birth control/protection: None  Other Topics Concern   Not on file  Social History Narrative   Not on file   Social Determinants of Health   Financial  Resource Strain: Not on file  Food Insecurity: No Food Insecurity (04/21/2022)   Hunger Vital Sign    Worried About Running Out of Food in the Last Year: Never true    Ran Out of Food in the Last Year: Never true  Transportation Needs: No Transportation Needs (04/21/2022)   PRAPARE - Hydrologist (Medical): No    Lack of Transportation (Non-Medical): No  Physical Activity: Not on file  Stress: Not on file  Social Connections: Not on file   Additional Social History:    Allergies:   Allergies  Allergen Reactions   Inderal [Propranolol] Other (See Comments)    Dizziness Syncopal episodes "makes me manic"   Septra [Sulfamethoxazole-Trimethoprim] Nausea And Vomiting   Zonegran [Zonisamide] Other (See Comments)    Bipolar/ Mania symptoms    Topamax [Topiramate] Anxiety, Palpitations and Other (See Comments)    Panic attacks Bi-Polar symptoms    Labs:  Results for orders placed or performed during the hospital encounter of 04/21/22 (from the past 48 hour(s))  CBC with Differential     Status: Abnormal   Collection Time: 04/22/22  5:41 AM  Result Value Ref Range   WBC 4.1 4.0 - 10.5 K/uL   RBC 3.42 (L) 3.87 - 5.11 MIL/uL   Hemoglobin 10.8 (L) 12.0 - 15.0 g/dL   HCT 34.0 (L) 36.0 - 46.0 %   MCV 99.4 80.0 - 100.0 fL   MCH 31.6 26.0 - 34.0 pg   MCHC 31.8 30.0 - 36.0 g/dL   RDW 15.6 (H) 11.5 - 15.5 %   Platelets 113 (L) 150 - 400 K/uL   nRBC 0.0 0.0 - 0.2 %   Neutrophils Relative % 53 %   Neutro Abs 2.2 1.7 - 7.7 K/uL   Lymphocytes Relative 30 %   Lymphs Abs 1.2 0.7 - 4.0 K/uL   Monocytes Relative 13 %   Monocytes Absolute 0.6 0.1 - 1.0 K/uL   Eosinophils Relative 2 %   Eosinophils Absolute 0.1 0.0 - 0.5 K/uL   Basophils Relative 1 %   Basophils Absolute 0.0 0.0 - 0.1 K/uL   Immature Granulocytes 1 %   Abs Immature Granulocytes 0.02 0.00 - 0.07 K/uL    Comment: Performed at [facility], [place] [address] [place], [place]  [address]  Comprehensive metabolic panel     Status: Abnormal   Collection Time: 04/22/22  5:41 AM  Result Value Ref Range   Sodium 144 135 - 145 mmol/L   Potassium 3.4 (L) 3.5 - 5.1 mmol/L   Chloride 112 (H) 98 - 111 mmol/L   CO2 25 22 - 32 mmol/L   Glucose, Bld 83 70 - 99 mg/dL    Comment: Glucose reference range applies only to samples taken after fasting for at least 8 hours.   BUN 8 6 - 20 mg/dL   Creatinine, Ser 0.56 0.44 - 1.00 mg/dL   Calcium 7.8 (L) 8.9 - 10.3 mg/dL   Total Protein 5.1 (  L) 6.5 - 8.1 g/dL   Albumin 2.9 (L) 3.5 - 5.0 g/dL   AST 22 15 - 41 U/L   ALT 23 0 - 44 U/L   Alkaline Phosphatase 44 38 - 126 U/L   Total Bilirubin 0.5 0.3 - 1.2 mg/dL   GFR, Estimated >60 >60 mL/min    Comment: (NOTE) Calculated using the CKD-EPI Creatinine Equation (2021)    Anion gap 7 5 - 15    Comment: Performed at [facility], [place] [address] [place], [place] [address]  HIV Antibody (routine testing w rflx)     Status: None   Collection Time: 04/22/22  5:41 AM  Result Value Ref Range   HIV Screen 4th Generation wRfx Non Reactive Non Reactive    Comment: Performed at [facility], [place] [address], [place], [place] [address]  Basic metabolic panel     Status: Abnormal   Collection Time: 04/23/22  4:36 AM  Result Value Ref Range   Sodium 142 135 - 145 mmol/L   Potassium 3.6 3.5 - 5.1 mmol/L   Chloride 110 98 - 111 mmol/L   CO2 25 22 - 32 mmol/L   Glucose, Bld 88 70 - 99 mg/dL    Comment: Glucose reference range applies only to samples taken after fasting for at least 8 hours.   BUN <5 (L) 6 - 20 mg/dL   Creatinine, Ser 0.59 0.44 - 1.00 mg/dL   Calcium 8.2 (L) 8.9 - 10.3 mg/dL   GFR, Estimated >60 >60 mL/min    Comment: (NOTE) Calculated using the CKD-EPI Creatinine Equation (2021)    Anion gap 7 5 - 15    Comment: Performed at [facility], [place] [address], [place], [place] [address]  Magnesium     Status: None   Collection Time:  04/23/22  4:36 AM  Result Value Ref Range   Magnesium 1.8 1.7 - 2.4 mg/dL    Comment: Performed at [facility], [place] [address] [place], [place] [address]    Current Facility-Administered Medications  Medication Dose Route Frequency Provider Last Rate Last Admin   acetaminophen (TYLENOL) tablet 650 mg  650 mg Oral Q6H PRN Reubin Milan, MD       Or   acetaminophen (TYLENOL) suppository 650 mg  650 mg Rectal Q6H PRN Reubin Milan, MD       buPROPion (WELLBUTRIN XL) 24 hr tablet 300 mg  300 mg Oral Daily Reubin Milan, MD   300 mg at 04/23/22 1119   busPIRone (BUSPAR) tablet 5 mg  5 mg Oral BID Reubin Milan, MD   5 mg at 04/23/22 1119   enoxaparin (LOVENOX) injection 40 mg  40 mg Subcutaneous Q24H Reubin Milan, MD   40 mg at 04/22/22 2122   escitalopram (LEXAPRO) tablet 20 mg  20 mg Oral Daily Reubin Milan, MD   20 mg at 04/23/22 1119   fosfomycin (MONUROL) packet 3 g  3 g Oral Once Dessa Phi, DO       ondansetron [facility]) tablet 4 mg  4 mg Oral Q6H PRN Reubin Milan, MD       Or   ondansetron [facility]) injection 4 mg  4 mg Intravenous Q6H PRN Reubin Milan, MD   4 mg at 04/21/22 2004   rosuvastatin (CRESTOR) tablet 5 mg  5 mg Oral Daily Reubin Milan, MD   5 mg at 04/23/22 1119   traZODone (DESYREL) tablet  50 mg  50 mg Oral QHS PRN Reubin Milan, MD        Musculoskeletal: Strength & Muscle Tone: within normal limits Gait & Station:  not assessed Patient leans: N/A    Psychiatric Specialty Exam:  Presentation  General Appearance:  Appropriate for Environment  Eye Contact: Good  Speech: Clear and Coherent  Speech Volume: Decreased  Handedness: Right   Mood and Affect  Mood: Depressed  Affect: Congruent   Thought Process  Thought Processes: Linear; Goal Directed  Descriptions of Associations:Intact  Orientation:Full (Time, Place and Person)  Thought  Content:Logical  History of Schizophrenia/Schizoaffective disorder:No data recorded Duration of Psychotic Symptoms:Less than six months  Hallucinations:Hallucinations: None  Ideas of Reference:None  Suicidal Thoughts:Suicidal Thoughts: Yes, Active SI Active Intent and/or Plan: With Intent  Homicidal Thoughts:Homicidal Thoughts: No   Sensorium  Memory: Immediate Good; Recent Good; Remote Good  Judgment: Intact  Insight: Poor   Executive Functions  Concentration: Good  Attention Span: Good  Recall: Good  Fund of Knowledge: Good  Language: Good   Psychomotor Activity  Psychomotor Activity: Psychomotor Activity: Decreased   Assets  Assets: Communication Skills; Desire for Improvement; Social Support   Sleep  Sleep: Sleep: Fair   Physical Exam: Physical Exam Review of Systems  Psychiatric/Behavioral:  Positive for depression and suicidal ideas.    Blood pressure 125/77, pulse (!) 102, temperature 97.9 F (36.6 C), temperature source Oral, resp. rate 18, height '5\' 6"'$  (1.676 m), weight 65.4 kg, SpO2 98 %. Body mass index is 23.27 kg/m.  Treatment Plan Summary: 56 y/o female who was admitted to [facility] for treatment of Anxiety and depression. She was transferred to the ED from behavioral health hospital due to hypotension and tachycardia. However, patient reports ongoing anxiety, depression, passive suicidal thoughts. As a result, she will benefit from admission to [facility] after she is medically stabilized.  Plan/Recommendations: -Designer, jewellery, she denies suicidal ideation does not meet criteria for IVC at this time.  -Continue Lexapro, Trazodone,Buspar as currently prescribed for anxiety/depression. Will dc Wellbutrin at this time, increase in neuropsychiatric symptoms since initiation. May beenfit from atypical antipsychotic for augmentation of depression symptoms and delusions associated with underlying illness.  -Recommend neurology follow up, for  MRI in 2019 with Periventricular white matter changes bilaterally are mildly advanced for age. The finding is nonspecific but can be seen in the setting of chronic microvascular ischemia, a demyelinating process such as multiple sclerosis, vasculitis, complicated migraine headaches, or as the sequelae of a prior infectious or inflammatory process. -Social worker to facilitate patient's transfer back to [facility] after she is medically stabilized  Disposition: Recommend psychiatric Inpatient admission when medically cleared. Supportive therapy provided about ongoing stressors. Psychiatric consult service will continue to follow this patient  Suella Broad, FNP 04/23/2022 2:03 PM

## 2022-04-24 ENCOUNTER — Other Ambulatory Visit (HOSPITAL_COMMUNITY): Payer: Self-pay | Admitting: Psychiatry

## 2022-04-24 DIAGNOSIS — F321 Major depressive disorder, single episode, moderate: Secondary | ICD-10-CM

## 2022-04-24 DIAGNOSIS — F331 Major depressive disorder, recurrent, moderate: Secondary | ICD-10-CM | POA: Diagnosis not present

## 2022-04-24 NOTE — Progress Notes (Signed)
Mobility Specialist - Progress Note   04/24/22 1142  Mobility  Activity Ambulated with assistance in hallway  Level of Assistance Standby assist, set-up cues, supervision of patient - no hands on  Assistive Device None  Distance Ambulated (ft) 75 ft  Activity Response Tolerated well  Mobility Referral Yes  $Mobility charge 1 Mobility   Pt received in hallway while sitter and pt were walking, pt felt to have heavy, weak legs. Pt claimed it has been that way for some time. Pt returned to bed where we did one sit to stand after ambulation and legs were shaking. Pt returned fully in bed with all needs met and sitter in room.   Roderick Pee Mobility Specialist

## 2022-04-24 NOTE — TOC Progression Note (Addendum)
Transition of Care [facility]) - Progression Note    Patient Details  Name: Christine Bishop MRN: [id number] Date of Birth: 01-27-1966  Transition of Care [facility] CM/SW [place], RN Phone [phone]  04/24/2022, 8:25 AM  Clinical Narrative:    TOC continues to follow. Patient can not be admitted to [facility] until she is totally independent with ADL's per [facility]. Per updates on 12/4 patient noted to have weakness and ambulating with walker. CM following.   1400 Per Lynnda Shields Rn at [facility] she is currently in staffing and does not have admit capacity. CM to reach out too other facilities for inpatient behavioral health bed referral.   1545 Inpatient psyh referral has been faxed to [place] per [facility] LCSW .          Expected Discharge Plan and Services           Expected Discharge Date: 04/23/22                                     Social Determinants of Health (SDOH) Interventions    Readmission Risk Interventions     No data to display

## 2022-04-24 NOTE — Progress Notes (Signed)
Physical Therapy Treatment Patient Details Name: Christine Bishop MRN: [id number] DOB: 1966/04/14 Today's Date: 04/24/2022   History of Present Illness 56 y.o. female with medical history significant of generalized anxiety disorder, major depressive disorder who was sent to the ED from behavioral health hospital due to hypotension with blood pressure 87/56, tachycardia heart rate 107 and also complains of shortness of breath.  In the emergency department, workup revealed UTI.    PT Comments    Pt reports LE weakness on arrival to room so utilized RW at beginning of ambulation for confidence and pt able to return to room without assistive device (see mobility section below).  Pt educated on ankle pumps, LAQs and marching exercises in sitting to perform 10 repetitions 2-3 a day.  Again pt was able to ambulate without assistive device and no unsteadiness or LOB observed.   Recommendations for follow up therapy are one component of a multi-disciplinary discharge planning process, led by the attending physician.  Recommendations may be updated based on patient status, additional functional criteria and insurance authorization.  Follow Up Recommendations  No PT follow up     Assistance Recommended at Discharge PRN  Patient can return home with the following A little help with walking and/or transfers   Equipment Recommendations  None recommended by PT    Recommendations for Other Services       Precautions / Restrictions Precautions Precautions: Fall     Mobility  Bed Mobility Overal bed mobility: Modified Independent                  Transfers Overall transfer level: Modified independent                      Ambulation/Gait Ambulation/Gait assistance: Supervision, Min guard Gait Distance (Feet): 300 Feet Assistive device: Rolling walker (2 wheels) Gait Pattern/deviations: Step-through pattern, Decreased stride length, Narrow base of support Gait velocity:  decr     General Gait Details: started with RW for pt comfort for 150 ft and then pt able to ambulate back to room without assistive device, only slow pace and decreased arm swing (therapist did hold gait belt but mainly for pt confidence and reassurance)   Stairs             Wheelchair Mobility    Modified Rankin (Stroke Patients Only)       Balance Overall balance assessment: No apparent balance deficits (not formally assessed)                             High Level Balance Comments: pt able to start/stop and look right and left during ambulation without assistive device            Cognition Arousal/Alertness: Awake/alert Behavior During Therapy: Flat affect Overall Cognitive Status: Within Functional Limits for tasks assessed                                          Exercises      General Comments        Pertinent Vitals/Pain Pain Assessment Pain Assessment: No/denies pain    Home Living                          Prior Function  PT Goals (current goals can now be found in the care plan section) Progress towards PT goals: Progressing toward goals    Frequency    Min 3X/week      PT Plan Current plan remains appropriate    Co-evaluation              AM-PAC PT "6 Clicks" Mobility   Outcome Measure  Help needed turning from your back to your side while in a flat bed without using bedrails?: None Help needed moving from lying on your back to sitting on the side of a flat bed without using bedrails?: None Help needed moving to and from a bed to a chair (including a wheelchair)?: None Help needed standing up from a chair using your arms (e.g., wheelchair or bedside chair)?: None Help needed to walk in hospital room?: A Little Help needed climbing 3-5 steps with a railing? : A Little 6 Click Score: 22    End of Session Equipment Utilized During Treatment: Gait belt Activity Tolerance:  Patient tolerated treatment well Patient left: in bed;with call bell/phone within reach;with nursing/sitter in room   PT Visit Diagnosis: Other abnormalities of gait and mobility (R26.89)     Time: 2233-6122 PT Time Calculation (min) (ACUTE ONLY): 13 min  Charges:  $Gait Training: 8-22 mins                     Arlyce Dice, DPT Physical Therapist Acute Rehabilitation Services Preferred contact method: Secure Chat Weekend Pager Only: [phone] Office: [phone]    Christine Bishop [place] 04/24/2022, 2:14 PM

## 2022-04-24 NOTE — Progress Notes (Signed)
  PROGRESS NOTE  Patient remains medically stable to discharge back to [facility]. Asked physical therapy to reevaluate patient for mobility.  Apparently [facility] cannot take patient using a walker.  Also TOC aware possible need for referral to [facility]   Dessa Phi, DO [facility] 04/24/2022, 12:00 PM  Available via Epic secure chat 7am-7pm After these hours, please refer to coverage provider listed on [email or web address]

## 2022-04-24 NOTE — Consult Note (Signed)
[facility] Face-to-Face Psychiatry Consult   Reason for Consult:  Depression Referring Physician:  Dessa Phi, DO Patient Identification: Christine Bishop MRN:  [id number] Principal Diagnosis: Sepsis [facility]) Diagnosis:  Principal Problem:   Sepsis ([place]) Active Problems:   Primary insomnia   Mixed hyperlipidemia   Severe major depression without psychotic features ([place])   Depression, recurrent ([place])   Hyperbilirubinemia   Acute lower UTI   Total Time spent with patient: 1 hour  Subjective:   Christine Bishop is a 56 y.o. female patient admitted with anxiety/depression.  HPI:  Christine Bishop is a 56 y.o. female who carries the diagnoses for Generalized anxiety disorder, major depressive disorder who was admitted to [facility] due to worsening depression, suicidal ideation( passive), and recurrent anxiety attacks. She was sent to the ED from behavioral health hospital due to hypotension with blood pressure 87/56, tachycardia heart rate 107 and also complains of shortness of breath.  During evaluation Christine Bishop is lying in the hospital bed. She is alert/oriented x 4; calm/cooperative; and mood congruent with affect.  Patient is speaking in a clear tone at moderate volume, and normal pace; with good eye contact.  Her thought process is coherent and relevant. She remains compliant with her medication at this time,. She denies any withdraw symptoms from discontinuation of her Wellbutrin. She further denies any worsening anxiety, headaches, irritability, and myalgias.   There is no indication that she is currently responding to internal/external stimuli or experiencing delusional thought content.  Patient denies suicidal/self-harm/homicidal ideation, psychosis, and paranoia.  Patient has remained calm throughout assessment and has answered questions appropriately.  Her current findings are congruent with information received. She continues to meet inpatient psychiatric criteria. She has been  referred out to other inpatient psychiatric facilities at this time.   Past Psychiatric History: Depression, Anxiety, and psychosis. Two recent hospitalizations [facility], 08/2021 for delusions; and [facility] 02/2022 for anxiety and depression. Currently taking Lexapro, Buspar, Trazodone, Wellbutrin.   Risk to Self:  No Risk to Others:  Denies Prior Inpatient Therapy:  [facility] and [facility] Prior Outpatient Therapy:  Yes  Past Medical History:  Past Medical History:  Diagnosis Date   Anxiety    Dysplastic nevus 12/24/2019   R upper back paraspinal - moderate   Dysplastic nevus 12/24/2019   R to mid upper back 3.0 cm lat to spine above braline - mild   History of migraine headaches    Posterior vitreous detachment of left eye 12/14/2019   Vitreous membranes and strands, left 04/20/2020   Vitrectomy left eye 04-27-20    Past Surgical History:  Procedure Laterality Date   CHOLECYSTECTOMY OPEN     LASIK     LEEP     Family History:  Family History  Problem Relation Age of Onset   Cancer Mother    Hypertension Father    Hyperlipidemia Father    Cancer Maternal Grandmother    Family Psychiatric  History: Denies Social History:  Social History   Substance and Sexual Activity  Alcohol Use No     Social History   Substance and Sexual Activity  Drug Use No    Social History   Socioeconomic History   Marital status: Married    Spouse name: Not on file   Number of children: 2   Years of education: Not on file   Highest education level: Not on file  Occupational History   Not on file  Tobacco Use   Smoking status: Former  Types: Cigarettes    Quit date: 06/07/2011    Years since quitting: 10.8    Passive exposure: Past   Smokeless tobacco: Never   Tobacco comments:    No smoking, does not need nicotine patch or gum  Vaping Use   Vaping Use: Never used  Substance and Sexual Activity   Alcohol use: No   Drug use: No   Sexual activity: Yes    Birth  control/protection: None  Other Topics Concern   Not on file  Social History Narrative   Not on file   Social Determinants of Health   Financial Resource Strain: Not on file  Food Insecurity: No Food Insecurity (04/21/2022)   Hunger Vital Sign    Worried About Running Out of Food in the Last Year: Never true    Ran Out of Food in the Last Year: Never true  Transportation Needs: No Transportation Needs (04/21/2022)   PRAPARE - Hydrologist (Medical): No    Lack of Transportation (Non-Medical): No  Physical Activity: Not on file  Stress: Not on file  Social Connections: Not on file   Additional Social History:    Allergies:   Allergies  Allergen Reactions   Inderal [Propranolol] Other (See Comments)    Dizziness Syncopal episodes "makes me manic"   Septra [Sulfamethoxazole-Trimethoprim] Nausea And Vomiting   Zonegran [Zonisamide] Other (See Comments)    Bipolar/ Mania symptoms    Topamax [Topiramate] Anxiety, Palpitations and Other (See Comments)    Panic attacks Bi-Polar symptoms    Labs:  Results for orders placed or performed during the hospital encounter of 04/21/22 (from the past 48 hour(s))  Basic metabolic panel     Status: Abnormal   Collection Time: 04/23/22  4:36 AM  Result Value Ref Range   Sodium 142 135 - 145 mmol/L   Potassium 3.6 3.5 - 5.1 mmol/L   Chloride 110 98 - 111 mmol/L   CO2 25 22 - 32 mmol/L   Glucose, Bld 88 70 - 99 mg/dL    Comment: Glucose reference range applies only to samples taken after fasting for at least 8 hours.   BUN <5 (L) 6 - 20 mg/dL   Creatinine, Ser 0.59 0.44 - 1.00 mg/dL   Calcium 8.2 (L) 8.9 - 10.3 mg/dL   GFR, Estimated >60 >60 mL/min    Comment: (NOTE) Calculated using the CKD-EPI Creatinine Equation (2021)    Anion gap 7 5 - 15    Comment: Performed at [facility], [place] [address] [place], [place] [address]  Magnesium     Status: None   Collection Time: 04/23/22  4:36  AM  Result Value Ref Range   Magnesium 1.8 1.7 - 2.4 mg/dL    Comment: Performed at [facility], [place] [address], [place], [place] [address]    Current Facility-Administered Medications  Medication Dose Route Frequency Provider Last Rate Last Admin   acetaminophen (TYLENOL) tablet 650 mg  650 mg Oral Q6H PRN Reubin Milan, MD       Or   acetaminophen (TYLENOL) suppository 650 mg  650 mg Rectal Q6H PRN Reubin Milan, MD       busPIRone (BUSPAR) tablet 5 mg  5 mg Oral BID Reubin Milan, MD   5 mg at 04/24/22 1041   enoxaparin (LOVENOX) injection 40 mg  40 mg Subcutaneous Q24H Reubin Milan, MD   40 mg at 04/23/22 2118   escitalopram (LEXAPRO) tablet  20 mg  20 mg Oral Daily Reubin Milan, MD   20 mg at 04/24/22 1041   fosfomycin (MONUROL) packet 3 g  3 g Oral Once Dessa Phi, DO       ondansetron [facility]) tablet 4 mg  4 mg Oral Q6H PRN Reubin Milan, MD       Or   ondansetron [facility]) injection 4 mg  4 mg Intravenous Q6H PRN Reubin Milan, MD   4 mg at 04/21/22 2004   rosuvastatin (CRESTOR) tablet 5 mg  5 mg Oral Daily Reubin Milan, MD   5 mg at 04/24/22 1041   traZODone (DESYREL) tablet 50 mg  50 mg Oral QHS PRN Reubin Milan, MD        Musculoskeletal: Strength & Muscle Tone: within normal limits Gait & Station:  not assessed Patient leans: N/A    Psychiatric Specialty Exam:  Presentation  General Appearance:  Appropriate for Environment  Eye Contact: Good  Speech: Clear and Coherent  Speech Volume: Decreased  Handedness: Right   Mood and Affect  Mood: Depressed  Affect: Congruent   Thought Process  Thought Processes: Linear; Goal Directed  Descriptions of Associations:Intact  Orientation:Full (Time, Place and Person)  Thought Content:Logical  History of Schizophrenia/Schizoaffective disorder:No data recorded Duration of Psychotic Symptoms:Less than six  months  Hallucinations:No data recorded  Ideas of Reference:None  Suicidal Thoughts:No data recorded  Homicidal Thoughts:No data recorded   Sensorium  Memory: Immediate Good; Recent Good; Remote Good  Judgment: Intact  Insight: Poor   Executive Functions  Concentration: Good  Attention Span: Good  Recall: Good  Fund of Knowledge: Good  Language: Good   Psychomotor Activity  Psychomotor Activity: No data recorded   Assets  Assets: Communication Skills; Desire for Improvement; Social Support   Sleep  Sleep: No data recorded   Physical Exam: Physical Exam Vitals and nursing note reviewed.  Constitutional:      Appearance: Normal appearance. She is normal weight.  Skin:    General: Skin is warm.     Capillary Refill: Capillary refill takes less than 2 seconds.  Neurological:     General: No focal deficit present.     Mental Status: She is alert and oriented to person, place, and time. Mental status is at baseline.  Psychiatric:        Attention and Perception: Attention and perception normal.        Mood and Affect: Mood is anxious and depressed.        Speech: Speech normal.        Behavior: Behavior normal.        Thought Content: Thought content normal.        Cognition and Memory: Cognition and memory normal.        Judgment: Judgment normal.    Review of Systems  Psychiatric/Behavioral:  Positive for depression and suicidal ideas.    Blood pressure 115/69, pulse 99, temperature 98.1 F (36.7 C), temperature source Oral, resp. rate 18, height '5\' 6"'$  (1.676 m), weight 65.4 kg, SpO2 95 %. Body mass index is 23.27 kg/m.  Treatment Plan Summary: 56 y/o female who was admitted to [facility] for treatment of Anxiety and depression. She was transferred to the ED from behavioral health hospital due to hypotension and tachycardia. However, patient reports ongoing anxiety, depression, passive suicidal thoughts. As a result, she will benefit from  admission to [facility] after she is medically stabilized.  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  -Continue Lexapro, Trazodone, Buspar as currently prescribed for anxiety/depression. Will dc Wellbutrin at this time, increase in neuropsychiatric symptoms since initiation. May beenfit from atypical antipsychotic for augmentation of depression symptoms and delusions associated with underlying illness.  -Recommend neurology follow up, for MRI in 2019 with Periventricular white matter changes bilaterally are mildly advanced for age.Consider obtaining MRI or neuro consult during medical admission.  -Social worker to facilitate patient's inpatient psychiatric hospitalization, she is medically stable at this time.   Disposition: Recommend psychiatric Inpatient admission when medically cleared. Supportive therapy provided about ongoing stressors. Psychiatric consult service will continue to follow this patient  Suella Broad, FNP 04/24/2022 5:39 PM

## 2022-04-25 DIAGNOSIS — F339 Major depressive disorder, recurrent, unspecified: Secondary | ICD-10-CM | POA: Diagnosis not present

## 2022-04-25 DIAGNOSIS — Z91411 Personal history of adult psychological abuse: Secondary | ICD-10-CM | POA: Diagnosis not present

## 2022-04-25 DIAGNOSIS — R45851 Suicidal ideations: Secondary | ICD-10-CM | POA: Diagnosis not present

## 2022-04-25 DIAGNOSIS — H43812 Vitreous degeneration, left eye: Secondary | ICD-10-CM | POA: Diagnosis not present

## 2022-04-25 DIAGNOSIS — Z811 Family history of alcohol abuse and dependence: Secondary | ICD-10-CM | POA: Diagnosis not present

## 2022-04-25 DIAGNOSIS — F333 Major depressive disorder, recurrent, severe with psychotic symptoms: Secondary | ICD-10-CM | POA: Diagnosis not present

## 2022-04-25 DIAGNOSIS — Z9151 Personal history of suicidal behavior: Secondary | ICD-10-CM | POA: Diagnosis not present

## 2022-04-25 DIAGNOSIS — F411 Generalized anxiety disorder: Secondary | ICD-10-CM | POA: Diagnosis not present

## 2022-04-25 NOTE — ED Provider Notes (Signed)
[facility] Provider Note   CSN: [id number] Arrival date & time: 04/21/22  1136     History  Chief Complaint  Patient presents with   Hypotension    Christine Bishop is a 56 y.o. female who is a discharge readmit. She was discharged from [facility] to be readmitted to [facility]. She has already been accepted by Dr.Ortiz for admission for hypotension/tachycardia.   HPI     Home Medications Prior to Admission medications   Medication Sig Start Date End Date Taking? Authorizing Provider  AZO URINARY PAIN RELIEF 95 MG tablet Take 95 mg by mouth 3 (three) times daily as needed for pain.   Yes [provider]  buPROPion (WELLBUTRIN XL) 300 MG 24 hr tablet Take 1 tablet (300 mg total) by mouth every morning. Start on 04/07/22 04/02/22 04/02/23 Yes Vista Mink, MD  busPIRone (BUSPAR) 5 MG tablet Take 5 mg by mouth 2 (two) times daily as needed (for anxiety). 09/19/21  Yes [provider]  COMBIPATCH 0.05-0.14 MG/DAY Place 1 patch onto the skin See admin instructions. Apply a new patch topically on Tuesdays and Saturdays (after removal of the old one) 04/05/22  Yes [provider]  Diclofenac Potassium,Migraine, (CAMBIA) 50 MG PACK Take 50 mg by mouth See admin instructions. Administer one packet (50 mg) of CAMBIAT  for the acute treatment of migraine. Empty the contents of one packet into a cup containing 1 to 2 ounces (30 to 60 mL) of water, mix well and drink immediately. Do not use liquids other than water.   Yes [provider]  escitalopram (LEXAPRO) 20 MG tablet Take 1 tablet (20 mg total) by mouth daily. 04/02/22  Yes Vista Mink, MD  ibuprofen (ADVIL) 200 MG tablet Take 600 mg by mouth every 6 (six) hours as needed for headache or mild pain (or migraines).   Yes [provider]  ketorolac (ACULAR) 0.5 % ophthalmic solution INSTILL 1 DROP INTO THE RIGHT EYE 4 TIMES A DAY AS DIRECTED Patient taking differently: Place 2 drops into  the right eye in the morning and at bedtime. 06/26/21  Yes Rankin, Clent Demark, MD  Multiple Vitamins-Minerals (PRESERVISION AREDS PO) Take 1 tablet by mouth in the morning and at bedtime.   Yes [provider]  rosuvastatin (CRESTOR) 5 MG tablet Take 5 mg by mouth daily.   Yes [provider]  SYSTANE COMPLETE PF 0.6 % SOLN Place 1 drop into both eyes 4 (four) times daily.   Yes [provider]  traZODone (DESYREL) 50 MG tablet Take 50 mg by mouth at bedtime as needed for sleep.   Yes [provider]  buPROPion (WELLBUTRIN XL) 150 MG 24 hr tablet Take 1 tablet (150 mg total) by mouth every morning. Take for 4 days before increasing to 300 mg daily Patient not taking: Reported on 04/21/2022 04/02/22 04/02/23  Vista Mink, MD  nitrofurantoin, macrocrystal-monohydrate, (MACROBID) 100 MG capsule Take 1 capsule (100 mg total) by mouth at bedtime. Patient not taking: Reported on 04/21/2022 03/04/22   Corky Sox, MD      Allergies    Inderal [propranolol], Septra [sulfamethoxazole-trimethoprim], Zonegran [zonisamide], and Topamax [topiramate]    Review of Systems   Review of Systems Review of systems Negative for f/c.  Physical Exam Updated Vital Signs BP 111/73 (BP Location: Right Arm)   Pulse (!) 101   Temp 98.3 F (36.8 C) (Oral)   Resp 14   Ht '5\' 6"'$  (1.676  m)   Wt 65.4 kg   SpO2 95%   BMI 23.27 kg/m  Physical Exam General: Normal appearing female, lying in bed.  HEENT: PERRLA, Sclera anicteric, MMM, trachea midline.  Cardiology: RRR, no murmurs/rubs/gallops. BL radial and DP pulses equal bilaterally.  Resp: Normal respiratory rate and effort. CTAB, no wheezes, rhonchi, crackles.  Abd: Soft, non-tender, non-distended. No rebound tenderness or guarding.  GU: Deferred. MSK: No peripheral edema or signs of trauma. Extremities without deformity or TTP. No cyanosis or clubbing. Skin: warm, dry. No rashes or lesions. Back: No CVA tenderness Neuro:  A&Ox4, CNs II-XII grossly intact. MAEs. Sensation grossly intact.  Psych: Normal mood and affect.   ED Results / Procedures / Treatments   Labs (all labs ordered are listed, but only abnormal results are displayed) Labs Reviewed  CBC WITH DIFFERENTIAL/PLATELET - Abnormal; Notable for the following components:      Result Value   RBC 3.42 (*)    Hemoglobin 10.8 (*)    HCT 34.0 (*)    RDW 15.6 (*)    Platelets 113 (*)    All other components within normal limits  COMPREHENSIVE METABOLIC PANEL - Abnormal; Notable for the following components:   Potassium 3.4 (*)    Chloride 112 (*)    Calcium 7.8 (*)    Total Protein 5.1 (*)    Albumin 2.9 (*)    All other components within normal limits  BASIC METABOLIC PANEL - Abnormal; Notable for the following components:   BUN <5 (*)    Calcium 8.2 (*)    All other components within normal limits  HIV ANTIBODY (ROUTINE TESTING W REFLEX)  MAGNESIUM    EKG None  Radiology No results found.  Procedures Procedures    Medications Ordered in ED Medications  busPIRone (BUSPAR) tablet 5 mg (5 mg Oral Given 04/25/22 1030)  escitalopram (LEXAPRO) tablet 20 mg (20 mg Oral Given 04/25/22 1030)  rosuvastatin (CRESTOR) tablet 5 mg (5 mg Oral Given 04/25/22 1030)  enoxaparin (LOVENOX) injection 40 mg (40 mg Subcutaneous Given 04/24/22 2119)  acetaminophen (TYLENOL) tablet 650 mg (has no administration in time range)    Or  acetaminophen (TYLENOL) suppository 650 mg (has no administration in time range)  ondansetron (ZOFRAN) tablet 4 mg ( Oral See Alternative 04/25/22 0838)    Or  ondansetron (ZOFRAN) injection 4 mg (4 mg Intravenous Given 04/25/22 0838)  0.9 %  sodium chloride infusion (0 mLs Intravenous Stopped 04/22/22 1846)  traZODone (DESYREL) tablet 50 mg (has no administration in time range)  fosfomycin (MONUROL) packet 3 g (3 g Oral Not Given 04/23/22 1122)  potassium chloride SA (KLOR-CON M) CR tablet 40 mEq (40 mEq Oral Given 04/21/22 1312)   potassium chloride SA (KLOR-CON M) CR tablet 40 mEq (40 mEq Oral Given 04/22/22 0901)    ED Course/ Medical Decision Making/ A&P                          Medical Decision Making Risk Decision regarding hospitalization.    This patient presents to the ED for concern of hypotension/tachycardia, this involves an extensive number of treatment options, and is a complaint that carries with it a high risk of complications and morbidity.   MDM:    Patient is a discharge-readmit from [facility] and will be admitted to medicine for further w/u and management. She is being treated with fluids, ativan, and ceftriaxone.   Cardiac Monitoring: The patient was maintained on a  cardiac monitor.  I personally viewed and interpreted the cardiac monitored which showed an underlying rhythm of: NSR  Disposition:  Admit to hospitalist  Co morbidities that complicate the patient evaluation  Past Medical History:  Diagnosis Date   Anxiety    Dysplastic nevus 12/24/2019   R upper back paraspinal - moderate   Dysplastic nevus 12/24/2019   R to mid upper back 3.0 cm lat to spine above braline - mild   History of migraine headaches    Posterior vitreous detachment of left eye 12/14/2019   Vitreous membranes and strands, left 04/20/2020   Vitrectomy left eye 04-27-20     Medicines Meds ordered this encounter  Medications   DISCONTD: cefTRIAXone (ROCEPHIN) 1 g in sodium chloride 0.9 % 100 mL IVPB    Order Specific Question:   Antibiotic Indication:    Answer:   UTI   DISCONTD: buPROPion (WELLBUTRIN XL) 24 hr tablet 300 mg    Start on 04/07/22     busPIRone (BUSPAR) tablet 5 mg   escitalopram (LEXAPRO) tablet 20 mg   rosuvastatin (CRESTOR) tablet 5 mg   enoxaparin (LOVENOX) injection 40 mg   OR Linked Order Group    acetaminophen (TYLENOL) tablet 650 mg    acetaminophen (TYLENOL) suppository 650 mg   OR Linked Order Group    ondansetron (ZOFRAN) tablet 4 mg    ondansetron (ZOFRAN) injection 4 mg   0.9  %  sodium chloride infusion   potassium chloride SA (KLOR-CON M) CR tablet 40 mEq   traZODone (DESYREL) tablet 50 mg   potassium chloride SA (KLOR-CON M) CR tablet 40 mEq   fosfomycin (MONUROL) packet 3 g    I have reviewed the patients home medicines and have made adjustments as needed  Problem List / ED Course: Problem List Items Addressed This Visit   None Visit Diagnoses     Hypotension, unspecified hypotension type    -  Primary   Relevant Medications   rosuvastatin (CRESTOR) tablet 5 mg   enoxaparin (LOVENOX) injection 40 mg                      This note was created using dictation software, which may contain spelling or grammatical errors.    Audley Hose, MD 04/25/22 1145

## 2022-04-25 NOTE — Progress Notes (Signed)
CSW sent referral to [facility] to be reviewed on 04/24/22. CSW will inquire with CM if pt will need faxed out to other facilities.   Benjaman Kindler, MSW, LCSWA 04/25/2022 9:08 AM

## 2022-04-25 NOTE — TOC Progression Note (Addendum)
Transition of Care [facility]) - Progression Note    Patient Details  Name: Christine Bishop MRN: [id number] Date of Birth: [birth year]  [facility]) CM/SW Contact  Henrietta Dine, RN Phone Number: 04/25/2022, 11:15 AM  Clinical Narrative:    Awaiting response from [facility]; also faxed out bed requests to [place], [place], [place], [facility], [place], [place], [place], [place], [place], [place], [place], [place], [place], [place], [place], [place], [place]; electronic confirmation received; awaiting bed offers.  -[phone]- notified by Warden Fillers at [facility] that pt has a bed offer and can come today; she says the facility will need a d/c summary , COVID test and copy of MAR; she also says the pt will be coming to the main campus and Dr Jonelle Sports is the accepting MD; the pager # for RN receiving report is [phone] and the nurse will call back for report; pt notified and agrees to admission to [facility]; the pt will be transported by [facility]; pt signed Buyer, retail and Release of Liability   -[phone]-spoke w/ [place] and verified pt transport address; [place], [place], [place] [address]; pt's husband Barbaraann Rondo notified; pt will be transported by [facility] at 1255, w/ 1330 pick up at ED entrance, spoke w/ Almyra Free; no TOC needs.        Expected Discharge Plan and Services           Expected Discharge Date: 04/23/22                                     Social Determinants of Health (SDOH) Interventions    Readmission Risk Interventions     No data to display

## 2022-04-25 NOTE — Discharge Summary (Signed)
Discharge summary done and completed by Dr. Maylene Roes on 12/4.  Today patient remains medically stable for inpatient psychiatry.  There was concerns that patient's weakness could have been secondary to any autoimmune disease such as MS.  MRI back in 2019 showed some concerns for demyelinating disease.  I had extensive discussion with the patient and went over her records in care everywhere.  Also went over neurology records and patient has undergone extensive workup including lumbar puncture and autoimmune studies which has ruled out any multiple sclerosis.  At this time her vital signs remained stable and should be transferred to inpatient psychiatry.  Patient is voluntarily agreeing to this.  According to staff no need for IVC.   Please see attached discharge summary by Dr. Maylene Roes from 12/4 for further details   Discussed care with in-house staff including RN, [facility] and psychiatry team.  Please reach out for any further questions as necessary.  Christine Ren MD [facility]

## 2022-04-26 LAB — CULTURE, BLOOD (ROUTINE X 2)
Culture: NO GROWTH
Culture: NO GROWTH
Special Requests: ADEQUATE
Special Requests: ADEQUATE

## 2022-04-30 ENCOUNTER — Ambulatory Visit (HOSPITAL_COMMUNITY): Payer: Federal, State, Local not specified - PPO | Admitting: Psychiatry

## 2022-05-04 DIAGNOSIS — F339 Major depressive disorder, recurrent, unspecified: Secondary | ICD-10-CM | POA: Diagnosis not present

## 2022-05-24 DIAGNOSIS — N951 Menopausal and female climacteric states: Secondary | ICD-10-CM | POA: Diagnosis not present

## 2022-05-26 ENCOUNTER — Other Ambulatory Visit (HOSPITAL_COMMUNITY): Payer: Self-pay | Admitting: Psychiatry

## 2022-05-26 DIAGNOSIS — F321 Major depressive disorder, single episode, moderate: Secondary | ICD-10-CM

## 2022-05-29 DIAGNOSIS — F4323 Adjustment disorder with mixed anxiety and depressed mood: Secondary | ICD-10-CM | POA: Diagnosis not present

## 2022-05-30 ENCOUNTER — Encounter (HOSPITAL_COMMUNITY): Payer: Self-pay | Admitting: Psychiatry

## 2022-05-30 ENCOUNTER — Ambulatory Visit (HOSPITAL_COMMUNITY): Payer: Federal, State, Local not specified - PPO | Admitting: Psychiatry

## 2022-05-30 DIAGNOSIS — F321 Major depressive disorder, single episode, moderate: Secondary | ICD-10-CM | POA: Diagnosis not present

## 2022-05-30 DIAGNOSIS — F411 Generalized anxiety disorder: Secondary | ICD-10-CM

## 2022-05-30 MED ORDER — LITHIUM CARBONATE 300 MG PO TABS: 300.0000 mg | ORAL_TABLET | Freq: Two times a day (BID) | ORAL | 1 refills | Status: DC

## 2022-05-30 MED ORDER — SERTRALINE HCL 25 MG PO TABS: 25.0000 mg | ORAL_TABLET | Freq: Every day | ORAL | 2 refills | Status: DC
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
## 2022-06-01 ENCOUNTER — Encounter: Payer: Self-pay | Admitting: Nurse Practitioner

## 2022-06-12 DIAGNOSIS — F4323 Adjustment disorder with mixed anxiety and depressed mood: Secondary | ICD-10-CM | POA: Diagnosis not present

## 2022-06-19 DIAGNOSIS — F4323 Adjustment disorder with mixed anxiety and depressed mood: Secondary | ICD-10-CM | POA: Diagnosis not present

## 2022-06-20 ENCOUNTER — Encounter: Payer: Federal, State, Local not specified - PPO | Admitting: Nurse Practitioner

## 2022-06-22 ENCOUNTER — Other Ambulatory Visit (HOSPITAL_COMMUNITY): Payer: Self-pay | Admitting: Psychiatry

## 2022-06-22 DIAGNOSIS — F411 Generalized anxiety disorder: Secondary | ICD-10-CM

## 2022-06-22 DIAGNOSIS — F321 Major depressive disorder, single episode, moderate: Secondary | ICD-10-CM

## 2022-06-25 ENCOUNTER — Ambulatory Visit (HOSPITAL_BASED_OUTPATIENT_CLINIC_OR_DEPARTMENT_OTHER): Payer: Federal, State, Local not specified - PPO | Admitting: Psychiatry

## 2022-06-25 ENCOUNTER — Encounter (HOSPITAL_COMMUNITY): Payer: Self-pay | Admitting: Psychiatry

## 2022-06-25 DIAGNOSIS — F321 Major depressive disorder, single episode, moderate: Secondary | ICD-10-CM | POA: Diagnosis not present

## 2022-06-25 DIAGNOSIS — F411 Generalized anxiety disorder: Secondary | ICD-10-CM | POA: Diagnosis not present

## 2022-06-25 MED ORDER — SERTRALINE HCL 25 MG PO TABS: 25.0000 mg | ORAL_TABLET | Freq: Every day | ORAL | 0 refills | Status: DC

## 2022-06-25 MED ORDER — LITHIUM CARBONATE 300 MG PO TABS: 300.0000 mg | ORAL_TABLET | Freq: Two times a day (BID) | ORAL | 0 refills | Status: DC
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
## 2022-07-16 DIAGNOSIS — F4323 Adjustment disorder with mixed anxiety and depressed mood: Secondary | ICD-10-CM | POA: Diagnosis not present

## 2022-07-23 NOTE — Telephone Encounter (Signed)
Pt is calling to check the status of the STD and asking for a call back for update.

## 2022-07-23 NOTE — Telephone Encounter (Signed)
PCP is aware of pt requesting PW

## 2022-08-01 DIAGNOSIS — F4323 Adjustment disorder with mixed anxiety and depressed mood: Secondary | ICD-10-CM | POA: Diagnosis not present

## 2022-08-06 ENCOUNTER — Encounter (HOSPITAL_COMMUNITY): Payer: Self-pay | Admitting: Psychiatry

## 2022-08-06 ENCOUNTER — Ambulatory Visit (HOSPITAL_COMMUNITY): Payer: Federal, State, Local not specified - PPO | Admitting: Psychiatry

## 2022-08-06 VITALS — BP 100/67 | HR 98 | Ht 66.0 in | Wt 138.0 lb

## 2022-08-06 DIAGNOSIS — F321 Major depressive disorder, single episode, moderate: Secondary | ICD-10-CM

## 2022-08-06 DIAGNOSIS — F411 Generalized anxiety disorder: Secondary | ICD-10-CM | POA: Diagnosis not present

## 2022-08-06 MED ORDER — LAMOTRIGINE 25 MG PO TABS: ORAL_TABLET | ORAL | 0 refills | Status: DC

## 2022-08-06 MED ORDER — TRAZODONE HCL 50 MG PO TABS: 50.0000 mg | ORAL_TABLET | Freq: Every evening | ORAL | 1 refills | Status: DC | PRN
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
## 2022-08-09 DIAGNOSIS — N951 Menopausal and female climacteric states: Secondary | ICD-10-CM | POA: Diagnosis not present

## 2022-08-09 DIAGNOSIS — L089 Local infection of the skin and subcutaneous tissue, unspecified: Secondary | ICD-10-CM | POA: Diagnosis not present

## 2022-08-09 DIAGNOSIS — Z1231 Encounter for screening mammogram for malignant neoplasm of breast: Secondary | ICD-10-CM | POA: Diagnosis not present

## 2022-08-09 DIAGNOSIS — Z1389 Encounter for screening for other disorder: Secondary | ICD-10-CM | POA: Diagnosis not present

## 2022-08-09 DIAGNOSIS — Z01419 Encounter for gynecological examination (general) (routine) without abnormal findings: Secondary | ICD-10-CM | POA: Diagnosis not present

## 2022-08-20 ENCOUNTER — Encounter: Payer: Self-pay | Admitting: Nurse Practitioner

## 2022-08-20 ENCOUNTER — Ambulatory Visit: Payer: Federal, State, Local not specified - PPO | Admitting: Nurse Practitioner

## 2022-08-20 VITALS — BP 100/65 | HR 70 | Ht 66.0 in | Wt 140.3 lb

## 2022-08-20 DIAGNOSIS — G629 Polyneuropathy, unspecified: Secondary | ICD-10-CM | POA: Diagnosis not present

## 2022-08-20 DIAGNOSIS — F331 Major depressive disorder, recurrent, moderate: Secondary | ICD-10-CM

## 2022-08-20 DIAGNOSIS — B370 Candidal stomatitis: Secondary | ICD-10-CM | POA: Diagnosis not present

## 2022-08-20 MED ORDER — FLUCONAZOLE 150 MG PO TABS: ORAL_TABLET | ORAL | 0 refills | Status: DC

## 2022-08-20 MED ORDER — NYSTATIN 100000 UNIT/ML MT SUSP: 5.0000 mL | Freq: Four times a day (QID) | OROMUCOSAL | 1 refills | Status: DC

## 2022-08-20 NOTE — Progress Notes (Signed)
Established patient visit   Patient: Christine Bishop   DOB: 07-27-1965   57 y.o. Female  MRN: [id number] Visit Date: 08/20/2022  Chief Complaint  Patient presents with   Thrush   feet   Subjective    HPI  Christine Bishop  -was on antibiotics for infected tick bite.  -developed thrush.  -feels like she has cotton in her mouth  -food tastes nasty.  -saw the dentist the other day. Was told she had thrush.   Pain in both feet  -burning and stinging.  -at night, when she lays down, feet go numb.  -states that she has had this in the past.  -did take lyrical in the past and this helped. She states that she is no longer taking this.  -she states that this time, pain is not so severe   The patient is getting ready to go back to work on Friday after being out for extended period of time due to depression. States that she needs a note from me stating that it is ok for her to return to work.   Medications: Outpatient Medications Prior to Visit  Medication Sig   Diclofenac Potassium,Migraine, (CAMBIA) 50 MG PACK Take 50 mg by mouth See admin instructions. Administer one packet (50 mg) of CAMBIAT  for the acute treatment of migraine. Empty the contents of one packet into a cup containing 1 to 2 ounces (30 to 60 mL) of water, mix well and drink immediately. Do not use liquids other than water.   ketorolac (ACULAR) 0.5 % ophthalmic solution INSTILL 1 DROP INTO THE RIGHT EYE 4 TIMES A DAY AS DIRECTED (Patient taking differently: Place 2 drops into the right eye in the morning and at bedtime.)   Multiple Vitamins-Minerals (PRESERVISION AREDS PO) Take 1 tablet by mouth in the morning and at bedtime.   rosuvastatin (CRESTOR) 5 MG tablet Take 5 mg by mouth daily.   SYSTANE COMPLETE PF 0.6 % SOLN Place 1 drop into both eyes 4 (four) times daily.   traZODone (DESYREL) 50 MG tablet Take 1 tablet (50 mg total) by mouth at bedtime as needed for sleep.   [DISCONTINUED] lamoTRIgine (LAMICTAL) 25 MG tablet Take 1  tablet (25 mg total) by mouth daily for 14 days, THEN 2 tablets (50 mg total) daily for 20 days. When you start the Lamictal 25 mg decrease Lithium to 150 mg in the evening. When you increase Lamictal to 50 mg decrease the Lithium to 150 mg twice a day.   [DISCONTINUED] lithium 300 MG tablet Take 1 tablet (300 mg total) by mouth 2 (two) times daily.   [DISCONTINUED] sertraline (ZOLOFT) 25 MG tablet Take 1 tablet (25 mg total) by mouth daily.   [DISCONTINUED] COMBIPATCH 0.05-0.14 MG/DAY Place 1 patch onto the skin See admin instructions. Apply a new patch topically on Tuesdays and Saturdays (after removal of the old one)   No facility-administered medications prior to visit.    Review of Systems See HPI     Objective     Today's Vitals   08/20/22 1500 08/20/22 1539  BP: (Abnormal) 89/60 100/65  Pulse: 70   SpO2: 99%   Weight: 140 lb 4.8 oz (63.6 kg)   Height: 5\' 6"  (1.676 m)    Body mass index is 22.65 kg/m.   Physical Exam Vitals and nursing note reviewed.  Constitutional:      Appearance: Normal appearance. She is well-developed.  HENT:     Head: Normocephalic and atraumatic.  Nose: Nose normal.     Mouth/Throat:     Mouth: Mucous membranes are moist.     Pharynx: Oropharynx is clear.     Comments: Plaques of thrush on the tongue and inner mucosa of both cheeks.  Eyes:     Extraocular Movements: Extraocular movements intact.     Conjunctiva/sclera: Conjunctivae normal.     Pupils: Pupils are equal, round, and reactive to light.  Neck:     Vascular: No carotid bruit.  Cardiovascular:     Rate and Rhythm: Normal rate and regular rhythm.     Pulses: Normal pulses.     Heart sounds: Normal heart sounds.  Pulmonary:     Effort: Pulmonary effort is normal.     Breath sounds: Normal breath sounds.  Abdominal:     Palpations: Abdomen is soft.  Musculoskeletal:        General: Normal range of motion.     Cervical back: Normal range of motion and neck supple.   Lymphadenopathy:     Cervical: No cervical adenopathy.  Skin:    General: Skin is warm and dry.     Capillary Refill: Capillary refill takes less than 2 seconds.  Neurological:     General: No focal deficit present.     Mental Status: She is alert and oriented to person, place, and time.  Psychiatric:        Mood and Affect: Mood normal.        Behavior: Behavior normal.        Thought Content: Thought content normal.        Judgment: Judgment normal.      Assessment & Plan     Thrush Assessment & Plan: -start diflucan 150 mg once. May repeat dose in 3 days for persistent symptoms.  -use nystatin rinse. Swish and swallow 5 to 10 mls four times daily for the next 7 to 10 days   Orders: -     Fluconazole; Take 1 tablet po once. May repeat dose in 3 days as needed for persistent symptoms.  Dispense: 3 tablet; Refill: 0 -     Nystatin; Take 5 mLs (500,000 Units total) by mouth 4 (four) times daily.  Dispense: 200 mL; Refill: 1  Moderate episode of recurrent major depressive disorder (HCC) Assessment & Plan: Stable and improved. Patient getting ready to go back to work. She will continue to follow up with psychiatry.    Peripheral polyneuropathy Assessment & Plan: Starting to experience increased neuropathy. Will monitor. Consider restarting Lyrica if indicated.       Return for as scheduled.        Carlean Jews, NP  [facility] at [facility] [phone] (phone) [phone] (fax)  [facility]

## 2022-09-04 ENCOUNTER — Encounter: Payer: Self-pay | Admitting: Nurse Practitioner

## 2022-09-05 ENCOUNTER — Ambulatory Visit (HOSPITAL_COMMUNITY): Payer: Federal, State, Local not specified - PPO | Admitting: Psychiatry

## 2022-09-05 ENCOUNTER — Other Ambulatory Visit: Payer: Self-pay | Admitting: Nurse Practitioner

## 2022-09-05 ENCOUNTER — Encounter (HOSPITAL_COMMUNITY): Payer: Self-pay | Admitting: Psychiatry

## 2022-09-05 DIAGNOSIS — G6289 Other specified polyneuropathies: Secondary | ICD-10-CM

## 2022-09-05 DIAGNOSIS — F321 Major depressive disorder, single episode, moderate: Secondary | ICD-10-CM | POA: Diagnosis not present

## 2022-09-05 DIAGNOSIS — F411 Generalized anxiety disorder: Secondary | ICD-10-CM | POA: Diagnosis not present

## 2022-09-05 MED ORDER — PREGABALIN 50 MG PO CAPS: 50.0000 mg | ORAL_CAPSULE | Freq: Two times a day (BID) | ORAL | 2 refills | Status: DC | PRN

## 2022-09-05 MED ORDER — SERTRALINE HCL 25 MG PO TABS: 25.0000 mg | ORAL_TABLET | Freq: Every day | ORAL | 0 refills | Status: DC

## 2022-09-05 MED ORDER — LAMOTRIGINE 100 MG PO TABS: 100.0000 mg | ORAL_TABLET | Freq: Every day | ORAL | 1 refills | Status: DC
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
## 2022-09-10 ENCOUNTER — Encounter (INDEPENDENT_AMBULATORY_CARE_PROVIDER_SITE_OTHER): Payer: Federal, State, Local not specified - PPO | Admitting: Ophthalmology

## 2022-09-19 ENCOUNTER — Ambulatory Visit (HOSPITAL_BASED_OUTPATIENT_CLINIC_OR_DEPARTMENT_OTHER): Payer: Federal, State, Local not specified - PPO | Admitting: Psychiatry

## 2022-09-19 ENCOUNTER — Encounter (HOSPITAL_COMMUNITY): Payer: Self-pay | Admitting: Psychiatry

## 2022-09-19 VITALS — BP 125/84 | HR 78 | Ht 66.0 in | Wt 144.0 lb

## 2022-09-19 DIAGNOSIS — F411 Generalized anxiety disorder: Secondary | ICD-10-CM | POA: Diagnosis not present

## 2022-09-19 DIAGNOSIS — F310 Bipolar disorder, current episode hypomanic: Secondary | ICD-10-CM

## 2022-09-19 DIAGNOSIS — N951 Menopausal and female climacteric states: Secondary | ICD-10-CM | POA: Diagnosis not present

## 2022-09-19 MED ORDER — LITHIUM CARBONATE 300 MG PO TABS: 300.0000 mg | ORAL_TABLET | Freq: Two times a day (BID) | ORAL | 2 refills | Status: DC

## 2022-09-19 MED ORDER — LAMOTRIGINE 25 MG PO TABS: 50.0000 mg | ORAL_TABLET | Freq: Every day | ORAL | 0 refills | Status: DC
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
## 2022-09-20 DIAGNOSIS — F32A Depression, unspecified: Secondary | ICD-10-CM | POA: Diagnosis not present

## 2022-09-20 DIAGNOSIS — F4323 Adjustment disorder with mixed anxiety and depressed mood: Secondary | ICD-10-CM | POA: Diagnosis not present

## 2022-09-20 DIAGNOSIS — R634 Abnormal weight loss: Secondary | ICD-10-CM | POA: Diagnosis not present

## 2022-09-20 DIAGNOSIS — Z1211 Encounter for screening for malignant neoplasm of colon: Secondary | ICD-10-CM | POA: Diagnosis not present

## 2022-09-20 DIAGNOSIS — K5900 Constipation, unspecified: Secondary | ICD-10-CM | POA: Diagnosis not present

## 2022-09-20 DIAGNOSIS — B370 Candidal stomatitis: Secondary | ICD-10-CM | POA: Insufficient documentation

## 2022-09-20 DIAGNOSIS — G629 Polyneuropathy, unspecified: Secondary | ICD-10-CM | POA: Insufficient documentation

## 2022-09-20 NOTE — Assessment & Plan Note (Signed)
>>  ASSESSMENT AND PLAN FOR MDD (MAJOR DEPRESSIVE DISORDER) WRITTEN ON 09/20/2022  2:47 PM BY BOSCIA, HEATHER E, NP  Stable and improved. Patient getting ready to go back to work. She will continue to follow up with psychiatry.

## 2022-09-20 NOTE — Assessment & Plan Note (Signed)
Starting to experience increased neuropathy. Will monitor. Consider restarting Lyrica if indicated.

## 2022-09-20 NOTE — Assessment & Plan Note (Signed)
-  start diflucan 150 mg once. May repeat dose in 3 days for persistent symptoms.  -use nystatin rinse. Swish and swallow 5 to 10 mls four times daily for the next 7 to 10 days

## 2022-09-20 NOTE — Assessment & Plan Note (Signed)
Stable and improved. Patient getting ready to go back to work. She will continue to follow up with psychiatry.

## 2022-09-21 ENCOUNTER — Ambulatory Visit (HOSPITAL_COMMUNITY)
Admission: EM | Admit: 2022-09-21 | Discharge: 2022-09-22 | Disposition: A | Discharge status: Other Acute Inpatient Hospital | Payer: Federal, State, Local not specified - PPO | Attending: Psychiatry | Admitting: Psychiatry

## 2022-09-21 DIAGNOSIS — F3010 Manic episode without psychotic symptoms, unspecified: Secondary | ICD-10-CM | POA: Diagnosis not present

## 2022-09-21 DIAGNOSIS — F3110 Bipolar disorder, current episode manic without psychotic features, unspecified: Secondary | ICD-10-CM

## 2022-09-21 DIAGNOSIS — F29 Unspecified psychosis not due to a substance or known physiological condition: Secondary | ICD-10-CM | POA: Diagnosis not present

## 2022-09-21 DIAGNOSIS — F319 Bipolar disorder, unspecified: Secondary | ICD-10-CM | POA: Diagnosis not present

## 2022-09-21 DIAGNOSIS — R4182 Altered mental status, unspecified: Secondary | ICD-10-CM | POA: Diagnosis not present

## 2022-09-21 DIAGNOSIS — F411 Generalized anxiety disorder: Secondary | ICD-10-CM | POA: Insufficient documentation

## 2022-09-21 DIAGNOSIS — F3164 Bipolar disorder, current episode mixed, severe, with psychotic features: Secondary | ICD-10-CM

## 2022-09-21 LAB — LIPID PANEL
Cholesterol: 249 mg/dL — ABNORMAL HIGH (ref 0–200)
HDL: 52 mg/dL (ref >40)
LDL Cholesterol: 172 mg/dL — ABNORMAL HIGH (ref 0–99)
Total CHOL/HDL Ratio: 4.8 RATIO
Triglycerides: 124 mg/dL (ref <150)
VLDL: 25 mg/dL (ref 0–40)

## 2022-09-21 LAB — POCT URINE DRUG SCREEN - MANUAL ENTRY (I-SCREEN)
POC Amphetamine UR: NOT DETECTED
POC Buprenorphine (BUP): NOT DETECTED
POC Cocaine UR: NOT DETECTED
POC Marijuana UR: NOT DETECTED
POC Methadone UR: NOT DETECTED
POC Methamphetamine UR: NOT DETECTED
POC Morphine: NOT DETECTED
POC Oxazepam (BZO): NOT DETECTED
POC Oxycodone UR: NOT DETECTED
POC Secobarbital (BAR): NOT DETECTED

## 2022-09-21 LAB — COMPREHENSIVE METABOLIC PANEL
ALT: 12 U/L (ref 0–44)
AST: 20 U/L (ref 15–41)
Albumin: 4 g/dL (ref 3.5–5.0)
Alkaline Phosphatase: 66 U/L (ref 38–126)
Anion gap: 13 (ref 5–15)
BUN: 10 mg/dL (ref 6–20)
CO2: 22 mmol/L (ref 22–32)
Calcium: 9.5 mg/dL (ref 8.9–10.3)
Chloride: 107 mmol/L (ref 98–111)
Creatinine, Ser: 0.86 mg/dL (ref 0.44–1.00)
GFR, Estimated: >60 mL/min (ref >60)
Glucose, Bld: 90 mg/dL (ref 70–99)
Potassium: 3.6 mmol/L (ref 3.5–5.1)
Sodium: 142 mmol/L (ref 135–145)
Total Bilirubin: 0.7 mg/dL (ref 0.3–1.2)
Total Protein: 7 g/dL (ref 6.5–8.1)

## 2022-09-21 LAB — CBC WITH DIFFERENTIAL/PLATELET
Abs Immature Granulocytes: 0.04 10*3/uL (ref 0.00–0.07)
Basophils Absolute: 0 10*3/uL (ref 0.0–0.1)
Basophils Relative: 1 %
Eosinophils Absolute: 0.1 10*3/uL (ref 0.0–0.5)
Eosinophils Relative: 1 %
HCT: 38.7 % (ref 36.0–46.0)
Hemoglobin: 12.9 g/dL (ref 12.0–15.0)
Immature Granulocytes: 1 %
Lymphocytes Relative: 12 %
Lymphs Abs: 0.9 10*3/uL (ref 0.7–4.0)
MCH: 31.9 pg (ref 26.0–34.0)
MCHC: 33.3 g/dL (ref 30.0–36.0)
MCV: 95.6 fL (ref 80.0–100.0)
Monocytes Absolute: 0.8 10*3/uL (ref 0.1–1.0)
Monocytes Relative: 10 %
Neutro Abs: 5.8 10*3/uL (ref 1.7–7.7)
Neutrophils Relative %: 75 %
Platelets: 215 10*3/uL (ref 150–400)
RBC: 4.05 MIL/uL (ref 3.87–5.11)
RDW: 12.7 % (ref 11.5–15.5)
WBC: 7.5 10*3/uL (ref 4.0–10.5)
nRBC: 0 % (ref 0.0–0.2)

## 2022-09-21 LAB — ETHANOL: Alcohol, Ethyl (B): <10 mg/dL (ref <10)

## 2022-09-21 LAB — TSH: TSH: 2.128 u[IU]/mL (ref 0.350–4.500)

## 2022-09-21 LAB — POC URINE PREG, ED: Preg Test, Ur: NEGATIVE

## 2022-09-21 MED ORDER — MAGNESIUM HYDROXIDE 400 MG/5ML PO SUSP: 30.0000 mL | Freq: Every day | ORAL | Status: DC | PRN

## 2022-09-21 MED ORDER — ZIPRASIDONE MESYLATE 20 MG IM SOLR: 20.0000 mg | INTRAMUSCULAR | Status: DC | PRN

## 2022-09-21 MED ORDER — ACETAMINOPHEN 325 MG PO TABS: 650.0000 mg | ORAL_TABLET | Freq: Four times a day (QID) | ORAL | Status: DC | PRN

## 2022-09-21 MED ORDER — LORAZEPAM 2 MG/ML IJ SOLN
INTRAMUSCULAR | Status: AC
  Administered 2022-09-21: 2 mg via INTRAMUSCULAR
  Filled 2022-09-21: qty 1

## 2022-09-21 MED ORDER — LORAZEPAM 1 MG PO TABS
1.0000 mg | ORAL_TABLET | ORAL | Status: AC | PRN
  Administered 2022-09-21: 1 mg via ORAL
  Filled 2022-09-21: qty 1

## 2022-09-21 MED ORDER — ALUM & MAG HYDROXIDE-SIMETH 200-200-20 MG/5ML PO SUSP: 30.0000 mL | ORAL | Status: DC | PRN

## 2022-09-21 MED ORDER — OLANZAPINE 10 MG PO TBDP
10.0000 mg | ORAL_TABLET | Freq: Three times a day (TID) | ORAL | Status: DC | PRN
  Administered 2022-09-21 (×3): 10 mg via ORAL
  Filled 2022-09-21 (×3): qty 1

## 2022-09-21 MED ORDER — LORAZEPAM 2 MG/ML IJ SOLN: 2.0000 mg | Freq: Once | INTRAMUSCULAR | Status: AC

## 2022-09-21 NOTE — ED Provider Notes (Signed)
[facility] Continuous Assessment Admission H&P  Date: 09/21/22 Patient Name: Christine Bishop MRN: [id number] Chief Complaint: hyper manic  Diagnoses:  Final diagnoses:  Manic behavior (HCC)  Bipolar affective disorder, current episode manic, current episode severity unspecified [facility])    HPI: Christine Bishop 57 y/o female with a history of manic behavior, bipolar disorder, anxiety, depression and suicidal ideation.  Presented to [facility] via [facility].  Patient is very manic pressured speech, flight of ideas very hyper religious.  Patient very fixated on she needs to prefer the world because the world is about and," I think the end of the world is near, I want to pray like [name] who is my ancestors.  Patient is not a good historian and it is very difficult to gather valid information from the patient at this time.  Patient was given 2 mg of Ativan because of erratic behavior shouting and screaming.  Patient did allow the nurse to give Ativan without any hold.  Face-to-face observation of patient, patient is alert and oriented to person.  Very manic, pressured speech, word salad, very hyper religious constantly talking about the end of the world and she need to pray for everyone.  Unable to stay focused and unable to answer questions appropriately.  Patient will need to be reassessed after she calms down.  Unable to properly assess patient at this time  Review of records show that patient was seen in walk-in psychiatry on 09/19/22.  At that time patient presented with racing thoughts flight of ideas patient did appear very disorganized patient was started on lithium 300 mg twice a day and was supposed to be tapered off of Lamictal.  It is unsure if patient is compliant with regiment because patient is disorganized at this time and cannot answer questions appropriately.   Recommend inpatient observation.   Total Time spent with patient: 30 minutes  Musculoskeletal  Strength & Muscle Tone:  within normal limits Gait & Station: normal Patient leans: N/A  Psychiatric Specialty Exam  Presentation General Appearance:  Bizarre  Eye Contact: Fair  Speech: Pressured  Speech Volume: Increased  Handedness: Right   Mood and Affect  Mood: Anxious; Euthymic  Affect: Inappropriate   Thought Process  Thought Processes: Irrevelant  Descriptions of Associations:Loose  Orientation:Partial  Thought Content:Obsessions; Scattered   Duration of Psychotic Symptoms: Greater than six months  Hallucinations:Hallucinations: None  Ideas of Reference:Percusatory  Suicidal Thoughts:Suicidal Thoughts: No  Homicidal Thoughts:Homicidal Thoughts: No   Sensorium  Memory: Immediate Poor  Judgment: Impaired  Insight: Lacking   Executive Functions  Concentration: Poor  Attention Span: Poor  Recall: Poor  Fund of Knowledge: Fair  Language: Fair   Psychomotor Activity  Psychomotor Activity: Psychomotor Activity: Normal   Assets  Assets: Desire for Improvement   Sleep  Sleep: Sleep: Fair Number of Hours of Sleep: 6   Nutritional Assessment (For OBS and FBC admissions only) Has the patient had a weight loss or gain of 10 pounds or more in the last 3 months?: No Has the patient had a decrease in food intake/or appetite?: No Does the patient have dental problems?: No Does the patient have eating habits or behaviors that may be indicators of an eating disorder including binging or inducing vomiting?: No Has the patient recently lost weight without trying?: 0 Has the patient been eating poorly because of a decreased appetite?: 0 Malnutrition Screening Tool Score: 0    Physical Exam HENT:     Head: Normocephalic.     Nose:  Nose normal.  Pulmonary:     Effort: Pulmonary effort is normal.  Musculoskeletal:        General: Normal range of motion.     Cervical back: Normal range of motion.  Neurological:     General: No focal deficit  present.     Mental Status: She is alert.  Psychiatric:        Mood and Affect: Mood normal.        Behavior: Behavior normal.    Review of Systems  Constitutional: Negative.   HENT: Negative.    Eyes: Negative.   Respiratory: Negative.    Cardiovascular: Negative.   Gastrointestinal: Negative.   Genitourinary: Negative.   Musculoskeletal: Negative.   Skin: Negative.   Neurological: Negative.   Psychiatric/Behavioral:  The patient is nervous/anxious.     Blood pressure 108/60, pulse 90, temperature 98.4 F (36.9 C), temperature source Oral, resp. rate 16, SpO2 99 %. There is no height or weight on file to calculate BMI.  Past Psychiatric History: Bipolar disorder, general anxiety disorder, suicidal ideation,  Is the patient at risk to self? Yes  Has the patient been a risk to self in the past 6 months? Yes .    Has the patient been a risk to self within the distant past? Yes   Is the patient a risk to others? No   Has the patient been a risk to others in the past 6 months? No   Has the patient been a risk to others within the distant past? No   Past Medical History: See chart  Family History: Unknown  Social History: Unknown  Last Labs:  Admission on 09/21/2022  Component Date Value Ref Range Status   WBC 09/21/2022 7.5  4.0 - 10.5 K/uL Final   RBC 09/21/2022 4.05  3.87 - 5.11 MIL/uL Final   Hemoglobin 09/21/2022 12.9  12.0 - 15.0 g/dL Final   HCT 28/41/3244 38.7  36.0 - 46.0 % Final   MCV 09/21/2022 95.6  80.0 - 100.0 fL Final   MCH 09/21/2022 31.9  26.0 - 34.0 pg Final   MCHC 09/21/2022 33.3  30.0 - 36.0 g/dL Final   RDW 05/23/7251 12.7  11.5 - 15.5 % Final   Platelets 09/21/2022 215  150 - 400 K/uL Final   nRBC 09/21/2022 0.0  0.0 - 0.2 % Final   Neutrophils Relative % 09/21/2022 75  % Final   Neutro Abs 09/21/2022 5.8  1.7 - 7.7 K/uL Final   Lymphocytes Relative 09/21/2022 12  % Final   Lymphs Abs 09/21/2022 0.9  0.7 - 4.0 K/uL Final   Monocytes Relative  09/21/2022 10  % Final   Monocytes Absolute 09/21/2022 0.8  0.1 - 1.0 K/uL Final   Eosinophils Relative 09/21/2022 1  % Final   Eosinophils Absolute 09/21/2022 0.1  0.0 - 0.5 K/uL Final   Basophils Relative 09/21/2022 1  % Final   Basophils Absolute 09/21/2022 0.0  0.0 - 0.1 K/uL Final   Immature Granulocytes 09/21/2022 1  % Final   Abs Immature Granulocytes 09/21/2022 0.04  0.00 - 0.07 K/uL Final   Performed at [facility], [address] [place], [place] [address]   Sodium 09/21/2022 142  135 - 145 mmol/L Final   Potassium 09/21/2022 3.6  3.5 - 5.1 mmol/L Final   Chloride 09/21/2022 107  98 - 111 mmol/L Final   CO2 09/21/2022 22  22 - 32 mmol/L Final   Glucose, Bld 09/21/2022 90  70 -  99 mg/dL Final   Glucose reference range applies only to samples taken after fasting for at least 8 hours.   BUN 09/21/2022 10  6 - 20 mg/dL Final   Creatinine, Ser 09/21/2022 0.86  0.44 - 1.00 mg/dL Final   Calcium 40/98/1191 9.5  8.9 - 10.3 mg/dL Final   Total Protein 47/82/9562 7.0  6.5 - 8.1 g/dL Final   Albumin 13/12/6576 4.0  3.5 - 5.0 g/dL Final   AST 46/96/2952 20  15 - 41 U/L Final   ALT 09/21/2022 12  0 - 44 U/L Final   Alkaline Phosphatase 09/21/2022 66  38 - 126 U/L Final   Total Bilirubin 09/21/2022 0.7  0.3 - 1.2 mg/dL Final   GFR, Estimated 09/21/2022 >60  >60 mL/min Final   Comment: (NOTE) Calculated using the CKD-EPI Creatinine Equation (2021)    Anion gap 09/21/2022 13  5 - 15 Final   Performed at [facility], [address] [place], [place] [address]   Alcohol, Ethyl (B) 09/21/2022 <10  <10 mg/dL Final   Comment: (NOTE) Lowest detectable limit for serum alcohol is 10 mg/dL.  For medical purposes only. Performed at [facility], [address], [place], [place] [address]    Preg Test, Ur 09/21/2022 Negative  Negative Final   POC Amphetamine UR 09/21/2022 None Detected  NONE DETECTED (Cut Off Level 1000 ng/mL) Final   POC Secobarbital (BAR) 09/21/2022 None  Detected  NONE DETECTED (Cut Off Level 300 ng/mL) Final   POC Buprenorphine (BUP) 09/21/2022 None Detected  NONE DETECTED (Cut Off Level 10 ng/mL) Final   POC Oxazepam (BZO) 09/21/2022 None Detected  NONE DETECTED (Cut Off Level 300 ng/mL) Final   POC Cocaine UR 09/21/2022 None Detected  NONE DETECTED (Cut Off Level 300 ng/mL) Final   POC Methamphetamine UR 09/21/2022 None Detected  NONE DETECTED (Cut Off Level 1000 ng/mL) Final   POC Morphine 09/21/2022 None Detected  NONE DETECTED (Cut Off Level 300 ng/mL) Final   POC Methadone UR 09/21/2022 None Detected  NONE DETECTED (Cut Off Level 300 ng/mL) Final   POC Oxycodone UR 09/21/2022 None Detected  NONE DETECTED (Cut Off Level 100 ng/mL) Final   POC Marijuana UR 09/21/2022 None Detected  NONE DETECTED (Cut Off Level 50 ng/mL) Final  Admission on 04/21/2022, Discharged on 04/25/2022  Component Date Value Ref Range Status   WBC 04/22/2022 4.1  4.0 - 10.5 K/uL Final   RBC 04/22/2022 3.42 (L)  3.87 - 5.11 MIL/uL Final   Hemoglobin 04/22/2022 10.8 (L)  12.0 - 15.0 g/dL Final   HCT 27/25/3664 34.0 (L)  36.0 - 46.0 % Final   MCV 04/22/2022 99.4  80.0 - 100.0 fL Final   MCH 04/22/2022 31.6  26.0 - 34.0 pg Final   MCHC 04/22/2022 31.8  30.0 - 36.0 g/dL Final   RDW 40/34/7425 15.6 (H)  11.5 - 15.5 % Final   Platelets 04/22/2022 113 (L)  150 - 400 K/uL Final   nRBC 04/22/2022 0.0  0.0 - 0.2 % Final   Neutrophils Relative % 04/22/2022 53  % Final   Neutro Abs 04/22/2022 2.2  1.7 - 7.7 K/uL Final   Lymphocytes Relative 04/22/2022 30  % Final   Lymphs Abs 04/22/2022 1.2  0.7 - 4.0 K/uL Final   Monocytes Relative 04/22/2022 13  % Final   Monocytes Absolute 04/22/2022 0.6  0.1 - 1.0 K/uL Final   Eosinophils Relative 04/22/2022 2  % Final   Eosinophils Absolute  04/22/2022 0.1  0.0 - 0.5 K/uL Final   Basophils Relative 04/22/2022 1  % Final   Basophils Absolute 04/22/2022 0.0  0.0 - 0.1 K/uL Final   Immature Granulocytes 04/22/2022 1  % Final   Abs  Immature Granulocytes 04/22/2022 0.02  0.00 - 0.07 K/uL Final   Performed at [facility], [address] [place], [place] [address]   Sodium 04/22/2022 144  135 - 145 mmol/L Final   Potassium 04/22/2022 3.4 (L)  3.5 - 5.1 mmol/L Final   Chloride 04/22/2022 112 (H)  98 - 111 mmol/L Final   CO2 04/22/2022 25  22 - 32 mmol/L Final   Glucose, Bld 04/22/2022 83  70 - 99 mg/dL Final   Glucose reference range applies only to samples taken after fasting for at least 8 hours.   BUN 04/22/2022 8  6 - 20 mg/dL Final   Creatinine, Ser 04/22/2022 0.56  0.44 - 1.00 mg/dL Final   Calcium 60/45/4098 7.8 (L)  8.9 - 10.3 mg/dL Final   Total Protein 11/91/4782 5.1 (L)  6.5 - 8.1 g/dL Final   Albumin 95/62/1308 2.9 (L)  3.5 - 5.0 g/dL Final   AST 65/78/4696 22  15 - 41 U/L Final   ALT 04/22/2022 23  0 - 44 U/L Final   Alkaline Phosphatase 04/22/2022 44  38 - 126 U/L Final   Total Bilirubin 04/22/2022 0.5  0.3 - 1.2 mg/dL Final   GFR, Estimated 04/22/2022 >60  >60 mL/min Final   Comment: (NOTE) Calculated using the CKD-EPI Creatinine Equation (2021)    Anion gap 04/22/2022 7  5 - 15 Final   Performed at [facility], [address] [place], [place] [address]   HIV Screen 4th Generation wRfx 04/22/2022 Non Reactive  Non Reactive Final   Performed at [facility], [address], [place], [place] [address]   Sodium 04/23/2022 142  135 - 145 mmol/L Final   Potassium 04/23/2022 3.6  3.5 - 5.1 mmol/L Final   Chloride 04/23/2022 110  98 - 111 mmol/L Final   CO2 04/23/2022 25  22 - 32 mmol/L Final   Glucose, Bld 04/23/2022 88  70 - 99 mg/dL Final   Glucose reference range applies only to samples taken after fasting for at least 8 hours.   BUN 04/23/2022 <5 (L)  6 - 20 mg/dL Final   Creatinine, Ser 04/23/2022 0.59  0.44 - 1.00 mg/dL Final   Calcium 40/02/2724 8.2 (L)  8.9 - 10.3 mg/dL Final   GFR, Estimated 04/23/2022 >60  >60 mL/min Final   Comment:  (NOTE) Calculated using the CKD-EPI Creatinine Equation (2021)    Anion gap 04/23/2022 7  5 - 15 Final   Performed at [facility], [address] [place], [place] [address]   Magnesium 04/23/2022 1.8  1.7 - 2.4 mg/dL Final   Performed at [facility], [address], [place], [place] [address]  Admission on 04/19/2022, Discharged on 04/20/2022  Component Date Value Ref Range Status   Sodium 04/19/2022 139  135 - 145 mmol/L Final   Potassium 04/19/2022 2.6 (LL)  3.5 - 5.1 mmol/L Final   Comment: CRITICAL RESULT CALLED TO, READ BACK BY AND VERIFIED WITH RIMAFPO RN @ [id number] ON [id number] BY MAHMOUD,S    Chloride 04/19/2022 102  98 - 111 mmol/L Final   CO2 04/19/2022 27  22 - 32 mmol/L Final   Glucose, Bld 04/19/2022 82  70 - 99  mg/dL Final   Glucose reference range applies only to samples taken after fasting for at least 8 hours.   BUN 04/19/2022 21 (H)  6 - 20 mg/dL Final   Creatinine, Ser 04/19/2022 0.64  0.44 - 1.00 mg/dL Final   Calcium 16/02/9603 8.7 (L)  8.9 - 10.3 mg/dL Final   Total Protein 54/01/8118 6.3 (L)  6.5 - 8.1 g/dL Final   Albumin 14/78/2956 3.7  3.5 - 5.0 g/dL Final   AST 21/30/8657 27  15 - 41 U/L Final   ALT 04/19/2022 26  0 - 44 U/L Final   Alkaline Phosphatase 04/19/2022 47  38 - 126 U/L Final   Total Bilirubin 04/19/2022 1.1  0.3 - 1.2 mg/dL Final   GFR, Estimated 04/19/2022 >60  >60 mL/min Final   Comment: (NOTE) Calculated using the CKD-EPI Creatinine Equation (2021)    Anion gap 04/19/2022 10  5 - 15 Final   Performed at [facility], [address], [place], [place] [address]   Alcohol, Ethyl (B) 04/19/2022 <10  <10 mg/dL Final   Comment: (NOTE) Lowest detectable limit for serum alcohol is 10 mg/dL.  For medical purposes only. Performed at [facility], [address] [place], [place] [address]    WBC 04/19/2022 5.7  4.0 - 10.5 K/uL Final   RBC 04/19/2022 4.13  3.87 - 5.11  MIL/uL Final   Hemoglobin 04/19/2022 13.3  12.0 - 15.0 g/dL Final   HCT 41/32/4401 38.8  36.0 - 46.0 % Final   MCV 04/19/2022 93.9  80.0 - 100.0 fL Final   MCH 04/19/2022 32.2  26.0 - 34.0 pg Final   MCHC 04/19/2022 34.3  30.0 - 36.0 g/dL Final   RDW 02/72/5366 15.0  11.5 - 15.5 % Final   Platelets 04/19/2022 126 (L)  150 - 400 K/uL Final   nRBC 04/19/2022 0.0  0.0 - 0.2 % Final   Neutrophils Relative % 04/19/2022 55  % Final   Neutro Abs 04/19/2022 3.1  1.7 - 7.7 K/uL Final   Lymphocytes Relative 04/19/2022 27  % Final   Lymphs Abs 04/19/2022 1.6  0.7 - 4.0 K/uL Final   Monocytes Relative 04/19/2022 16  % Final   Monocytes Absolute 04/19/2022 0.9  0.1 - 1.0 K/uL Final   Eosinophils Relative 04/19/2022 1  % Final   Eosinophils Absolute 04/19/2022 0.1  0.0 - 0.5 K/uL Final   Basophils Relative 04/19/2022 1  % Final   Basophils Absolute 04/19/2022 0.0  0.0 - 0.1 K/uL Final   Immature Granulocytes 04/19/2022 0  % Final   Abs Immature Granulocytes 04/19/2022 0.02  0.00 - 0.07 K/uL Final   Performed at [facility], [address], [place], [place] [address]   I-stat hCG, quantitative 04/20/2022 13.1 (H)  <5 mIU/mL Final   Comment 3 04/20/2022          Final   Comment:   GEST. AGE      CONC.  (mIU/mL)   <=1 WEEK        5 - 50     2 WEEKS       50 - 500     3 WEEKS       100 - 10,000     4 WEEKS     1,000 - 30,000        FEMALE AND NON-PREGNANT FEMALE:     LESS THAN 5 mIU/mL    Acetaminophen (Tylenol), Serum 04/19/2022 <10 (L)  10 -  30 ug/mL Final   Comment: (NOTE) Therapeutic concentrations vary significantly. A range of 10-30 ug/mL  may be an effective concentration for many patients. However, some  are best treated at concentrations outside of this range. Acetaminophen concentrations >150 ug/mL at 4 hours after ingestion  and >50 ug/mL at 12 hours after ingestion are often associated with  toxic reactions.  Performed at [facility], [address] [place], [place] [address]    Salicylate Lvl 04/19/2022 <7.0 (L)  7.0 - 30.0 mg/dL Final   Performed at [facility], [address] [place], [place] [address]   Magnesium 04/19/2022 1.9  1.7 - 2.4 mg/dL Final   Performed at [facility], [address] [place], [place] [address]   Group A Strep by PCR 04/19/2022 NOT DETECTED  NOT DETECTED Final   Performed at [facility], [address], [place], [place] [address]   Sodium 04/20/2022 138  135 - 145 mmol/L Final   Potassium 04/20/2022 3.3 (L)  3.5 - 5.1 mmol/L Final   Chloride 04/20/2022 103  98 - 111 mmol/L Final   CO2 04/20/2022 27  22 - 32 mmol/L Final   Glucose, Bld 04/20/2022 83  70 - 99 mg/dL Final   Glucose reference range applies only to samples taken after fasting for at least 8 hours.   BUN 04/20/2022 17  6 - 20 mg/dL Final   Creatinine, Ser 04/20/2022 0.61  0.44 - 1.00 mg/dL Final   Calcium 29/56/2130 8.0 (L)  8.9 - 10.3 mg/dL Final   GFR, Estimated 04/20/2022 >60  >60 mL/min Final   Comment: (NOTE) Calculated using the CKD-EPI Creatinine Equation (2021)    Anion gap 04/20/2022 8  5 - 15 Final   Performed at [facility], [address] [place], [place] [address]   SARS Coronavirus 2 by RT PCR 04/20/2022 NEGATIVE  NEGATIVE Final   Comment: (NOTE) SARS-CoV-2 target nucleic acids are NOT DETECTED.  The SARS-CoV-2 RNA is generally detectable in upper and lower respiratory specimens during the acute phase of infection. The lowest concentration of SARS-CoV-2 viral copies this assay can detect is 250 copies / mL. A negative result does not preclude SARS-CoV-2 infection and should not be used as the sole basis for treatment or other patient management decisions.  A negative result may occur with improper specimen collection / handling, submission of specimen other than nasopharyngeal swab, presence of viral mutation(s) within the areas  targeted by this assay, and inadequate number of viral copies (<250 copies / mL). A negative result must be combined with clinical observations, patient history, and epidemiological information.  Fact Sheet for Patients:   RoadLapTop.co.za  Fact Sheet for Healthcare Providers: http://kim-miller.com/  This test is not yet approved or                           cleared by the [place] FDA and has been authorized for detection and/or diagnosis of SARS-CoV-2 by FDA under an Emergency Use Authorization (EUA).  This EUA will remain in effect (meaning this test can be used) for the duration of the COVID-19 declaration under Section 564(b)(1) of the Act, 21 U.S.C. section 360bbb-3(b)(1), unless the authorization is terminated or revoked sooner.  Performed at [facility], [address], [place], [place] [address]     Allergies: Inderal [propranolol], Septra [sulfamethoxazole-trimethoprim], Zonegran [zonisamide], and Topamax [topiramate]  Medications:  Facility Ordered  Medications  Medication   [COMPLETED] LORazepam (ATIVAN) injection 2 mg   acetaminophen (TYLENOL) tablet 650 mg   alum & mag hydroxide-simeth (MAALOX/MYLANTA) 200-200-20 MG/5ML suspension 30 mL   magnesium hydroxide (MILK OF MAGNESIA) suspension 30 mL   OLANZapine zydis (ZYPREXA) disintegrating tablet 10 mg   And   LORazepam (ATIVAN) tablet 1 mg   And   ziprasidone (GEODON) injection 20 mg   PTA Medications  Medication Sig   Multiple Vitamins-Minerals (PRESERVISION AREDS PO) Take 1 tablet by mouth in the morning and at bedtime.   ketorolac (ACULAR) 0.5 % ophthalmic solution INSTILL 1 DROP INTO THE RIGHT EYE 4 TIMES A DAY AS DIRECTED (Patient taking differently: Place 2 drops into the right eye in the morning and at bedtime.)   rosuvastatin (CRESTOR) 5 MG tablet Take 5 mg by mouth daily.   Diclofenac Potassium,Migraine, (CAMBIA) 50 MG PACK Take 50 mg  by mouth See admin instructions. Administer one packet (50 mg) of CAMBIAT  for the acute treatment of migraine. Empty the contents of one packet into a cup containing 1 to 2 ounces (30 to 60 mL) of water, mix well and drink immediately. Do not use liquids other than water.   SYSTANE COMPLETE PF 0.6 % SOLN Place 1 drop into both eyes 4 (four) times daily.   traZODone (DESYREL) 50 MG tablet Take 1 tablet (50 mg total) by mouth at bedtime as needed for sleep.   fluconazole (DIFLUCAN) 150 MG tablet Take 1 tablet po once. May repeat dose in 3 days as needed for persistent symptoms.   nystatin (MYCOSTATIN) 100000 UNIT/ML suspension Take 5 mLs (500,000 Units total) by mouth 4 (four) times daily.   sertraline (ZOLOFT) 25 MG tablet Take 1 tablet (25 mg total) by mouth daily.   pregabalin (LYRICA) 50 MG capsule Take 1 capsule (50 mg total) by mouth 2 (two) times daily as needed.   lithium 300 MG tablet Take 1 tablet (300 mg total) by mouth 2 (two) times daily.   lamoTRIgine (LAMICTAL) 25 MG tablet Take 2 tablets (50 mg total) by mouth daily. Taper Lamictal to 50 mg daily for 3 days before discontinuing the medication      Medical Decision Making  Inpatient observation  Meds ordered this encounter  Medications   DISCONTD: LORazepam (ATIVAN) 2 MG/ML injection    Moenga, Shadrack M: cabinet override   LORazepam (ATIVAN) injection 2 mg    Moenga, Shadrack M: cabinet override   acetaminophen (TYLENOL) tablet 650 mg   alum & mag hydroxide-simeth (MAALOX/MYLANTA) 200-200-20 MG/5ML suspension 30 mL   magnesium hydroxide (MILK OF MAGNESIA) suspension 30 mL   AND Linked Order Group    OLANZapine zydis (ZYPREXA) disintegrating tablet 10 mg    LORazepam (ATIVAN) tablet 1 mg    ziprasidone (GEODON) injection 20 mg    Lab Orders         CBC with Differential/Platelet         Comprehensive metabolic panel         Ethanol         Lipid panel         TSH         POC urine preg, ED         POCT Urine Drug  Screen - (I-Screen)        Recommendations  Based on my evaluation the patient appears to have an emergency medical condition for which I recommend the patient be transferred to the emergency department for further  evaluation.  Sindy Guadeloupe, NP 09/21/22  6:13 AM

## 2022-09-21 NOTE — ED Notes (Signed)
This pt was in bed 5 naked and said that God told her to be fruitful and multiply, she stated that God is talking to her and telling her what to do that's why she was in bed 5, she ask this writer could I hear him.

## 2022-09-21 NOTE — ED Notes (Signed)
Patient is a 57 y.o. who presents to [facility] voluntarily, accompanied by EMS due to bizarre behaviors and mental breakdown. Per EMS, pt husband called 911 tonight due to pt having bizarre behaviors, screaming, lashing out and crying. Pt screamed repeatedly " I want God to take me to heaven right now". Patient was screaming asking for prayers and talking about bible characters. Medications administered and was effective. Skin assessment completed. Patient resting quietly in bed with eyes closed, Respirations equal and unlabored, skin warm and dry, NAD. Routine safety checks conducted according to facility protocol. Will continue to monitor for safety.

## 2022-09-21 NOTE — ED Notes (Signed)
Pt in restroom pt was anxious medication was given(see Mar). Will continue to monitor for safety

## 2022-09-21 NOTE — ED Notes (Signed)
Pt  A&O x 4,  awake & restless at present. Calm & cooperative, no distress noted.  Monitoring for safety.

## 2022-09-21 NOTE — Progress Notes (Signed)
   09/21/22 0402  [facility] Triage Screening (Walk-ins at [facility] only)  How Did You Hear About [place]? Other (Comment)  What Is the Reason for Your Visit/Call Today? Pt presents to [facility] voluntarily, accompanied by EMS due to bizarre behaviors and mental breakdown. Per EMS, pt husband called 911 tonight due to pt having bizarre behaviors, screaming, lashing out and crying. Pt screamed repeatedly " I want God to take me to  heaven right now". Pt was unable to complete triage process due to current condition.  How Long Has This Been Causing You Problems?  Rich Reining)  Have You Recently Had Any Thoughts About Hurting Yourself?  Rich Reining)  Are You Planning to Commit Suicide/Harm Yourself At This time?  Rich Reining)  Have you Recently Had Thoughts About Hurting Someone Else?  Rich Reining)  Are You Planning To Harm Someone At This Time?  Rich Reining)  Are you currently experiencing any auditory, visual or other hallucinations?  (uta)  Have You Used Any Alcohol or Drugs in the Past 24 Hours?  (uta)  Do you have any current medical co-morbidities that require immediate attention?  Rich Reining)  Clinician description of patient physical appearance/behavior: excessive talking, not cooperative. Attempted to remove clothing, but was able to be redirected  What Do You Feel Would Help You the Most Today? Treatment for Depression or other mood problem  Determination of Need Emergent (2 hours)  Options For Referral Other: Comment;Outpatient Therapy;Medication Management;BH Urgent Care;Inpatient Hospitalization

## 2022-09-21 NOTE — Progress Notes (Signed)
Received Christine Bishop this AM asleep in her chair bed, she woke up wet with urine. Her bed linens were changed. She did not return to bed, but sat up writing by the telephone.  She remained cognitively disorganized with her thoughts. She endorsed feeling anxious depressed and hearing voices. She denied feeling suicidal at this time. She was given a cup of coffee per her request.

## 2022-09-21 NOTE — Progress Notes (Signed)
Patient evaluated by this provider. On evaluation, patient is noted to present with disorganized thoughts with delusional thought content and tangential speech. Patient is noted to be hyperreligious on exam. Patient states that she has not been feeling like herself since October or November of last year when she tried to kill herself. She states that she is not sure if it is [place] or God. She reports multiple inpatient psychiatric hospitalizations at [facility] and [facility]. Patient is recommended for inpatient psychiatric treatment. Patient will be placed under involuntary commitment at this time.   IVC: Respondent has a history of bipolar disorder, depression and several past suicide attempts. Respondent appears to be experiencing a manic episode. Respondent is hyperreligious, disorganized, and delusional. Respondent's husband states that she has been acting erratic, yelling, cursing, and lashing out. Due to respondent's current presentation she is a danger to self and others. Respondent is recommended for inpatient psychiatric treatment for mood stabilization.
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
## 2022-09-21 NOTE — Progress Notes (Signed)
Christine Bishop remained visible in the unit throughout the day, napping at intervals. She remains  cognitively disorganized, non violent and directable.

## 2022-09-21 NOTE — ED Notes (Deleted)
[facility]/OBS ASAP Discharge Summary  Date and Time: 09/21/2022 1:09 PM  Name: Christine Bishop  MRN:  [id number]   Discharge Diagnoses:  Final diagnoses:  Manic behavior (HCC)  Bipolar affective disorder, current episode manic, current episode severity unspecified (HCC)    Christine Bishop is a 57 y.o. female with a PMHx of MDD and GAD  who was admitted involuntarily as a direct admit brought in by EMS from the community [facility]) (admitted on 09/21/2022, total  LOS: 0 days ) for agitation and erratic behavior.  Subjective: Per HPI: "Christine Bishop 57 y/o female with a history of manic behavior, bipolar disorder, anxiety, depression and suicidal ideation.  Presented to [facility] via [facility].  Patient is very manic pressured speech, flight of ideas very hyper religious.  Patient very fixated on she needs to prefer the world because the world is about and," I think the end of the world is near, I want to pray like Christine Bishop who is my ancestors.  Patient is not a good historian and it is very difficult to gather valid information from the patient at this time.   Patient was given 2 mg of Ativan because of erratic behavior shouting and screaming.  Patient did allow the nurse to give Ativan without any hold.   Face-to-face observation of patient, patient is alert and oriented to person.  Very manic, pressured speech, word salad, very hyper religious constantly talking about the end of the world and she need to pray for everyone.  Unable to stay focused and unable to answer questions appropriately.  Patient will need to be reassessed after she calms down.  Unable to properly assess patient at this time   Review of records show that patient was seen in walk-in psychiatry on 09/19/22.  At that time patient presented with racing thoughts flight of ideas patient did appear very disorganized patient was started on lithium 300 mg twice a day and was supposed to be tapered off of Lamictal.  It  is unsure if patient is compliant with regiment because patient is disorganized at this time and cannot answer questions appropriately.     Recommend inpatient observation."  Patient's narrative on day of discharge: I saw the patient today in the presence of Dr. Lucianne Bishop and PA student Christine Bishop.  Patient stated she was brought here due to an overdose on trazodone, and that her husband called the ambulance. Patient kept speaking about religious themes consisting of God, Satan, and heaven. Patient also spoke about her house being haunted.   Patient stated she has not slept in 1 - 2 weeks. Says she is no longer feeling suicidal at this time, but did feel suicidal before she came in. She feels her memory has not been good. Patient says she lives with her husband and daughter.  She gave [place] permission to speak to both the husband and daughter.      Collateral: Spoke with patients husband. Husband stated patient began having symptoms of depression beginning last Fall. In December 2024, patient was started on lithium and she was doing well on it until she decided to self-discontinue due to tinnitus and hair loss.  Over the course of the month, patient reportedly began exhibiting bizzare behavior around the house prompting the family to see Dr. Mercy Riding who restarted her on lithium (09/19/2022) at the same time, patient also continued to take her hormone replacement therapy.  Last night, she became agitated, confused, screaming, and "talking out of her head."  This caused concern and prompted the family to call in an ambulance.  Husband believes patient's symptoms began when she started going through menopause (1 - 2 years ago) and when she was taking HRT. He says they have been married for 26 years and he is not aware of any mental health conditions in the past.  He states patient hasn't drank since she was a teenager. He confirms she does not use any recreational drugs. With regards to safety, he says his son  in law has gun, but does keep it secure in the house inside a safe.  Per husband: Family hx: father suicide attempt via, both parents drink Social: lives with husband, daughter  Stay Summary: Behavior during stay: bizarre. Not violent or disruptive Medications during stay: received olanzapine x2  Total Time spent with patient: 1.5 hours  Past Psychiatric and Medical History:  Past Medical History:  Diagnosis Date   Anxiety    Dysplastic nevus 12/24/2019   R upper back paraspinal - moderate   Dysplastic nevus 12/24/2019   R to mid upper back 3.0 cm lat to spine above braline - mild   History of migraine headaches    Posterior vitreous detachment of left eye 12/14/2019   Vitreous membranes and strands, left 04/20/2020   Vitrectomy left eye 04-27-20    Past Surgical History:  Procedure Laterality Date   CHOLECYSTECTOMY OPEN     LASIK     LEEP     Family History:  Family History  Problem Relation Age of Onset   Cancer Mother    Hypertension Father    Hyperlipidemia Father    Cancer Maternal Grandmother    Family Psychiatric History:  as above Social History:  Social History   Substance and Sexual Activity  Alcohol Use No     Social History   Substance and Sexual Activity  Drug Use No    Social History   Socioeconomic History   Marital status: Married    Spouse name: Not on file   Number of children: 2   Years of education: Not on file   Highest education level: Not on file  Occupational History   Not on file  Tobacco Use   Smoking status: Former    Types: Cigarettes    Quit date: 06/07/2011    Years since quitting: 11.2    Passive exposure: Past   Smokeless tobacco: Never   Tobacco comments:    No smoking, does not need nicotine patch or gum  Vaping Use   Vaping Use: Never used  Substance and Sexual Activity   Alcohol use: No   Drug use: No   Sexual activity: Yes    Birth control/protection: None  Other Topics Concern   Not on file  Social  History Narrative   Not on file   Social Determinants of Health   Financial Resource Strain: Not on file  Food Insecurity: No Food Insecurity (04/21/2022)   Hunger Vital Sign    Worried About Running Out of Food in the Last Year: Never true    Ran Out of Food in the Last Year: Never true  Transportation Needs: No Transportation Needs (04/21/2022)   PRAPARE - Administrator, Civil Service (Medical): No    Lack of Transportation (Non-Medical): No  Physical Activity: Not on file  Stress: Not on file  Social Connections: Not on file   SDOH:  SDOH Screenings   Food Insecurity: No Food Insecurity (04/21/2022)  Housing: Low Risk  (  04/21/2022)  Transportation Needs: No Transportation Needs (04/21/2022)  Utilities: Not At Risk (04/21/2022)  Alcohol Screen: Low Risk  (02/26/2022)  Depression (PHQ2-9): High Risk (09/21/2022)  Tobacco Use: Medium Risk (09/19/2022)    Tobacco Cessation:  N/A, patient does not currently use tobacco products  Current Medications:  Current Facility-Administered Medications  Medication Dose Route Frequency Provider Last Rate Last Admin   acetaminophen (TYLENOL) tablet 650 mg  650 mg Oral Q6H PRN Sindy Guadeloupe, NP       alum & mag hydroxide-simeth (MAALOX/MYLANTA) 200-200-20 MG/5ML suspension 30 mL  30 mL Oral Q4H PRN Sindy Guadeloupe, NP       OLANZapine zydis (ZYPREXA) disintegrating tablet 10 mg  10 mg Oral Q8H PRN Sindy Guadeloupe, NP   10 mg at 09/21/22 1227   And   LORazepam (ATIVAN) tablet 1 mg  1 mg Oral PRN Sindy Guadeloupe, NP       And   ziprasidone (GEODON) injection 20 mg  20 mg Intramuscular PRN Sindy Guadeloupe, NP       magnesium hydroxide (MILK OF MAGNESIA) suspension 30 mL  30 mL Oral Daily PRN Sindy Guadeloupe, NP       Current Outpatient Medications  Medication Sig Dispense Refill   Diclofenac Potassium,Migraine, (CAMBIA) 50 MG PACK Take 50 mg by mouth See admin instructions. Administer one packet (50 mg) of CAMBIAT  for the acute treatment of  migraine. Empty the contents of one packet into a cup containing 1 to 2 ounces (30 to 60 mL) of water, mix well and drink immediately. Do not use liquids other than water.     estradiol (VIVELLE-DOT) 0.05 MG/24HR patch Place 1 patch onto the skin 2 (two) times a week.     ketorolac (ACULAR) 0.5 % ophthalmic solution INSTILL 1 DROP INTO THE RIGHT EYE 4 TIMES A DAY AS DIRECTED (Patient taking differently: Place 1 drop into the right eye 4 (four) times daily.) 5 mL 6   lamoTRIgine (LAMICTAL) 25 MG tablet Take 2 tablets (50 mg total) by mouth daily. Taper Lamictal to 50 mg daily for 3 days before discontinuing the medication 6 tablet 0   lithium 300 MG tablet Take 1 tablet (300 mg total) by mouth 2 (two) times daily. 60 tablet 2   Multiple Vitamins-Minerals (PRESERVISION AREDS PO) Take 1 tablet by mouth at bedtime.     nystatin (MYCOSTATIN) 100000 UNIT/ML suspension Take 5 mLs (500,000 Units total) by mouth 4 (four) times daily. 200 mL 1   pregabalin (LYRICA) 50 MG capsule Take 1 capsule (50 mg total) by mouth 2 (two) times daily as needed. (Patient taking differently: Take 50 mg by mouth 2 (two) times daily as needed (For pain).) 60 capsule 2   progesterone (PROMETRIUM) 200 MG capsule Take 200 mg by mouth at bedtime.     Propylene Glycol, PF, (SYSTANE COMPLETE PF) 0.6 % SOLN Place 1 drop into both eyes in the morning, at noon, in the evening, and at bedtime.     rosuvastatin (CRESTOR) 5 MG tablet Take 5 mg by mouth daily.     sertraline (ZOLOFT) 25 MG tablet Take 1 tablet (25 mg total) by mouth daily. 90 tablet 0   traZODone (DESYREL) 50 MG tablet Take 1 tablet (50 mg total) by mouth at bedtime as needed for sleep. 90 tablet 1    PTA Medications: (Not in a hospital admission)       09/21/2022    4:36 AM 08/20/2022    3:03 PM 04/02/2022  10:44 AM  Depression screen PHQ 2/9  Decreased Interest 0 0 2  Down, Depressed, Hopeless 3 0 1  PHQ - 2 Score 3 0 3  Altered sleeping 3 1 2   Tired, decreased  energy 3 0 2  Change in appetite 2 1 3   Feeling bad or failure about yourself  2 0 1  Trouble concentrating 1 0 3  Moving slowly or fidgety/restless 1 0 3  Suicidal thoughts 0 0 0  PHQ-9 Score 15 2 17   Difficult doing work/chores Very difficult      Flowsheet Row ED from 09/21/2022 in [facility] ED to Hosp-Admission (Discharged) from 04/21/2022 in [facility] ED from 04/19/2022 in [facility] Emergency Department at [facility]  C-SSRS RISK CATEGORY Error: Question 2 not populated Error: Q7 should not be populated when Q6 is No Moderate Risk        Psychiatric Specialty Exam  Mental Status Exam:  Appearance and Grooming: Patient appears overall unkempt.  Behavior: The patient appears in no acute distress, and during the interview, was disorganized, as evidenced by nonsensical statements with hyper-religious themes, required frequent redirection, and behaving inappropriately to scenario, as evidenced by taking off clothes in unit. She was able to follow commands and compliant to requests and made good eye contact.  The patient did not appear internally or externally preoccupied.  Attitude: Patient's attitude towards the interviewer and other individuals present was cooperative and open.  Motor activity: There was no notable abnormal facial movements and no notable abnormal extremity movements.  Speech: The volume of her speech was normal and normal in quantity. The rate was normal with a normal rhythm. Responses were normal in latency. There were no abnormal patterns in speech.  Mood: Did not answer question  Affect: Patient's affect is animated with broad range and even fluctuations. ------------------------------------------------------------------------------------------------------------------------- Perception The patient endorses visual hallucinations but cannot describe what she sees  Thought Content The patient  describes persecutory delusions, specifically that her house is haunted and evil spirits are after her and religious delusions, specifically that God is speaking to her to fight the devil, pray for everyone, and that God wants her to be in heaven .  Patient at the time of interview denies active suicidal intent and denies passive suicidal ideation. She denies homicidal intent.  Thought Process The patient's thought process is tangential, going off into various topics unrelated to my line of questioning and is extremely disorganized, as evidenced by overemphasis of religious themes and about her haunted house .  Insight The patient at the time of interview demonstrates poor insight, as evidenced by ability to identify trigger/s causing mental health decompensation, lacking understanding of mental health condition/s, inability to identify trigger/s causing mental health decompensation, and inability to identify adaptive and maladaptive coping strategies.  Judgement The patient over the past 24 hours demonstrates poor judgement, as evidenced by unwillingness to voluntarily seek help / treatment, adhering to psychotropic medication regimen, and engaging inappropriately with staff / other patients.    Assets  Assets: Desire for Improvement    Sleep  Sleep: Sleep: Fair Number of Hours of Sleep: 6    Nutritional Assessment (For OBS and [facility]BC admissions only) Has the patient had a weight loss or gain of 10 pounds or more in the last 3 months?: No Has the patient had a decrease in food intake/or appetite?: No Does the patient have dental problems?: No Does the patient have eating habits or behaviors  that may be indicators of an eating disorder including binging or inducing vomiting?: No Has the patient recently lost weight without trying?: 0 Has the patient been eating poorly because of a decreased appetite?: 0 Malnutrition Screening Tool Score: 0     Physical Exam  Physical  Exam Vitals and nursing note reviewed.  Constitutional:      Appearance: Normal appearance.  HENT:     Head: Normocephalic and atraumatic.  Pulmonary:     Effort: Pulmonary effort is normal.  Neurological:     Mental Status: She is alert.    Review of Systems  Reason unable to perform ROS: patient extremely disorganized.   Blood pressure 108/60, pulse 90, temperature 98.4 F (36.9 C), temperature source Oral, resp. rate 16, SpO2 99 %. There is no height or weight on file to calculate BMI.  Demographic Factors:  Caucasian  Loss Factors: NA  Historical Factors: Prior suicide attempts, Family history of suicide, Family history of mental illness or substance abuse, and Impulsivity  Risk Reduction Factors:   Religious beliefs about death, Living with another person, especially a relative, and Positive social support  Continued Clinical Symptoms:  More than one psychiatric diagnosis Previous Psychiatric Diagnoses and Treatments  Cognitive Features That Contribute To Risk:  Loss of executive function    Suicide Risk:  Moderate:  Frequent suicidal ideation with limited intensity, and duration, some specificity in terms of plans, no associated intent, good self-control, limited dysphoria/symptomatology, some risk factors present, and identifiable protective factors, including available and accessible social support.  Plan Of Care/Follow-up recommendations: Transfer to [facility] hospital  Assessment and Rationale for Discharge: Patient is exhibiting psychotic and manic symptoms. She has a prior history of suicide attempts via overdose and is unwilling to voluntarily receive treatment at an inpatient psychiatric hospital.  The etiology of her presentation is strange as she reportedly has no past psychiatric history and psychosis / mania does not usually present at this advanced age without a triggering factor. She does not consume recreational substances so a substance induced  mood/psychotic disorder is unlikely.  Of note, her husband noted these symptoms only began after she began menopause 1-2 years ago. She was also started on hormone replacement therapy at some point so this could be a contributing factor. These factors will need to be explored during her time at inpatient psychiatry.  I recommend restarting the patient on a medication regimen for mania/psychosis and to stop taking her hormone replacement therapy for now.  Activity:  as tolerated Diet:  regular diet  Disposition: [facility]  I discussed my assessment and planned treatment for the patient with Dr. Lucianne Bishop who agrees with my formulated course of action.  Augusto Gamble, MD [facility] Psychiatry Intern (PGY-1) 09/21/22  1:09 PM

## 2022-09-21 NOTE — ED Provider Notes (Signed)
[facility]/OBS ASAP Discharge Summary  Date and Time: 09/21/2022 1:09 PM  Name: Christine Bishop  MRN:  [id number]   Discharge Diagnoses:  Final diagnoses:  Manic behavior (HCC)  Bipolar affective disorder, current episode manic, current episode severity unspecified (HCC)    Christine Bishop is a 56 y.o. female with a PMHx of MDD and GAD  who was admitted involuntarily as a direct admit brought in by EMS from the community [facility] (admitted on 09/21/2022, total  LOS: 0 days ) for agitation and erratic behavior.  Subjective: Per HPI: "Christine Bishop 56 y/o female with a history of manic behavior, bipolar disorder, anxiety, depression and suicidal ideation.  Presented to [facility] via [facility].  Patient is very manic pressured speech, flight of ideas very hyper religious.  Patient very fixated on she needs to prefer the world because the world is about and," I think the end of the world is near, I want to pray like Solomon who is my ancestors.  Patient is not a good historian and it is very difficult to gather valid information from the patient at this time.   Patient was given 2 mg of Ativan because of erratic behavior shouting and screaming.  Patient did allow the nurse to give Ativan without any hold.   Face-to-face observation of patient, patient is alert and oriented to person.  Very manic, pressured speech, word salad, very hyper religious constantly talking about the end of the world and she need to pray for everyone.  Unable to stay focused and unable to answer questions appropriately.  Patient will need to be reassessed after she calms down.  Unable to properly assess patient at this time   Review of records show that patient was seen in walk-in psychiatry on 09/19/22.  At that time patient presented with racing thoughts flight of ideas patient did appear very disorganized patient was started on lithium 300 mg twice a day and was supposed to be tapered off of Lamictal.  It  is unsure if patient is compliant with regiment because patient is disorganized at this time and cannot answer questions appropriately.     Recommend inpatient observation."  Patient's narrative on day of discharge: I saw the patient today in the presence of Dr. Kumar and PA student Tyler.  Patient stated she was brought here due to an overdose on trazodone, and that her husband called the ambulance. Patient kept speaking about religious themes consisting of God, Satan, and heaven. Patient also spoke about her house being haunted.   Patient stated she has not slept in 1 - 2 weeks. Says she is no longer feeling suicidal at this time, but did feel suicidal before she came in. She feels her memory has not been good. Patient says she lives with her husband and daughter.  She gave us permission to speak to both the husband and daughter.      Collateral: Spoke with patients husband. Husband stated patient began having symptoms of depression beginning last Fall. In December 2024, patient was started on lithium and she was doing well on it until she decided to self-discontinue due to tinnitus and hair loss.  Over the course of the month, patient reportedly began exhibiting bizzare behavior around the house prompting the family to see Dr. Burgess who restarted her on lithium (09/19/2022) at the same time, patient also continued to take her hormone replacement therapy.  Last night, she became agitated, confused, screaming, and "talking out of her head."   This caused concern and prompted the family to call in an ambulance.  Husband believes patient's symptoms began when she started going through menopause (1 - 2 years ago) and when she was taking HRT. He says they have been married for 26 years and he is not aware of any mental health conditions in the past.  He states patient hasn't drank since she was a teenager. He confirms she does not use any recreational drugs. With regards to safety, he says his son  in law has gun, but does keep it secure in the house inside a safe.  Per husband: Family hx: father suicide attempt via, both parents drink Social: lives with husband, daughter  Stay Summary: Behavior during stay: bizarre. Not violent or disruptive Medications during stay: received olanzapine x2  Total Time spent with patient: 1.5 hours  Past Psychiatric and Medical History:  Past Medical History:  Diagnosis Date   Anxiety    Dysplastic nevus 12/24/2019   R upper back paraspinal - moderate   Dysplastic nevus 12/24/2019   R to mid upper back 3.0 cm lat to spine above braline - mild   History of migraine headaches    Posterior vitreous detachment of left eye 12/14/2019   Vitreous membranes and strands, left 04/20/2020   Vitrectomy left eye 04-27-20    Past Surgical History:  Procedure Laterality Date   CHOLECYSTECTOMY OPEN     LASIK     LEEP     Family History:  Family History  Problem Relation Age of Onset   Cancer Mother    Hypertension Father    Hyperlipidemia Father    Cancer Maternal Grandmother    Family Psychiatric History:  as above Social History:  Social History   Substance and Sexual Activity  Alcohol Use No     Social History   Substance and Sexual Activity  Drug Use No    Social History   Socioeconomic History   Marital status: Married    Spouse name: Not on file   Number of children: 2   Years of education: Not on file   Highest education level: Not on file  Occupational History   Not on file  Tobacco Use   Smoking status: Former    Types: Cigarettes    Quit date: 06/07/2011    Years since quitting: 11.2    Passive exposure: Past   Smokeless tobacco: Never   Tobacco comments:    No smoking, does not need nicotine patch or gum  Vaping Use   Vaping Use: Never used  Substance and Sexual Activity   Alcohol use: No   Drug use: No   Sexual activity: Yes    Birth control/protection: None  Other Topics Concern   Not on file  Social  History Narrative   Not on file   Social Determinants of Health   Financial Resource Strain: Not on file  Food Insecurity: No Food Insecurity (04/21/2022)   Hunger Vital Sign    Worried About Running Out of Food in the Last Year: Never true    Ran Out of Food in the Last Year: Never true  Transportation Needs: No Transportation Needs (04/21/2022)   PRAPARE - Transportation    Lack of Transportation (Medical): No    Lack of Transportation (Non-Medical): No  Physical Activity: Not on file  Stress: Not on file  Social Connections: Not on file   SDOH:  SDOH Screenings   Food Insecurity: No Food Insecurity (04/21/2022)  Housing: Low Risk  (  04/21/2022)  Transportation Needs: No Transportation Needs (04/21/2022)  Utilities: Not At Risk (04/21/2022)  Alcohol Screen: Low Risk  (02/26/2022)  Depression (PHQ2-9): High Risk (09/21/2022)  Tobacco Use: Medium Risk (09/19/2022)    Tobacco Cessation:  N/A, patient does not currently use tobacco products  Current Medications:  Current Facility-Administered Medications  Medication Dose Route Frequency Provider Last Rate Last Admin   acetaminophen (TYLENOL) tablet 650 mg  650 mg Oral Q6H PRN Williams, Roy, NP       alum & mag hydroxide-simeth (MAALOX/MYLANTA) 200-200-20 MG/5ML suspension 30 mL  30 mL Oral Q4H PRN Williams, Roy, NP       OLANZapine zydis (ZYPREXA) disintegrating tablet 10 mg  10 mg Oral Q8H PRN Williams, Roy, NP   10 mg at 09/21/22 1227   And   LORazepam (ATIVAN) tablet 1 mg  1 mg Oral PRN Williams, Roy, NP       And   ziprasidone (GEODON) injection 20 mg  20 mg Intramuscular PRN Williams, Roy, NP       magnesium hydroxide (MILK OF MAGNESIA) suspension 30 mL  30 mL Oral Daily PRN Williams, Roy, NP       Current Outpatient Medications  Medication Sig Dispense Refill   Diclofenac Potassium,Migraine, (CAMBIA) 50 MG PACK Take 50 mg by mouth See admin instructions. Administer one packet (50 mg) of CAMBIAT  for the acute treatment of  migraine. Empty the contents of one packet into a cup containing 1 to 2 ounces (30 to 60 mL) of water, mix well and drink immediately. Do not use liquids other than water.     estradiol (VIVELLE-DOT) 0.05 MG/24HR patch Place 1 patch onto the skin 2 (two) times a week.     ketorolac (ACULAR) 0.5 % ophthalmic solution INSTILL 1 DROP INTO THE RIGHT EYE 4 TIMES A DAY AS DIRECTED (Patient taking differently: Place 1 drop into the right eye 4 (four) times daily.) 5 mL 6   lamoTRIgine (LAMICTAL) 25 MG tablet Take 2 tablets (50 mg total) by mouth daily. Taper Lamictal to 50 mg daily for 3 days before discontinuing the medication 6 tablet 0   lithium 300 MG tablet Take 1 tablet (300 mg total) by mouth 2 (two) times daily. 60 tablet 2   Multiple Vitamins-Minerals (PRESERVISION AREDS PO) Take 1 tablet by mouth at bedtime.     nystatin (MYCOSTATIN) 100000 UNIT/ML suspension Take 5 mLs (500,000 Units total) by mouth 4 (four) times daily. 200 mL 1   pregabalin (LYRICA) 50 MG capsule Take 1 capsule (50 mg total) by mouth 2 (two) times daily as needed. (Patient taking differently: Take 50 mg by mouth 2 (two) times daily as needed (For pain).) 60 capsule 2   progesterone (PROMETRIUM) 200 MG capsule Take 200 mg by mouth at bedtime.     Propylene Glycol, PF, (SYSTANE COMPLETE PF) 0.6 % SOLN Place 1 drop into both eyes in the morning, at noon, in the evening, and at bedtime.     rosuvastatin (CRESTOR) 5 MG tablet Take 5 mg by mouth daily.     sertraline (ZOLOFT) 25 MG tablet Take 1 tablet (25 mg total) by mouth daily. 90 tablet 0   traZODone (DESYREL) 50 MG tablet Take 1 tablet (50 mg total) by mouth at bedtime as needed for sleep. 90 tablet 1    PTA Medications: (Not in a hospital admission)       09/21/2022    4:36 AM 08/20/2022    3:03 PM 04/02/2022     10:44 AM  Depression screen PHQ 2/9  Decreased Interest 0 0 2  Down, Depressed, Hopeless 3 0 1  PHQ - 2 Score 3 0 3  Altered sleeping 3 1 2  Tired, decreased  energy 3 0 2  Change in appetite 2 1 3  Feeling bad or failure about yourself  2 0 1  Trouble concentrating 1 0 3  Moving slowly or fidgety/restless 1 0 3  Suicidal thoughts 0 0 0  PHQ-9 Score 15 2 17  Difficult doing work/chores Very difficult      Flowsheet Row ED from 09/21/2022 in [facility] ED to Hosp-Admission (Discharged) from 04/21/2022 in [facility] from 04/19/2022 in [place] [facility]  C-SSRS RISK CATEGORY Error: Question 2 not populated Error: Q7 should not be populated when Q6 is No Moderate Risk        Psychiatric Specialty Exam  Mental Status Exam:  Appearance and Grooming: Patient appears overall unkempt.  Behavior: The patient appears in no acute distress, and during the interview, was disorganized, as evidenced by nonsensical statements with hyper-religious themes, required frequent redirection, and behaving inappropriately to scenario, as evidenced by taking off clothes in unit. She was able to follow commands and compliant to requests and made good eye contact.  The patient did not appear internally or externally preoccupied.  Attitude: Patient's attitude towards the interviewer and other individuals present was cooperative and open.  Motor activity: There was no notable abnormal facial movements and no notable abnormal extremity movements.  Speech: The volume of her speech was normal and normal in quantity. The rate was normal with a normal rhythm. Responses were normal in latency. There were no abnormal patterns in speech.  Mood: Did not answer question  Affect: Patient's affect is animated with broad range and even fluctuations. ------------------------------------------------------------------------------------------------------------------------- Perception The patient endorses visual hallucinations but cannot describe what she sees  Thought Content The patient  describes persecutory delusions, specifically that her house is haunted and evil spirits are after her and religious delusions, specifically that God is speaking to her to fight the devil, pray for everyone, and that God wants her to be in heaven .  Patient at the time of interview denies active suicidal intent and denies passive suicidal ideation. She denies homicidal intent.  Thought Process The patient's thought process is tangential, going off into various topics unrelated to my line of questioning and is extremely disorganized, as evidenced by overemphasis of religious themes and about her haunted house .  Insight The patient at the time of interview demonstrates poor insight, as evidenced by ability to identify trigger/s causing mental health decompensation, lacking understanding of mental health condition/s, inability to identify trigger/s causing mental health decompensation, and inability to identify adaptive and maladaptive coping strategies.  Judgement The patient over the past 24 hours demonstrates poor judgement, as evidenced by unwillingness to voluntarily seek help / treatment, adhering to psychotropic medication regimen, and engaging inappropriately with staff / other patients.    Assets  Assets: Desire for Improvement    Sleep  Sleep: Sleep: Fair Number of Hours of Sleep: 6    Nutritional Assessment (For OBS and [facility] admissions only) Has the patient had a weight loss or gain of 10 pounds or more in the last 3 months?: No Has the patient had a decrease in food intake/or appetite?: No Does the patient have dental problems?: No Does the patient have eating habits or behaviors   that may be indicators of an eating disorder including binging or inducing vomiting?: No Has the patient recently lost weight without trying?: 0 Has the patient been eating poorly because of a decreased appetite?: 0 Malnutrition Screening Tool Score: 0     Physical Exam  Physical  Exam Vitals and nursing note reviewed.  Constitutional:      Appearance: Normal appearance.  HENT:     Head: Normocephalic and atraumatic.  Pulmonary:     Effort: Pulmonary effort is normal.  Neurological:     Mental Status: She is alert.    Review of Systems  Reason unable to perform ROS: patient extremely disorganized.   Blood pressure 108/60, pulse 90, temperature 98.4 F (36.9 C), temperature source Oral, resp. rate 16, SpO2 99 %. There is no height or weight on file to calculate BMI.  Demographic Factors:  Caucasian  Loss Factors: NA  Historical Factors: Prior suicide attempts, Family history of suicide, Family history of mental illness or substance abuse, and Impulsivity  Risk Reduction Factors:   Religious beliefs about death, Living with another person, especially a relative, and Positive social support  Continued Clinical Symptoms:  More than one psychiatric diagnosis Previous Psychiatric Diagnoses and Treatments  Cognitive Features That Contribute To Risk:  Loss of executive function    Suicide Risk:  Moderate:  Frequent suicidal ideation with limited intensity, and duration, some specificity in terms of plans, no associated intent, good self-control, limited dysphoria/symptomatology, some risk factors present, and identifiable protective factors, including available and accessible social support.  Plan Of Care/Follow-up recommendations: Transfer to [facility] hospital  Assessment and Rationale for Discharge: Patient is exhibiting psychotic and manic symptoms. She has a prior history of suicide attempts via overdose and is unwilling to voluntarily receive treatment at an inpatient psychiatric hospital.  The etiology of her presentation is strange as she reportedly has no past psychiatric history and psychosis / mania does not usually present at this advanced age without a triggering factor. She does not consume recreational substances so a substance induced  mood/psychotic disorder is unlikely.  Of note, her husband noted these symptoms only began after she began menopause 1-2 years ago. She was also started on hormone replacement therapy at some point so this could be a contributing factor. These factors will need to be explored during her time at inpatient psychiatry.  I recommend restarting the patient on a medication regimen for mania/psychosis and to stop taking her hormone replacement therapy for now.  Activity:  as tolerated Diet:  regular diet  Disposition: [facility]  I discussed my assessment and planned treatment for the patient with Dr. Kumar who agrees with my formulated course of action.  Iver Miklas, MD [facility] Intern (PGY-1) 09/21/22  1:09 PM    for now.   Activity:  as tolerated Diet:  regular diet   Disposition: [facility]   I discussed my assessment and planned treatment for the patient with Dr. Lucianne Muss who agrees with my formulated course of action.   Augusto Gamble, MD [facility] Psychiat[facility] Intern (PGY-1) 09/21/22  1:09 PM

## 2022-09-21 NOTE — ED Notes (Signed)
Pt was unable to complete vitals or [place] suicide rating scale due to pt current condition.

## 2022-09-22 ENCOUNTER — Encounter (HOSPITAL_COMMUNITY): Payer: Self-pay | Admitting: Psychiatry

## 2022-09-22 ENCOUNTER — Other Ambulatory Visit: Payer: Self-pay

## 2022-09-22 ENCOUNTER — Inpatient Hospital Stay (HOSPITAL_COMMUNITY)
Admission: AD | Admit: 2022-09-22 | Discharge: 2022-09-27 | DRG: 885 | Disposition: A | Discharge status: Home / Self Care | Payer: Federal, State, Local not specified - PPO | Source: Intra-hospital | Attending: Psychiatry | Admitting: Psychiatry

## 2022-09-22 DIAGNOSIS — K30 Functional dyspepsia: Secondary | ICD-10-CM | POA: Diagnosis present

## 2022-09-22 DIAGNOSIS — Z87891 Personal history of nicotine dependence: Secondary | ICD-10-CM | POA: Diagnosis not present

## 2022-09-22 DIAGNOSIS — Z634 Disappearance and death of family member: Secondary | ICD-10-CM

## 2022-09-22 DIAGNOSIS — G4700 Insomnia, unspecified: Secondary | ICD-10-CM | POA: Diagnosis present

## 2022-09-22 DIAGNOSIS — Z79899 Other long term (current) drug therapy: Secondary | ICD-10-CM | POA: Diagnosis not present

## 2022-09-22 DIAGNOSIS — F411 Generalized anxiety disorder: Secondary | ICD-10-CM | POA: Diagnosis not present

## 2022-09-22 DIAGNOSIS — G43909 Migraine, unspecified, not intractable, without status migrainosus: Secondary | ICD-10-CM | POA: Diagnosis present

## 2022-09-22 DIAGNOSIS — K5900 Constipation, unspecified: Secondary | ICD-10-CM | POA: Diagnosis present

## 2022-09-22 DIAGNOSIS — Z9151 Personal history of suicidal behavior: Secondary | ICD-10-CM | POA: Diagnosis not present

## 2022-09-22 DIAGNOSIS — F3160 Bipolar disorder, current episode mixed, unspecified: Secondary | ICD-10-CM | POA: Diagnosis present

## 2022-09-22 DIAGNOSIS — F29 Unspecified psychosis not due to a substance or known physiological condition: Secondary | ICD-10-CM | POA: Diagnosis present

## 2022-09-22 DIAGNOSIS — Z818 Family history of other mental and behavioral disorders: Secondary | ICD-10-CM

## 2022-09-22 DIAGNOSIS — F3164 Bipolar disorder, current episode mixed, severe, with psychotic features: Secondary | ICD-10-CM | POA: Diagnosis not present

## 2022-09-22 MED ORDER — DIPHENHYDRAMINE HCL 25 MG PO CAPS
50.0000 mg | ORAL_CAPSULE | Freq: Three times a day (TID) | ORAL | Status: DC | PRN
  Administered 2022-09-23: 50 mg via ORAL
  Filled 2022-09-22: qty 2

## 2022-09-22 MED ORDER — LORAZEPAM 1 MG PO TABS
2.0000 mg | ORAL_TABLET | Freq: Three times a day (TID) | ORAL | Status: DC | PRN
  Administered 2022-09-23: 2 mg via ORAL
  Filled 2022-09-22: qty 2

## 2022-09-22 MED ORDER — LORAZEPAM 2 MG/ML IJ SOLN: 2.0000 mg | Freq: Three times a day (TID) | INTRAMUSCULAR | Status: DC | PRN

## 2022-09-22 MED ORDER — MAGNESIUM HYDROXIDE 400 MG/5ML PO SUSP: 30.0000 mL | Freq: Every day | ORAL | Status: DC | PRN

## 2022-09-22 MED ORDER — LORAZEPAM 1 MG PO TABS
1.0000 mg | ORAL_TABLET | Freq: Two times a day (BID) | ORAL | Status: AC
  Administered 2022-09-22 – 2022-09-24 (×4): 1 mg via ORAL
  Filled 2022-09-22 (×4): qty 1

## 2022-09-22 MED ORDER — HALOPERIDOL 5 MG PO TABS
5.0000 mg | ORAL_TABLET | Freq: Three times a day (TID) | ORAL | Status: DC | PRN
  Administered 2022-09-23: 5 mg via ORAL
  Filled 2022-09-22: qty 1

## 2022-09-22 MED ORDER — ALUM & MAG HYDROXIDE-SIMETH 200-200-20 MG/5ML PO SUSP
30.0000 mL | ORAL | Status: DC | PRN
  Administered 2022-09-27 (×2): 30 mL via ORAL
  Filled 2022-09-22 (×2): qty 30

## 2022-09-22 MED ORDER — TRAZODONE HCL 50 MG PO TABS
50.0000 mg | ORAL_TABLET | Freq: Every day | ORAL | Status: DC
  Administered 2022-09-22 – 2022-09-23 (×2): 50 mg via ORAL
  Filled 2022-09-22 (×6): qty 1

## 2022-09-22 MED ORDER — SUMATRIPTAN SUCCINATE 25 MG PO TABS
25.0000 mg | ORAL_TABLET | ORAL | Status: DC | PRN
  Administered 2022-09-23: 25 mg via ORAL
  Filled 2022-09-22: qty 1

## 2022-09-22 MED ORDER — LITHIUM CARBONATE 300 MG PO CAPS
300.0000 mg | ORAL_CAPSULE | Freq: Two times a day (BID) | ORAL | Status: DC
  Administered 2022-09-22 – 2022-09-24 (×4): 300 mg via ORAL
  Filled 2022-09-22 (×8): qty 1

## 2022-09-22 MED ORDER — ACETAMINOPHEN 325 MG PO TABS
650.0000 mg | ORAL_TABLET | Freq: Four times a day (QID) | ORAL | Status: DC | PRN
  Administered 2022-09-27: 650 mg via ORAL
  Filled 2022-09-22: qty 2

## 2022-09-22 MED ORDER — HYDROXYZINE HCL 25 MG PO TABS
25.0000 mg | ORAL_TABLET | Freq: Three times a day (TID) | ORAL | Status: DC | PRN
  Administered 2022-09-24 – 2022-09-26 (×4): 25 mg via ORAL
  Filled 2022-09-22 (×4): qty 1

## 2022-09-22 MED ORDER — HALOPERIDOL LACTATE 5 MG/ML IJ SOLN: 5.0000 mg | Freq: Three times a day (TID) | INTRAMUSCULAR | Status: DC | PRN

## 2022-09-22 MED ORDER — DIPHENHYDRAMINE HCL 50 MG/ML IJ SOLN: 50.0000 mg | Freq: Three times a day (TID) | INTRAMUSCULAR | Status: DC | PRN

## 2022-09-22 MED ORDER — ARIPIPRAZOLE 5 MG PO TABS
5.0000 mg | ORAL_TABLET | Freq: Every day | ORAL | Status: DC
  Administered 2022-09-23 – 2022-09-24 (×2): 5 mg via ORAL
  Filled 2022-09-22 (×4): qty 1

## 2022-09-22 NOTE — Discharge Instructions (Addendum)
Transfer to [facility] 

## 2022-09-22 NOTE — ED Notes (Signed)
Report called to [employer] at [facility] Called [employer] for transfer of patient since she is IVC'ed Patient is aware.

## 2022-09-22 NOTE — ED Notes (Signed)
Pt was given pain medication(see Mar). Will continue to monitor for safety

## 2022-09-22 NOTE — Progress Notes (Addendum)
Pacing hallway and singing hymns. Intermittent crying spells.
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
## 2022-09-22 NOTE — Progress Notes (Addendum)
Patient seen and evaluated by this provider this morning prior to transfer to [facility]. Patient presents with disorganized thoughts and hyper religious and delusional thought content. She denies SI. She denies HI. She denies AVH. She reports only sleeping 3 hours last night due to nightmares. She reports a poor appetite and states that it varies. She c/o a headache this morning and states that she takes Excedrin for migraines. Patient offered tylenol. She states that she is feeling pretty anxious this morning. She is unable to express why she's increasingly anxious this morning. She was offered vistaril for her anxiety.
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
## 2022-09-22 NOTE — Progress Notes (Signed)
Adult Psychoeducational Group Note  Date:  09/22/2022 Time:  9:30 PM  Group Topic/Focus:  Wrap-Up Group:   The focus of this group is to help patients review their daily goal of treatment and discuss progress on daily workbooks.  Participation Level:  Active  Participation Quality:  Appropriate  Affect:  Appropriate  Cognitive:  Appropriate  Insight: Appropriate  Engagement in Group:  Engaged  Modes of Intervention:  Discussion  Additional Comments:  Odell said the first she thought about was memories when she saw movie.  Charna Busman Long 09/22/2022, 9:30 PM

## 2022-09-22 NOTE — ED Notes (Signed)
Discharge instructions provided and Pt stated understanding. Pt alert, orient and ambulatory prior to d/c from facility. Personal belongings returned from locker. Report previously called to [facility]. Transferred to [facility] via [facility]. Patient escorted to the sally port to discharge from facility. Safety maintained.

## 2022-09-22 NOTE — H&P (Addendum)
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 identify and develop effective coping behaviors will improve, Ability to maintain clinical measurements within normal limits will improve, and Compliance with prescribed medications will improve  Medications:  Principal Problem:   Bipolar I disorder, most recent episode mixed, severe with psychotic features (HCC) Active Problems:   Migraines   Insomnia   GAD (generalized anxiety disorder)  Medications -Start Ativan 1 mg BIDx2 days, then stop-For GAD (hold if sedated) -Start Abilify 5 mg daily for mood stabilization -Continue Lithium 300 mg twice daily for mood stabilization -Continue hydroxyzine 25 mg 3 times daily for anxiety -Continue trazodone 50 mg nightly as needed for sleep -Start Imitrex 25 mg PRN for migraines   Other PRNS -Continue Tylenol  650 mg every 6 hours PRN for mild pain -Continue Maalox 30 mg every 4 hrs PRN for indigestion -Continue Milk of Magnesia as needed every 6 hrs for constipation  Discharge Planning: Social work and case management to assist with discharge planning and identification of hospital follow-up needs prior to discharge Estimated LOS: 5-7 days Discharge Concerns: Need to establish a safety plan; Medication compliance and effectiveness Discharge Goals: Return home with outpatient referrals for mental health follow-up including medication management/psychotherapy  I certify that inpatient services furnished can reasonably be expected to improve the patient's condition.    Starleen Blue, NP 5/4/20245:35 PM

## 2022-09-22 NOTE — ED Notes (Signed)
Patient denied SI/HI. Patient stated that she does hear voices but can't make out what they are saying this morning. Patient converses with other patient on the unit and staff. Safety maintained and will continue to monitor.

## 2022-09-22 NOTE — Progress Notes (Signed)
Patient was accepted by previous AC to [facility]. Accepting: Dr Lucianne Muss Attending: Dr Sherron Flemings  Patient can arrive at 8am.    Please call report to [phone].  Notifed: Rodney Langton, LPN
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
## 2022-09-23 DIAGNOSIS — F3164 Bipolar disorder, current episode mixed, severe, with psychotic features: Principal | ICD-10-CM

## 2022-09-23 LAB — HEMOGLOBIN A1C
Hgb A1c MFr Bld: 4.9 % (ref 4.8–5.6)
Mean Plasma Glucose: 93.93 mg/dL

## 2022-09-23 LAB — SEDIMENTATION RATE: Sed Rate: 8 mm/hr (ref 0–22)

## 2022-09-23 LAB — VITAMIN D 25 HYDROXY (VIT D DEFICIENCY, FRACTURES): Vit D, 25-Hydroxy: 45.13 ng/mL (ref 30–100)

## 2022-09-23 LAB — LITHIUM LEVEL: Lithium Lvl: 0.27 mmol/L — ABNORMAL LOW (ref 0.60–1.20)

## 2022-09-23 NOTE — Progress Notes (Signed)
   09/23/22 1012  Psych Admission Type (Psych Patients Only)  Admission Status Involuntary  Psychosocial Assessment  Patient Complaints Anxiety  Eye Contact Fair  Facial Expression Anxious  Affect Appropriate to circumstance;Angry  Speech Logical/coherent  Interaction Assertive  Motor Activity Slow  Appearance/Hygiene Unremarkable  Behavior Characteristics Cooperative  Mood Anxious;Pleasant  Thought Process  Coherency WDL  Content Religiosity  Delusions None reported or observed  Perception WDL  Hallucination None reported or observed  Judgment Poor  Confusion None  Danger to Self  Current suicidal ideation? Denies  Self-Injurious Behavior No self-injurious ideation or behavior indicators observed or expressed   Agreement Not to Harm Self Yes  Description of Agreement verbal  Danger to Others  Danger to Others None reported or observed

## 2022-09-23 NOTE — Progress Notes (Signed)
[facility] MD Progress Note  09/23/2022 12:52 PM Christine Bishop  MRN:  [id number] Subjective:    Christine Bishop is a 57 year old, Caucasian female with a past psychiatric history significant for bipolar disorder, major depressive disorder, and generalized anxiety disorder who presented to [facility] on 5/3 by way of EMS after her husband called 911 to report that she was acting bizarrely, screaming, lashing out, and crying stating: "I want God to take me to heaven right now."  Patient was involuntarily committed prior to being transferred to [facility] for treatment and stabilization of her mental status.  Patient to be assessed by Karel Jarvis, PA-C for reevaluation.  Patient is currently being managed on the following psychiatric medications:  Abilify 5 mg daily Hydroxyzine 25 mg 3 times daily as needed Lithium 300 mg 2 times daily with meals Lorazepam 1 mg 2 times daily for 2 days Trazodone 50 mg at bedtime  Agitation protocol  -Haldol 5 mg, PO or Inj, 3 times daily as needed for agitation AND  -Lorazepam 2 mg, PO or Inj, 3 times daily as needed for agitation AND  -Diphenhydramine 50 mg, PO or Inj, 3 times daily as needed for agitation  24-hour chart review: Prior to admission to [facility], patient presented with disorganized thoughts and was hyperreligious with delusional thought content.  Patient denied suicidal or homicidal ideations at this time.  She further denied auditory or visual hallucinations prior to transfer.  She reported only sleeping 3 hours last night due to nightmares.  Patient also endorsed poor appetite.  Once admitted to [facility], patient was seen pacing the hallway and singing hymns along with intermittent crying spells.  Patient was given Benadryl, Ativan, and Haldol after experiencing a panic attack yesterday.  During her evaluation, patient reported that she was doing much better today.  She reports that her  thoughts are almost clear and states, although she reports that she occasionally hears fights when out in the hallway.  Patient reports that she has been taking her medications regularly and denies issues or concerns regarding her current regimen.  Patient endorses minimal depression but states that she occasionally feels angry and resentful when people do not do the things she wants him to do.  Patient also reported that she hears Satan and God telling her things.  She reports that God is preparing her for something based on what she sees on TV.  She also states that God tells her to be weary of those she talks to.  Patient endorses anxiety and attributes her anxiety to missing her brother-in-law's funeral and deciding on whether or not she wants to retire.  Patient is partially alert and oriented, calm, cooperative, and engaged in conversation during the encounter.  Patient describes her mood as tired.  Patient maintains good eye contact.  Patient's speech is clear, coherent, and with normal rate.  Patient's thought process is disorganized.  Patient's thought content is illogical and characterized by paranoia and delusions.  Patient endorses depression and anxiety with congruent affect.  Patient denies suicidal or homicidal ideations.  She further denies auditory or visual hallucinations and does not appear to be responding to internal or external stimuli.  She endorses paranoia as well as delusional thoughts characterized by feeling that someone is moving her things around without her knowing.  Patient is unsure of the amount of hours she receives for sleep but states that she does not feel tired.  Patient endorses good  appetite and eats on average 3 meals per day.  Principal Problem: Bipolar I disorder, most recent episode mixed, severe with psychotic features (HCC) Diagnosis: Principal Problem:   Bipolar I disorder, most recent episode mixed, severe with psychotic features (HCC) Active Problems:    Migraines   Insomnia   GAD (generalized anxiety disorder)  Total Time spent with patient: 20 minutes  Past Psychiatric History:  Previous Psych Diagnoses: MDD, GAD, bipolar disorder Prior inpatient treatment:  As per documentation by outpatient Psychiatrist, Dr Mercy Riding, "[facility] in May 2023 after having delusions in the setting of discontinuing Topamax. She was also hospitalized in 08/2021 after developing acute psychosis in the setting of Zonegran withdrawal. Hospitalized to [facility] in October of 2023 after a suicide attempt via trazodone overdose. Most recently hospitalized to [facility] on 12/1 due to anxiety. She was found to be hypotensive/septic at thtat time and was transferred to [facility] long for medical management. Following stabilization patient was admitted to [facility]."   Current/prior outpatient treatment: Dr. Everlena Cooper with [facility] on [address]. Prior rehab hx: Denies  Psychotherapy ZO:XWRUE Beverely Pace as above  History of suicide attempt: OD on Trazodone in 2023 requiring hospitalization. History of homicide or aggression: Denies  Past Medical History:  Past Medical History:  Diagnosis Date   Anxiety    Dysplastic nevus 12/24/2019   R upper back paraspinal - moderate   Dysplastic nevus 12/24/2019   R to mid upper back 3.0 cm lat to spine above braline - mild   History of migraine headaches    Posterior vitreous detachment of left eye 12/14/2019   Vitreous membranes and strands, left 04/20/2020   Vitrectomy left eye 04-27-20    Past Surgical History:  Procedure Laterality Date   CHOLECYSTECTOMY OPEN     LASIK     LEEP     Family History:  Family History  Problem Relation Age of Onset   Cancer Mother    Hypertension Father    Hyperlipidemia Father    Cancer Maternal Grandmother    Family Psychiatric  History:  Psych: Parents had MDD and GAD Psych Rx: Unsure unsure  SA/HA: Denies Substance use family hx: Alcoholism in parents  Social History:  Social  History   Substance and Sexual Activity  Alcohol Use No     Social History   Substance and Sexual Activity  Drug Use No    Social History   Socioeconomic History   Marital status: Married    Spouse name: Fikisha Manzoor   Number of children: 2   Years of education: Not on file   Highest education level: Not on file  Occupational History   Not on file  Tobacco Use   Smoking status: Former    Types: Cigarettes    Quit date: 06/07/2011    Years since quitting: 11.3    Passive exposure: Past   Smokeless tobacco: Never   Tobacco comments:    No smoking, does not need nicotine patch or gum  Vaping Use   Vaping Use: Never used  Substance and Sexual Activity   Alcohol use: No   Drug use: No   Sexual activity: Yes    Birth control/protection: None  Other Topics Concern   Not on file  Social History Narrative   Not on file   Social Determinants of Health   Financial Resource Strain: Not on file  Food Insecurity: No Food Insecurity (09/22/2022)   Hunger Vital Sign    Worried About Running Out  of Food in the Last Year: Never true    Ran Out of Food in the Last Year: Never true  Transportation Needs: No Transportation Needs (09/22/2022)   PRAPARE - Administrator, Civil Service (Medical): No    Lack of Transportation (Non-Medical): No  Physical Activity: Not on file  Stress: Not on file  Social Connections: Not on file   Additional Social History:  See H&P  Sleep: Fair  Appetite:  Good  Current Medications: Current Facility-Administered Medications  Medication Dose Route Frequency Provider Last Rate Last Admin   acetaminophen (TYLENOL) tablet 650 mg  650 mg Oral Q6H PRN Augusto Gamble, MD       alum & mag hydroxide-simeth (MAALOX/MYLANTA) 200-200-20 MG/5ML suspension 30 mL  30 mL Oral Q4H PRN Augusto Gamble, MD       ARIPiprazole (ABILIFY) tablet 5 mg  5 mg Oral Daily Starleen Blue, NP   5 mg at 09/23/22 0750   diphenhydrAMINE (BENADRYL) capsule 50 mg   50 mg Oral TID PRN Augusto Gamble, MD   50 mg at 09/23/22 0019   Or   diphenhydrAMINE (BENADRYL) injection 50 mg  50 mg Intramuscular TID PRN Augusto Gamble, MD       haloperidol (HALDOL) tablet 5 mg  5 mg Oral TID PRN Augusto Gamble, MD   5 mg at 09/23/22 0019   Or   haloperidol lactate (HALDOL) injection 5 mg  5 mg Intramuscular TID PRN Augusto Gamble, MD       hydrOXYzine (ATARAX) tablet 25 mg  25 mg Oral TID PRN Starleen Blue, NP       lithium carbonate capsule 300 mg  300 mg Oral BID WC Augusto Gamble, MD   300 mg at 09/23/22 0750   LORazepam (ATIVAN) tablet 2 mg  2 mg Oral TID PRN Augusto Gamble, MD   2 mg at 09/23/22 0019   Or   LORazepam (ATIVAN) injection 2 mg  2 mg Intramuscular TID PRN Augusto Gamble, MD       LORazepam (ATIVAN) tablet 1 mg  1 mg Oral BID Starleen Blue, NP   1 mg at 09/23/22 0749   magnesium hydroxide (MILK OF MAGNESIA) suspension 30 mL  30 mL Oral Daily PRN Augusto Gamble, MD       SUMAtriptan (IMITREX) tablet 25 mg  25 mg Oral Q2H PRN Starleen Blue, NP   25 mg at 09/23/22 0020   traZODone (DESYREL) tablet 50 mg  50 mg Oral QHS Starleen Blue, NP   50 mg at 09/22/22 2018    Lab Results:  Results for orders placed or performed during the hospital encounter of 09/22/22 (from the past 48 hour(s))  Lithium level     Status: Abnormal   Collection Time: 09/23/22  6:47 AM  Result Value Ref Range   Lithium Lvl 0.27 (L) 0.60 - 1.20 mmol/L    Comment: Performed at [facility], [address] [place], [place] [address]  Sedimentation rate     Status: None   Collection Time: 09/23/22  6:47 AM  Result Value Ref Range   Sed Rate 8 0 - 22 mm/hr    Comment: Performed at [facility], [address], [place], [place] [address]    Blood Alcohol level:  Lab Results  Component Value Date   [facility] <10 09/21/2022   ETH <10 04/19/2022    Metabolic Disorder Labs: Lab Results  Component Value Date   HGBA1C 5.0 02/28/2022   MPG  96.8 02/28/2022   MPG 91.06  09/19/2021   No results found for: "PROLACTIN" Lab Results  Component Value Date   CHOL 249 (H) 09/21/2022   TRIG 124 09/21/2022   HDL 52 09/21/2022   CHOLHDL 4.8 09/21/2022   VLDL 25 09/21/2022   LDLCALC 172 (H) 09/21/2022   LDLCALC 113 (H) 02/28/2022    Physical Findings: AIMS:  , ,  ,  ,    CIWA:    COWS:     Musculoskeletal: Strength & Muscle Tone: within normal limits Gait & Station: normal Patient leans: N/A  Psychiatric Specialty Exam:  Presentation  General Appearance:  Casual  Eye Contact: Good  Speech: Clear and Coherent; Normal Rate  Speech Volume: Normal  Handedness: Right   Mood and Affect  Mood: Anxious; Depressed  Affect: Congruent   Thought Process  Thought Processes: Disorganized  Descriptions of Associations:Tangential  Orientation:Partial  Thought Content:Illogical  History of Schizophrenia/Schizoaffective disorder:No  Duration of Psychotic Symptoms:Less than six months  Hallucinations:Hallucinations: None  Ideas of Reference:Delusions  Suicidal Thoughts:Suicidal Thoughts: No  Homicidal Thoughts:Homicidal Thoughts: No   Sensorium  Memory: Immediate Good  Judgment: Poor  Insight: Poor   Executive Functions  Concentration: Poor  Attention Span: Fair  Recall: Fair  Fund of Knowledge: Fair  Language: Good   Psychomotor Activity  Psychomotor Activity: Psychomotor Activity: Normal   Assets  Assets: Resilience; Housing; Desire for Improvement; Communication Skills; Social Support   Sleep  Sleep: Sleep: Fair (Patient is unable to quantify the amount she has been sleeping but states that she has been receiving sleep)    Physical Exam: Physical Exam Constitutional:      Appearance: Normal appearance.  HENT:     Head: Normocephalic and atraumatic.     Nose: Nose normal.     Mouth/Throat:     Mouth: Mucous membranes are moist.  Eyes:     Extraocular Movements: Extraocular movements  intact.  Cardiovascular:     Rate and Rhythm: Tachycardia present.  Pulmonary:     Effort: Pulmonary effort is normal.  Abdominal:     General: Abdomen is flat.  Musculoskeletal:        General: Normal range of motion.     Cervical back: Normal range of motion.  Skin:    General: Skin is warm.  Neurological:     Mental Status: She is alert.  Psychiatric:        Attention and Perception: She does not perceive auditory or visual hallucinations.        Mood and Affect: Mood is anxious and depressed.        Speech: Speech normal.        Behavior: Behavior normal. Behavior is cooperative.        Thought Content: Thought content is paranoid and delusional. Thought content does not include homicidal or suicidal ideation. Thought content does not include suicidal plan.        Cognition and Memory: Memory normal. Cognition is impaired.        Judgment: Judgment is inappropriate.    Review of Systems  Constitutional: Negative.   HENT: Negative.    Eyes: Negative.   Respiratory: Negative.    Cardiovascular: Negative.   Gastrointestinal: Negative.   Skin: Negative.   Neurological: Negative.   Psychiatric/Behavioral:  Positive for depression. Negative for hallucinations, substance abuse and suicidal ideas. The patient is nervous/anxious. The patient does not have insomnia.    Blood pressure 95/61, pulse (!) 133, temperature 98 F (36.7 C),  resp. rate 18, height 5\' 6"  (1.676 m), weight 63.1 kg, last menstrual period 05/21/2022, SpO2 95 %. Body mass index is 22.47 kg/m.   Treatment Plan Summary: Daily contact with patient to assess and evaluate symptoms and progress in treatment and Medication management  Plan:  Patient presents today encounter stating that she is feeling much better today and states that her thoughts are mostly clear.  Patient states that she is currently taking her medications regularly and denies experiencing any adverse side effects.  Patient endorses some depression  and anxiety.  Patient denies suicidal or homicidal ideations.  She further denies auditory or visual hallucinations.  Patient continues to endorse paranoia stating that she feels people do things to annoy her.  Patient also expresses delusional thoughts characterized by feeling like someone or something is moving things around without her knowing.  Patient also continues to exhibit hyperreligiosity stating that God and Prudy Feeler are putting thoughts into her head.  Patient is unable to quantify the amount of sleep she receives but denies feeling tired.  If patient continues to experience psychotic symptoms, provider will consider increasing patient's Abilify dosage from 5 mg to 10 mg daily.  Patient will continue to take her medications as prescribed.  Safety and Monitoring: Voluntary admission to inpatient psychiatric unit for safety, stabilization and treatment Daily contact with patient to assess and evaluate symptoms and progress in treatment Patient's case to be discussed in multi-disciplinary team meeting Observation Level : q15 minute checks Vital signs: q12 hours Precautions: suicide, elopement, and assault   Diagnosis: Principal Problem:   Bipolar I disorder, most recent episode mixed, severe with psychotic features (HCC) Active Problems:   Migraines   Insomnia   GAD (generalized anxiety disorder)  #Bipolar 1 disorder, most recent episode mixed, severe with psychotic features -Continue Abilify 5 mg daily for psychosis and mood stabilization -Continue lithium 300 mg 2 times daily with meals for mood stabilization  #Generalized anxiety disorder -Continue hydroxyzine 25 mg 3 times daily as needed for anxiety  #Insomnia -Continue trazodone 50 mg at bedtime for insomnia  #Migraines -Continue sumatriptan 25 mg every 2 hours as needed  Agitation protocol  -Haldol 5 mg, PO or Inj, 3 times daily as needed for agitation AND  -Lorazepam 2 mg, PO or Inj, 3 times daily as needed for agitation  AND -Diphenhydramine 50 mg, PO or Inj, 3 times daily as needed for agitation  As needed medications: -Patient to continue taking Tylenol 650 mg every 6 hours as needed for mild pain -Patient to continue taking Maalox/Mylanta 30 mL every 4 hours as needed for indigestion -Patient to continue taking Milk of Magnesia 30 mL as needed for mild constipation  Discharge Planning: Social work and case management to assist with discharge planning and identification of hospital follow-up needs prior to discharge Estimated LOS: 5-7 days Discharge Concerns: Need to establish a safety plan; Medication compliance and effectiveness Discharge Goals: Return home with outpatient referrals for mental health follow-up including medication management/psychotherapy   I certify that inpatient services furnished can reasonably be expected to improve the patient's condition.    Meta Hatchet, PA 09/23/2022, 12:52 PM

## 2022-09-23 NOTE — Progress Notes (Signed)

## 2022-09-23 NOTE — Progress Notes (Signed)
Adult Psychoeducational Group Note  Date:  09/23/2022 Time:  8:27 PM  Group Topic/Focus:  Wrap-Up Group:   The focus of this group is to help patients review their daily goal of treatment and discuss progress on daily workbooks.  Participation Level:  Active  Participation Quality:  Appropriate  Affect:  Appropriate  Cognitive:  Appropriate  Insight: Appropriate  Engagement in Group:  Engaged  Modes of Intervention:  Discussion  Additional Comments:  Christine Bishop high positive for today felt good today because she slept and her visitor. Her low negative her brother death stiff has a negative for her.   Christine Bishop Long 09/23/2022, 8:27 PM

## 2022-09-23 NOTE — Progress Notes (Signed)
Administered Benadryl Ativan and Haldol for panic attack. Pt witnessed breathing hard tearful and "repeatedly saying Help me Lord"

## 2022-09-23 NOTE — [facility] Suicide Risk Assessment (Signed)
[facility] INPATIENT:  Family/Significant Other Suicide Prevention Education  Suicide Prevention Education:  Education Completed; husband Christine Bishop [phone],  (name of family member/significant other) has been identified by the patient as the family member/significant other with whom the patient will be residing, and identified as the person(s) who will aid the patient in the event of a mental health crisis (suicidal ideations/suicide attempt).    Husband states that he is not concerned about his wife being suicidal at this time because she she has not been depressed, is only here because of confusion.  He declined the information, having received it at her last admission and still having access to that information.  He did inform that his son-in-law who is currently in the home is in [employer] and has a firearm which is kept locked at all times.  With written consent from the patient, the family member/significant other has been provided the following suicide prevention education, prior to the and/or following the discharge of the patient.  The suicide prevention education provided includes the following: Suicide risk factors Suicide prevention and interventions National Suicide Hotline telephone number [facility] assessment telephone number [facility] Emergency Assistance 911 [facility] and/or Residential Mobile Crisis Unit telephone number  Request made of family/significant other to: Remove weapons (e.g., guns, rifles, knives), all items previously/currently identified as safety concern.   Remove drugs/medications (over-the-counter, prescriptions, illicit drugs), all items previously/currently identified as a safety concern.  The family member/significant other verbalizes understanding of the suicide prevention education information provided.  The family member/significant other agrees to remove the items of safety concern listed above.  Christine Bishop  Christine Bishop 09/23/2022, 5:01 PM

## 2022-09-23 NOTE — Progress Notes (Signed)
   09/23/22 2130  Psych Admission Type (Psych Patients Only)  Admission Status Involuntary  Psychosocial Assessment  Patient Complaints Anxiety  Eye Contact Fair  Facial Expression Anxious  Affect Appropriate to circumstance  Speech Logical/coherent  Interaction Assertive  Motor Activity Slow  Appearance/Hygiene Unremarkable  Behavior Characteristics Cooperative  Mood Anxious;Pleasant  Aggressive Behavior  Effect No apparent injury  Thought Process  Coherency Circumstantial  Content Religiosity  Delusions None reported or observed  Perception WDL  Hallucination None reported or observed  Judgment Poor  Confusion None  Danger to Self  Current suicidal ideation? Denies  Danger to Others  Danger to Others None reported or observed

## 2022-09-24 ENCOUNTER — Encounter (HOSPITAL_COMMUNITY): Payer: Self-pay

## 2022-09-24 MED ORDER — ARIPIPRAZOLE 10 MG PO TABS
10.0000 mg | ORAL_TABLET | Freq: Every day | ORAL | Status: DC
  Administered 2022-09-25 – 2022-09-27 (×3): 10 mg via ORAL
  Filled 2022-09-24 (×5): qty 1

## 2022-09-24 MED ORDER — MELATONIN 3 MG PO TABS
3.0000 mg | ORAL_TABLET | Freq: Every day | ORAL | Status: DC
  Administered 2022-09-24 – 2022-09-26 (×3): 3 mg via ORAL
  Filled 2022-09-24 (×5): qty 1

## 2022-09-24 MED ORDER — TRAZODONE HCL 50 MG PO TABS
50.0000 mg | ORAL_TABLET | Freq: Once | ORAL | Status: AC
  Administered 2022-09-24: 50 mg via ORAL

## 2022-09-24 MED ORDER — LITHIUM CARBONATE 300 MG PO CAPS
450.0000 mg | ORAL_CAPSULE | Freq: Two times a day (BID) | ORAL | Status: DC
  Administered 2022-09-24 – 2022-09-27 (×6): 450 mg via ORAL
  Filled 2022-09-24 (×11): qty 1

## 2022-09-24 MED ORDER — TRAZODONE HCL 150 MG PO TABS
150.0000 mg | ORAL_TABLET | Freq: Every day | ORAL | Status: DC
  Administered 2022-09-24: 150 mg via ORAL
  Filled 2022-09-24 (×3): qty 1

## 2022-09-24 NOTE — BH IP Treatment Plan (Signed)
Interdisciplinary Treatment and Diagnostic Plan Update  09/24/2022 Time of Session: 0830 Christine Bishop MRN: [id number]  Principal Diagnosis: Bipolar I disorder, most recent episode mixed, severe with psychotic features (HCC)  Secondary Diagnoses: Principal Problem:   Bipolar I disorder, most recent episode mixed, severe with psychotic features (HCC) Active Problems:   Migraines   Insomnia   GAD (generalized anxiety disorder)   Current Medications:  Current Facility-Administered Medications  Medication Dose Route Frequency Provider Last Rate Last Admin   acetaminophen (TYLENOL) tablet 650 mg  650 mg Oral Q6H PRN Augusto Gamble, MD       alum & mag hydroxide-simeth (MAALOX/MYLANTA) 200-200-20 MG/5ML suspension 30 mL  30 mL Oral Q4H PRN Augusto Gamble, MD       Melene Muller ON 09/25/2022] ARIPiprazole (ABILIFY) tablet 10 mg  10 mg Oral Daily Nkwenti, Tyler Aas, NP       diphenhydrAMINE (BENADRYL) capsule 50 mg  50 mg Oral TID PRN Augusto Gamble, MD   50 mg at 09/23/22 0019   Or   diphenhydrAMINE (BENADRYL) injection 50 mg  50 mg Intramuscular TID PRN Augusto Gamble, MD       haloperidol (HALDOL) tablet 5 mg  5 mg Oral TID PRN Augusto Gamble, MD   5 mg at 09/23/22 0019   Or   haloperidol lactate (HALDOL) injection 5 mg  5 mg Intramuscular TID PRN Augusto Gamble, MD       hydrOXYzine (ATARAX) tablet 25 mg  25 mg Oral TID PRN Starleen Blue, NP   25 mg at 09/24/22 0101   lithium carbonate capsule 450 mg  450 mg Oral BID WC Starleen Blue, NP       LORazepam (ATIVAN) tablet 2 mg  2 mg Oral TID PRN Augusto Gamble, MD   2 mg at 09/23/22 0019   Or   LORazepam (ATIVAN) injection 2 mg  2 mg Intramuscular TID PRN Augusto Gamble, MD       magnesium hydroxide (MILK OF MAGNESIA) suspension 30 mL  30 mL Oral Daily PRN Augusto Gamble, MD       melatonin tablet 3 mg  3 mg Oral QHS Nkwenti, Doris, NP       SUMAtriptan (IMITREX) tablet 25 mg  25 mg Oral Q2H PRN Starleen Blue, NP   25 mg at 09/23/22 0020   traZODone (DESYREL) tablet 150  mg  150 mg Oral QHS Nkwenti, Doris, NP       PTA Medications: Medications Prior to Admission  Medication Sig Dispense Refill Last Dose   Diclofenac Potassium,Migraine, (CAMBIA) 50 MG PACK Take 50 mg by mouth See admin instructions. Administer one packet (50 mg) of CAMBIAT  for the acute treatment of migraine. Empty the contents of one packet into a cup containing 1 to 2 ounces (30 to 60 mL) of water, mix well and drink immediately. Do not use liquids other than water.      estradiol (VIVELLE-DOT) 0.05 MG/24HR patch Place 1 patch onto the skin 2 (two) times a week.      ketorolac (ACULAR) 0.5 % ophthalmic solution INSTILL 1 DROP INTO THE RIGHT EYE 4 TIMES A DAY AS DIRECTED (Patient taking differently: Place 1 drop into the right eye 4 (four) times daily.) 5 mL 6    lamoTRIgine (LAMICTAL) 25 MG tablet Take 2 tablets (50 mg total) by mouth daily. Taper Lamictal to 50 mg daily for 3 days before discontinuing the medication 6 tablet 0    lithium 300 MG tablet Take 1 tablet (300 mg  total) by mouth 2 (two) times daily. 60 tablet 2    Multiple Vitamins-Minerals (PRESERVISION AREDS PO) Take 1 tablet by mouth at bedtime.      nystatin (MYCOSTATIN) 100000 UNIT/ML suspension Take 5 mLs (500,000 Units total) by mouth 4 (four) times daily. 200 mL 1    pregabalin (LYRICA) 50 MG capsule Take 1 capsule (50 mg total) by mouth 2 (two) times daily as needed. (Patient taking differently: Take 50 mg by mouth 2 (two) times daily as needed (For pain).) 60 capsule 2    progesterone (PROMETRIUM) 200 MG capsule Take 200 mg by mouth at bedtime.      Propylene Glycol, PF, (SYSTANE COMPLETE PF) 0.6 % SOLN Place 1 drop into both eyes in the morning, at noon, in the evening, and at bedtime.      rosuvastatin (CRESTOR) 5 MG tablet Take 5 mg by mouth daily.      sertraline (ZOLOFT) 25 MG tablet Take 1 tablet (25 mg total) by mouth daily. 90 tablet 0    traZODone (DESYREL) 50 MG tablet Take 1 tablet (50 mg total) by mouth at bedtime  as needed for sleep. 90 tablet 1     Patient Stressors:    Patient Strengths:    Treatment Modalities: Medication Management, Group therapy, Case management,  1 to 1 session with clinician, Psychoeducation, Recreational therapy.   Physician Treatment Plan for Primary Diagnosis: Bipolar I disorder, most recent episode mixed, severe with psychotic features (HCC) Long Term Goal(s): Improvement in symptoms so as ready for discharge   Short Term Goals: Ability to identify changes in lifestyle to reduce recurrence of condition will improve Ability to verbalize feelings will improve Ability to disclose and discuss suicidal ideas Ability to demonstrate self-control will improve Ability to identify and develop effective coping behaviors will improve Ability to maintain clinical measurements within normal limits will improve Compliance with prescribed medications will improve  Medication Management: Evaluate patient's response, side effects, and tolerance of medication regimen.  Therapeutic Interventions: 1 to 1 sessions, Unit Group sessions and Medication administration.  Evaluation of Outcomes: Progressing  Physician Treatment Plan for Secondary Diagnosis: Principal Problem:   Bipolar I disorder, most recent episode mixed, severe with psychotic features (HCC) Active Problems:   Migraines   Insomnia   GAD (generalized anxiety disorder)  Long Term Goal(s): Improvement in symptoms so as ready for discharge   Short Term Goals: Ability to identify changes in lifestyle to reduce recurrence of condition will improve Ability to verbalize feelings will improve Ability to disclose and discuss suicidal ideas Ability to demonstrate self-control will improve Ability to identify and develop effective coping behaviors will improve Ability to maintain clinical measurements within normal limits will improve Compliance with prescribed medications will improve     Medication Management: Evaluate  patient's response, side effects, and tolerance of medication regimen.  Therapeutic Interventions: 1 to 1 sessions, Unit Group sessions and Medication administration.  Evaluation of Outcomes: Progressing   RN Treatment Plan for Primary Diagnosis: Bipolar I disorder, most recent episode mixed, severe with psychotic features (HCC) Long Term Goal(s): Knowledge of disease and therapeutic regimen to maintain health will improve  Short Term Goals: Ability to remain free from injury will improve, Ability to verbalize frustration and anger appropriately will improve, Ability to demonstrate self-control, Ability to participate in decision making will improve, Ability to verbalize feelings will improve, Ability to disclose and discuss suicidal ideas, Ability to identify and develop effective coping behaviors will improve, and Compliance with prescribed medications  will improve  Medication Management: RN will administer medications as ordered by provider, will assess and evaluate patient's response and provide education to patient for prescribed medication. RN will report any adverse and/or side effects to prescribing provider.  Therapeutic Interventions: 1 on 1 counseling sessions, Psychoeducation, Medication administration, Evaluate responses to treatment, Monitor vital signs and CBGs as ordered, Perform/monitor CIWA, COWS, AIMS and Fall Risk screenings as ordered, Perform wound care treatments as ordered.  Evaluation of Outcomes: Progressing   LCSW Treatment Plan for Primary Diagnosis: Bipolar I disorder, most recent episode mixed, severe with psychotic features (HCC) Long Term Goal(s): Safe transition to appropriate next level of care at discharge, Engage patient in therapeutic group addressing interpersonal concerns.  Short Term Goals: Engage patient in aftercare planning with referrals and resources, Increase social support, Increase ability to appropriately verbalize feelings, Increase emotional  regulation, Facilitate acceptance of mental health diagnosis and concerns, Facilitate patient progression through stages of change regarding substance use diagnoses and concerns, Identify triggers associated with mental health/substance abuse issues, and Increase skills for wellness and recovery  Therapeutic Interventions: Assess for all discharge needs, 1 to 1 time with Social worker, Explore available resources and support systems, Assess for adequacy in community support network, Educate family and significant other(s) on suicide prevention, Complete Psychosocial Assessment, Interpersonal group therapy.  Evaluation of Outcomes: Progressing   Progress in Treatment: Attending groups: Yes. Participating in groups: Yes. Taking medication as prescribed: Yes. Toleration medication: Yes. Family/Significant other contact made: Yes, individual(s) contacted:  Isel Truglio, partner, [phone] Patient understands diagnosis: No. Discussing patient identified problems/goals with staff: Yes. Medical problems stabilized or resolved: Yes. Denies suicidal/homicidal ideation: No. Issues/concerns per patient self-inventory: Yes. Other: none  New problem(s) identified: No, Describe:  none  New Short Term/Long Term Goal(s): Patient to work towards detox, elimination of symptoms of psychosis, medication management for mood stabilization; elimination of SI thoughts; development of comprehensive mental wellness/sobriety plan.  Patient Goals:  Patient states their goal for treatment is to "make sure meds are stable."  Discharge Plan or Barriers: No psychosocial barriers identified at this time, patient to return to place of residence when appropriate for discharge.   Reason for Continuation of Hospitalization: Depression Medication stabilization Other; describe psychotic features   Estimated Length of Stay:  Last 3 [place] Suicide Severity Risk Score: Flowsheet Row Admission (Current) [facility]20[facility] from 09/21/2022 in [facility] ED to Hosp-Admission (Discharged) from 04/21/2022 in [facility]  C-SSRS RISK CATEGORY No Risk Error: Q3, 4, or 5 should not be populated when Q2 is No Error: Q7 should not be populated when Q6 is No       Last PHQ 2/9 Scores:    09/21/2022    4:36 AM 08/20/2022    3:03 PM 04/02/2022   10:44 AM  Depression screen PHQ 2/9  Decreased Interest 0 0 2  Down, Depressed, Hopeless 3 0 1  PHQ - 2 Score 3 0 3  Altered sleeping 3 1 2   Tired, decreased energy 3 0 2  Change in appetite 2 1 3   Feeling bad or failure about yourself  2 0 1  Trouble concentrating 1 0 3  Moving slowly or fidgety/restless 1 0 3  Suicidal thoughts 0 0 0  PHQ-9 Score 15 2 17   Difficult doing work/chores Very difficult      Scribe for Treatment Team: Almedia Balls 09/24/2022 3:46 PM

## 2022-09-24 NOTE — Progress Notes (Signed)
Chaplain received a referral from NP to provide grief support to Bishop.  Christine's brother died last 2022/10/24 and she is missing the funeral because of this hospitalization.  She feels that her brother would understand.  She went to see him in person after he passed to say goodbye and he looked just like her father who died when she was 47.  She feels that that is what brought on this episode.  She lost her mother when pt was 62.  Her mother-in-law is coping with Alzheimer's and she is worried about her safety.  Chaplain provided listening as well as grief support.  She has been concerned about her recent hospitalizations and wants to get to a place where she is stable.  She was able to speak about this openly and was grateful for the support.

## 2022-09-24 NOTE — Plan of Care (Signed)
  Problem: Education: Goal: Knowledge of [facility] Education information/materials will improve 09/24/2022 1750 by Melvenia Needles, RN Outcome: Progressing 09/24/2022 1246 by Melvenia Needles, RN Outcome: Progressing Goal: Emotional status will improve 09/24/2022 1750 by Melvenia Needles, RN Outcome: Progressing 09/24/2022 1246 by Melvenia Needles, RN Outcome: Progressing Goal: Mental status will improve 09/24/2022 1750 by Melvenia Needles, RN Outcome: Progressing 09/24/2022 1246 by Melvenia Needles, RN Outcome: Progressing Goal: Verbalization of understanding the information provided will improve 09/24/2022 1750 by Melvenia Needles, RN Outcome: Progressing 09/24/2022 1246 by Melvenia Needles, RN Outcome: Progressing   Problem: Activity: Goal: Interest or engagement in activities will improve 09/24/2022 1750 by Melvenia Needles, RN Outcome: Progressing 09/24/2022 1246 by Melvenia Needles, RN Outcome: Progressing Goal: Sleeping patterns will improve 09/24/2022 1750 by Melvenia Needles, RN Outcome: Progressing 09/24/2022 1246 by Melvenia Needles, RN Outcome: Progressing

## 2022-09-24 NOTE — Progress Notes (Signed)
Patient seen walking the halls. Stated that she had to leave the AA group because, "My brother just died. He was an alcoholic and it was just too much."   09/24/22 2015  Psych Admission Type (Psych Patients Only)  Admission Status Involuntary  Psychosocial Assessment  Patient Complaints Anxiety;Depression  Eye Contact Fair  Facial Expression Anxious  Affect Anxious  Speech Logical/coherent  Interaction Assertive  Motor Activity Other (Comment) (WDL)  Appearance/Hygiene Unremarkable  Behavior Characteristics Cooperative;Anxious  Mood Anxious;Pleasant  Thought Process  Coherency Circumstantial  Content WDL  Delusions None reported or observed  Perception WDL  Hallucination None reported or observed  Judgment Poor  Confusion None  Danger to Self  Current suicidal ideation? Denies  Agreement Not to Harm Self Yes  Description of Agreement Verbal  Danger to Others  Danger to Others None reported or observed

## 2022-09-24 NOTE — Progress Notes (Signed)
Arine left wrap up group. Did not attend

## 2022-09-24 NOTE — Progress Notes (Signed)
   09/24/22 0800  Psych Admission Type (Psych Patients Only)  Admission Status Involuntary  Psychosocial Assessment  Patient Complaints Anxiety  Eye Contact Fair  Facial Expression Anxious  Affect Appropriate to circumstance  Speech Logical/coherent  Interaction Assertive  Motor Activity Slow  Appearance/Hygiene Unremarkable  Behavior Characteristics Cooperative  Mood Pleasant  Aggressive Behavior  Effect No apparent injury  Thought Process  Coherency Circumstantial  Content WDL  Delusions None reported or observed  Perception WDL  Hallucination None reported or observed  Judgment Poor  Confusion None  Danger to Self  Current suicidal ideation? Denies  Self-Injurious Behavior No self-injurious ideation or behavior indicators observed or expressed   Agreement Not to Harm Self Yes  Description of Agreement Verbal agreement to safety plan  Danger to Others  Danger to Others None reported or observed

## 2022-09-24 NOTE — [facility] Group Notes (Signed)
Spirituality group facilitated by Kathleen Argue, B[facility].  Group Description: Group focused on topic of hope. Patients participated in facilitated discussion around topic, connecting with one another around experiences and definitions for hope. Group members engaged with visual explorer photos, reflecting on what hope looks like for them today. Group engaged in discussion around how their definitions of hope are present today in hospital.  Modalities: Psycho-social ed, Adlerian, Narrative, MI  Patient Progress: Macady attended group and actively engaged in group conversation and activities. Her comments demonstrated good insight.

## 2022-09-24 NOTE — Group Note (Signed)
Recreation Therapy Group Note   Group Topic:Coping Skills  Group Date: 09/24/2022 Start Time: 1014 End Time: 1048 Facilitators: Jonuel Butterfield-McCall, LRT,CTRS Location: [address] Dayroom   Goal Area(s) Addresses:  Patient will identify positive coping skills techniques. Patient will identify benefits of using coping skills post d/c.  Group Description:  Mind Map.  Patient was provided a blank template of a diagram with 32 blank boxes in a tiered system, branching from the center (similar to a bubble chart). LRT directed patients to label the middle of the diagram "Coping Skills" and consider 8 different sources in which coping skills would be needed.  Pt were directed to record those instances in which coping skills would be used (anger, fear, health, mental health, depression, loss, failure and greed) in the 2nd tier boxes closest to the center. Patients were to then come up with 3 effective coping skills to address each identified area in the remaining boxes.   Affect/Mood: Appropriate   Participation Level: Engaged   Participation Quality: Independent   Behavior: Appropriate   Speech/Thought Process: Focused   Insight: Good   Judgement: Good   Modes of Intervention: STEM Activity   Patient Response to Interventions:  Engaged   Education Outcome:  Acknowledges education   Clinical Observations/Individualized Feedback: Pt was bright and engaged during group session.  Pt defined a coping skill as "something that helps get through a situation".  Pt went on to identify prayer as her main coping skill.  Pt also added the coping skills of singing, hot baths and music for anger; talking to husband, older brother and sister in law for fear; therapy, journal and psychiatry for mental health; take medications, see therapist and use a psychiatrist for depression; sadness/crying, good memories and praying for loss; spend time with friends and try again for failure; and share, exercise,  think about it and meditation for greed.    Plan: Continue to engage patient in RT group sessions 2-3x/week.   Alichia Alridge-McCall, LRT,CTRS 09/24/2022 1:58 PM

## 2022-09-24 NOTE — Plan of Care (Signed)
  Problem: Education: Goal: Knowledge of [facility] Education information/materials will improve Outcome: Progressing Goal: Emotional status will improve Outcome: Progressing Goal: Mental status will improve Outcome: Progressing Goal: Verbalization of understanding the information provided will improve Outcome: Progressing   Problem: Activity: Goal: Interest or engagement in activities will improve Outcome: Progressing Goal: Sleeping patterns will improve Outcome: Progressing   Problem: Coping: Goal: Ability to verbalize frustrations and anger appropriately will improve Outcome: Progressing Goal: Ability to demonstrate self-control will improve Outcome: Progressing   

## 2022-09-24 NOTE — Group Note (Signed)
Occupational Therapy Group Note  Group Topic:Coping Skills  Group Date: 09/24/2022 Start Time: 1430 End Time: 1500 Facilitators: Ted Mcalpine, OT   Group Description: Group encouraged increased engagement and participation through discussion and activity focused on "Coping Ahead." Patients were split up into teams and selected a card from a stack of positive coping strategies. Patients were instructed to act out/charade the coping skill for other peers to guess and receive points for their team. Discussion followed with a focus on identifying additional positive coping strategies and patients shared how they were going to cope ahead over the weekend while continuing hospitalization stay.  Therapeutic Goal(s): Identify positive vs negative coping strategies. Identify coping skills to be used during hospitalization vs coping skills outside of hospital/at home Increase participation in therapeutic group environment and promote engagement in treatment   Participation Level: Minimal   Participation Quality: Independent   Behavior: Appropriate   Speech/Thought Process: Relevant   Affect/Mood: Appropriate   Insight: Fair   Judgement: Fair      Modes of Intervention: Education  Patient Response to Interventions:  Attentive   Plan: Continue to engage patient in OT groups 2 - 3x/week.  09/24/2022  Ted Mcalpine, OT Kerrin Champagne, OT

## 2022-09-24 NOTE — Progress Notes (Signed)
[facility] MD Progress Note  09/24/2022 4:02 PM [name]  MRN:  [id number]  Reason For Admission:   Christine Bishop. [name] is a 57 year old, Caucasian female with a past psychiatric history significant for bipolar disorder, major depressive disorder, and generalized anxiety disorder who presented to [facility] on 5/3 by way of EMS after her husband called 911 to report that she was acting bizarrely, screaming, lashing out, and crying stating: "I want God to take me to heaven right now."  Patient was involuntarily committed prior to being transferred to [facility] for treatment and stabilization of her mental status.  24-hour chart review: Sleep Hours last night: none documented, but pt reports a good sleep quality last night. Nursing Concerns: None reported/noted Behavioral episodes in the past 24 hrs: None  Medication Compliance: Compliant  Vital Signs in the past 24 hrs: WNL PRN Medications in the past 24 VQQ:VZDGLOV, Ativan, Hydroxyzine, Haldol, Benadryl.  Patient assessment note: Pt with flat affect and depressed mood, attention to personal hygiene and grooming is fair, eye contact is good, speech is clear & coherent. Thought contents are organized and logical, and pt currently denies SI/HI/AVH or paranoia. There is no evidence of delusional thoughts.    Pt reports feeling much better today, reports that her sleep last night was poor, states that she has only been getting 3-4 hrs per night for a while now. She reports that appetite is good and has improved significantly since being hospitalized. She reports some feelings of guilt related to not having a good relationship with her brother prior to his death, but states that this is because her brother did not want for her to visit him much. She states that the trigger for this hospitalization was seeing her brother shortly prior to him passing, and seeing how emaciated he was and how similar he  was to her father prior to passing. She states that this triggered thoughts of other family members who have passed away such as her mother etc.   Pt reports that she is continuing to have some ringing in her ears, and has lost hair, which she thinks is related to Lithium, but states that overall, she has been the most stable on Lithium. She states that the series of events leading to this hospitalization were the worse that she has had. We will increase Lithium to 450 mg BID for mood stabilization, and increase Trazodone to 150 mg nightly for sleep, and also add Melatonin 3 mg nightly for sleep. We will keep other medications the same. Lithium level is currently subtherapeutic at 0.27. Also increasing Abilify from 5 mg to 10 mg nightly for mood stabilization with plan to administer LAI prior to discharge. Tentative discharge date is Friday (5/10), since we are rechecking Lithium level on Thursday.   Principal Problem: Bipolar I disorder, most recent episode mixed, severe with psychotic features (HCC) Diagnosis: Principal Problem:   Bipolar I disorder, most recent episode mixed, severe with psychotic features (HCC) Active Problems:   Migraines   Insomnia   GAD (generalized anxiety disorder)  Total Time spent with patient: 20 minutes  Past Psychiatric History:  Previous Psych Diagnoses: MDD, GAD, bipolar disorder Prior inpatient treatment:  As per documentation by outpatient Psychiatrist, Dr Mercy Riding, "[facility] in May 2023 after having delusions in the setting of discontinuing Topamax. She was also hospitalized in 08/2021 after developing acute psychosis in the setting of Zonegran withdrawal. Hospitalized to [facility] in October of 2023 after  a suicide attempt via trazodone overdose. Most recently hospitalized to [facility] on 12/1 due to anxiety. She was found to be hypotensive/septic at thtat time and was transferred to [facility] long for medical management. Following stabilization patient was admitted to  [facility]."   Current/prior outpatient treatment: Dr. Everlena Cooper with [facility] on [address]. Prior rehab hx: Denies  Psychotherapy ZO:XWRUE Beverely Pace as above  History of suicide attempt: OD on Trazodone in 2023 requiring hospitalization. History of homicide or aggression: Denies  Past Medical History:  Past Medical History:  Diagnosis Date   Anxiety    Dysplastic nevus 12/24/2019   R upper back paraspinal - moderate   Dysplastic nevus 12/24/2019   R to mid upper back 3.0 cm lat to spine above braline - mild   History of migraine headaches    Posterior vitreous detachment of left eye 12/14/2019   Vitreous membranes and strands, left 04/20/2020   Vitrectomy left eye 04-27-20    Past Surgical History:  Procedure Laterality Date   CHOLECYSTECTOMY OPEN     LASIK     LEEP     Family History:  Family History  Problem Relation Age of Onset   Cancer Mother    Hypertension Father    Hyperlipidemia Father    Cancer Maternal Grandmother    Family Psychiatric  History:  Psych: Parents had MDD and GAD Psych Rx: Unsure unsure  SA/HA: Denies Substance use family hx: Alcoholism in parents  Social History:  Social History   Substance and Sexual Activity  Alcohol Use No     Social History   Substance and Sexual Activity  Drug Use No    Social History   Socioeconomic History   Marital status: Married    Spouse name: Christine Bishop   Number of children: 2   Years of education: Not on file   Highest education level: Not on file  Occupational History   Not on file  Tobacco Use   Smoking status: Former    Types: Cigarettes    Quit date: 06/07/2011    Years since quitting: 11.3    Passive exposure: Past   Smokeless tobacco: Never   Tobacco comments:    No smoking, does not need nicotine patch or gum  Vaping Use   Vaping Use: Never used  Substance and Sexual Activity   Alcohol use: No   Drug use: No   Sexual activity: Yes    Birth control/protection:  None  Other Topics Concern   Not on file  Social History Narrative   Not on file   Social Determinants of Health   Financial Resource Strain: Not on file  Food Insecurity: No Food Insecurity (09/22/2022)   Hunger Vital Sign    Worried About Running Out of Food in the Last Year: Never true    Ran Out of Food in the Last Year: Never true  Transportation Needs: No Transportation Needs (09/22/2022)   PRAPARE - Administrator, Civil Service (Medical): No    Lack of Transportation (Non-Medical): No  Physical Activity: Not on file  Stress: Not on file  Social Connections: Not on file   Additional Social History:  See H&P  Sleep: Fair  Appetite:  Good  Current Medications: Current Facility-Administered Medications  Medication Dose Route Frequency Provider Last Rate Last Admin   acetaminophen (TYLENOL) tablet 650 mg  650 mg Oral Q6H PRN Augusto Gamble, MD       alum & mag hydroxide-simeth (MAALOX/MYLANTA) 200-200-20  MG/5ML suspension 30 mL  30 mL Oral Q4H PRN Augusto Gamble, MD       [START ON 09/25/2022] ARIPiprazole (ABILIFY) tablet 10 mg  10 mg Oral Daily Ziad Maye, NP       diphenhydrAMINE (BENADRYL) capsule 50 mg  50 mg Oral TID PRN Augusto Gamble, MD   50 mg at 09/23/22 0019   Or   diphenhydrAMINE (BENADRYL) injection 50 mg  50 mg Intramuscular TID PRN Augusto Gamble, MD       haloperidol (HALDOL) tablet 5 mg  5 mg Oral TID PRN Augusto Gamble, MD   5 mg at 09/23/22 0019   Or   haloperidol lactate (HALDOL) injection 5 mg  5 mg Intramuscular TID PRN Augusto Gamble, MD       hydrOXYzine (ATARAX) tablet 25 mg  25 mg Oral TID PRN Starleen Blue, NP   25 mg at 09/24/22 0101   lithium carbonate capsule 450 mg  450 mg Oral BID WC Starleen Blue, NP       LORazepam (ATIVAN) tablet 2 mg  2 mg Oral TID PRN Augusto Gamble, MD   2 mg at 09/23/22 0019   Or   LORazepam (ATIVAN) injection 2 mg  2 mg Intramuscular TID PRN Augusto Gamble, MD       magnesium hydroxide (MILK OF MAGNESIA) suspension 30 mL  30  mL Oral Daily PRN Augusto Gamble, MD       melatonin tablet 3 mg  3 mg Oral QHS Ona Roehrs, NP       SUMAtriptan (IMITREX) tablet 25 mg  25 mg Oral Q2H PRN Starleen Blue, NP   25 mg at 09/23/22 0020   traZODone (DESYREL) tablet 150 mg  150 mg Oral QHS Starleen Blue, NP        Lab Results:  Results for orders placed or performed during the hospital encounter of 09/22/22 (from the past 48 hour(s))  Hemoglobin A1c     Status: None   Collection Time: 09/23/22  6:47 AM  Result Value Ref Range   Hgb A1c MFr Bld 4.9 4.8 - 5.6 %    Comment: (NOTE) Pre diabetes:          5.7%-6.4%  Diabetes:              >6.4%  Glycemic control for   <7.0% adults with diabetes    Mean Plasma Glucose 93.93 mg/dL    Comment: Performed at [facility], [address], [place], [place] [address]  Lithium level     Status: Abnormal   Collection Time: 09/23/22  6:47 AM  Result Value Ref Range   Lithium Lvl 0.27 (L) 0.60 - 1.20 mmol/L    Comment: Performed at [facility], [address], [place], [place] [address]  Sedimentation rate     Status: None   Collection Time: 09/23/22  6:47 AM  Result Value Ref Range   Sed Rate 8 0 - 22 mm/hr    Comment: Performed at [facility], [address], [place], [place] [address]  VITAMIN D 25 Hydroxy (Vit-D Deficiency, Fractures)     Status: None   Collection Time: 09/23/22  6:47 AM  Result Value Ref Range   Vit D, 25-Hydroxy 45.13 30 - 100 ng/mL    Comment: (NOTE) Vitamin D deficiency has been defined by the Institute of Medicine  and an Endocrine Society practice guideline as a level of serum 25-OH  vitamin D less than 20 ng/mL (1,2).  The Endocrine Society went on to  further define vitamin D insufficiency as a level between 21 and 29  ng/mL (2).  1. IOM (Institute of Medicine). 2010. Dietary reference intakes for  calcium and D. [place]: [employer]. 2. Holick MF, Binkley Dolores, Bischoff-Ferrari  HA, et al. Evaluation,  treatment, and prevention of vitamin D deficiency: an Endocrine  Society clinical practice guideline, JCEM. 2011 Jul; 96(7): 1911-30.  Performed at [facility], [address] [place], [place] [address]     Blood Alcohol level:  Lab Results  Component Value Date   [facility] <10 09/21/2022   ETH <10 04/19/2022    Metabolic Disorder Labs: Lab Results  Component Value Date   HGBA1C 4.9 09/23/2022   MPG 93.93 09/23/2022   MPG 96.8 02/28/2022   No results found for: "PROLACTIN" Lab Results  Component Value Date   CHOL 249 (H) 09/21/2022   TRIG 124 09/21/2022   HDL 52 09/21/2022   CHOLHDL 4.8 09/21/2022   VLDL 25 09/21/2022   LDLCALC 172 (H) 09/21/2022   LDLCALC 113 (H) 02/28/2022    Physical Findings: AIMS:  , ,  ,  ,    CIWA:    COWS:     Musculoskeletal: Strength & Muscle Tone: within normal limits Gait & Station: normal Patient leans: N/A  Psychiatric Specialty Exam:  Presentation  General Appearance:  Appropriate for Environment; Fairly Groomed  Eye Contact: Good  Speech: Clear and Coherent  Speech Volume: Normal  Handedness: Right   Mood and Affect  Mood: Anxious; Depressed  Affect: Congruent   Thought Process  Thought Processes: Coherent  Descriptions of Associations:Intact  Orientation:Full (Time, Place and Person)  Thought Content:Logical  History of Schizophrenia/Schizoaffective disorder:No  Duration of Psychotic Symptoms:N/A  Hallucinations:Hallucinations: None  Ideas of Reference:None  Suicidal Thoughts:Suicidal Thoughts: No  Homicidal Thoughts:Homicidal Thoughts: No   Sensorium  Memory: Immediate Good  Judgment: Fair  Insight: Fair   Art therapist  Concentration: Fair  Attention Span: Fair  Recall: [employer] of Knowledge: Fair  Language: Fair   Psychomotor Activity  Psychomotor Activity: Psychomotor Activity: Normal   Assets  Assets: Investment banker, corporate; Housing; Social Support; Resilience   Sleep  Sleep: Sleep: Poor    Physical Exam: Physical Exam Constitutional:      Appearance: Normal appearance.  HENT:     Head: Normocephalic and atraumatic.     Nose: Nose normal.     Mouth/Throat:     Mouth: Mucous membranes are moist.  Eyes:     Extraocular Movements: Extraocular movements intact.  Cardiovascular:     Rate and Rhythm: Tachycardia present.  Pulmonary:     Effort: Pulmonary effort is normal.  Abdominal:     General: Abdomen is flat.  Musculoskeletal:        General: Normal range of motion.     Cervical back: Normal range of motion.  Skin:    General: Skin is warm.  Neurological:     Mental Status: She is alert.  Psychiatric:        Attention and Perception: She does not perceive auditory or visual hallucinations.        Mood and Affect: Mood is anxious and depressed.        Speech: Speech normal.        Behavior: Behavior normal. Behavior is cooperative.        Thought Content: Thought content is paranoid and delusional. Thought content does not include homicidal or suicidal  ideation. Thought content does not include suicidal plan.        Cognition and Memory: Memory normal. Cognition is impaired.        Judgment: Judgment is inappropriate.    Review of Systems  Constitutional: Negative.   HENT: Negative.    Eyes: Negative.   Respiratory: Negative.    Cardiovascular: Negative.   Gastrointestinal: Negative.   Skin: Negative.   Neurological: Negative.   Psychiatric/Behavioral:  Positive for depression. Negative for hallucinations, memory loss, substance abuse and suicidal ideas. The patient is nervous/anxious and has insomnia.    Blood pressure 103/77, pulse 97, temperature 97.6 F (36.4 C), resp. rate 20, height 5\' 6"  (1.676 m), weight 63.1 kg, last menstrual period 05/21/2022, SpO2 99 %. Body mass index is 22.47 kg/m.   Treatment Plan Summary: Daily contact with patient to assess and evaluate  symptoms and progress in treatment and Medication management  Safety and Monitoring: Voluntary admission to inpatient psychiatric unit for safety, stabilization and treatment Daily contact with patient to assess and evaluate symptoms and progress in treatment Patient's case to be discussed in multi-disciplinary team meeting Observation Level : q15 minute checks Vital signs: q12 hours Precautions: suicide, elopement, and assault   Diagnosis: Principal Problem:   Bipolar I disorder, most recent episode mixed, severe with psychotic features (HCC) Active Problems:   Migraines   Insomnia   GAD (generalized anxiety disorder)  Medications #Bipolar 1 disorder, most recent episode mixed, severe with psychotic features -Increase Abilify from 5 mg to 10 mg daily for psychosis and mood stabilization  -Increase lithium from 300 mg to 450 mg 2 times daily with meals for mood stabilization  #Generalized anxiety disorder -Continue hydroxyzine 25 mg 3 times daily as needed for anxiety  #Insomnia -Increase trazodone from 100 mg to 150 mg at bedtime for insomnia  -Start Melatonin 3 mg nightly for sleep  #Migraines -Continue sumatriptan 25 mg every 2 hours as needed  Agitation protocol  -Haldol 5 mg, PO or Inj, 3 times daily as needed for agitation AND  -Lorazepam 2 mg, PO or Inj, 3 times daily as needed for agitation AND -Diphenhydramine 50 mg, PO or Inj, 3 times daily as needed for agitation  As needed medications: -Patient to continue taking Tylenol 650 mg every 6 hours as needed for mild pain -Patient to continue taking Maalox/Mylanta 30 mL every 4 hours as needed for indigestion -Patient to continue taking Milk of Magnesia 30 mL as needed for mild constipation  Discharge Planning: Social work and case management to assist with discharge planning and identification of hospital follow-up needs prior to discharge Estimated LOS: 5-7 days Discharge Concerns: Need to establish a safety  plan; Medication compliance and effectiveness Discharge Goals: Return home with outpatient referrals for mental health follow-up including medication management/psychotherapy   I certify that inpatient services furnished can reasonably be expected to improve the patient's condition.    Starleen Blue, NP 09/24/2022, 4:02 PMPatient ID: Christine Bishop, female   DOB: 12-Feb-1966, 57 y.o.   MRN: [id number]

## 2022-09-24 NOTE — Plan of Care (Signed)
  Problem: Education: Goal: Emotional status will improve Outcome: Progressing Goal: Mental status will improve Outcome: Progressing   Problem: Health Behavior/Discharge Planning: Goal: Compliance with treatment plan for underlying cause of condition will improve Outcome: Progressing   

## 2022-09-25 LAB — HIGH SENSITIVITY CRP

## 2022-09-25 LAB — PROLACTIN: Prolactin: 145 ng/mL — ABNORMAL HIGH (ref 3.6–25.2)

## 2022-09-25 MED ORDER — MIRTAZAPINE 15 MG PO TABS
15.0000 mg | ORAL_TABLET | Freq: Every day | ORAL | Status: DC
  Administered 2022-09-25 – 2022-09-26 (×2): 15 mg via ORAL
  Filled 2022-09-25 (×4): qty 1

## 2022-09-25 NOTE — Progress Notes (Signed)
   09/25/22 0545  15 Minute Checks  Location Bedroom  Visual Appearance Calm  Behavior Sleeping  Sleep (Behavioral Health Patients Only)  Calculate sleep? (Click Yes once per 24 hr at 0600 safety check) Yes  Documented sleep last 24 hours 9

## 2022-09-25 NOTE — Progress Notes (Addendum)
[facility] MD Progress Note  09/25/2022 3:19 PM Christine Bishop  MRN:  [id number]  Reason For Admission:   Christine Bishop is a 57 year old, Caucasian female with a past psychiatric history significant for bipolar disorder, major depressive disorder, and generalized anxiety disorder who presented to [facility] on 5/3 by way of EMS after her husband called 911 to report that she was acting bizarrely, screaming, lashing out, and crying stating: "I want God to take me to heaven right now."  Patient was involuntarily committed prior to being transferred to [facility] for treatment and stabilization of her mental status.  24-hour chart review: Sleep Hours last night: 9 hrs. Pt reports a good sleep quality last night. Nursing Concerns: None reported/noted Behavioral episodes in the past 24 hrs: None  Medication Compliance: Compliant  Vital Signs in the past 24 hrs: WNL PRN Medications in the past 24 WGN:[id number], Ativan, Hydroxyzine, Haldol, Benadryl.  Patient assessment note: Today, depressive symptoms as well as anxiety have significantly improved as per both subjective and objective assessments of patient. Her attention to personal hygiene and grooming is fair, eye contact is good, speech is clear & coherent. Thought contents are organized and logical, and pt currently denies SI/HI/AVH or paranoia. There is no evidence of delusional thoughts.   Patient reports that her sleep quality last night was poor even with the increase in the dose of the trazodone to 150 mg and addition of melatonin 3 mg. She states that Trazodone is no longer effective for her. We will discontinue Trazodone and add Remeron 15 mg for sleep/depressive symptoms. HR has been intermittently elevated since admission. We will have pt follow this up on an outpatient basis. She reports not reacting too well to inderal in the past, and states that it caused a lot of dizziness.  Tachycardia is currently asymptomatic.   We increased Lithium to 450 mg BID for mood stabilization yesterday.  We also increased Abilify from 5 mg to 10 mg nightly for mood stabilization with plan to administer LAI prior to discharge. Tentative discharge date has been moved from Friday (5/10) to 5/9 as pt is continuing to show improvements in both her depressive symptoms and anxiety. We will be rechecking Lithium level tomorrow, 5/8 at 1800.   Principal Problem: Bipolar I disorder, most recent episode mixed, severe with psychotic features (HCC) Diagnosis: Principal Problem:   Bipolar I disorder, most recent episode mixed, severe with psychotic features (HCC) Active Problems:   Migraines   Insomnia   GAD (generalized anxiety disorder)  Total Time spent with patient: 20 minutes  Past Psychiatric History:  Previous Psych Diagnoses: MDD, GAD, bipolar disorder Prior inpatient treatment:  As per documentation by outpatient Psychiatrist, Dr Mercy Riding, "[facility] in May 2023 after having delusions in the setting of discontinuing Topamax. She was also hospitalized in 08/2021 after developing acute psychosis in the setting of Zonegran withdrawal. Hospitalized to [facility] in October of 2023 after a suicide attempt via trazodone overdose. Most recently hospitalized to [facility] on 12/1 due to anxiety. She was found to be hypotensive/septic at thtat time and was transferred to [facility] long for medical management. Following stabilization patient was admitted to [facility]."   Current/prior outpatient treatment: Dr. Everlena Cooper with [facility] on [address]. Prior rehab hx: Denies  Psychotherapy MV:[facility] Pace as above  History of suicide attempt: OD on Trazodone in 2023 requiring hospitalization. History of homicide or aggression: Denies  Past Medical History:  Past Medical  History:  Diagnosis Date   Anxiety    Dysplastic nevus 12/24/2019   R upper back paraspinal - moderate   Dysplastic nevus  12/24/2019   R to mid upper back 3.0 cm lat to spine above braline - mild   History of migraine headaches    Posterior vitreous detachment of left eye 12/14/2019   Vitreous membranes and strands, left 04/20/2020   Vitrectomy left eye 04-27-20    Past Surgical History:  Procedure Laterality Date   CHOLECYSTECTOMY OPEN     LASIK     LEEP     Family History:  Family History  Problem Relation Age of Onset   Cancer Mother    Hypertension Father    Hyperlipidemia Father    Cancer Maternal Grandmother    Family Psychiatric  History:  Psych: Parents had MDD and GAD Psych Rx: Unsure unsure  SA/HA: Denies Substance use family hx: Alcoholism in parents  Social History:  Social History   Substance and Sexual Activity  Alcohol Use No     Social History   Substance and Sexual Activity  Drug Use No    Social History   Socioeconomic History   Marital status: Married    Spouse name: Christine Bishop   Number of children: 2   Years of education: Not on file   Highest education level: Not on file  Occupational History   Not on file  Tobacco Use   Smoking status: Former    Types: Cigarettes    Quit date: 06/07/2011    Years since quitting: 11.3    Passive exposure: Past   Smokeless tobacco: Never   Tobacco comments:    No smoking, does not need nicotine patch or gum  Vaping Use   Vaping Use: Never used  Substance and Sexual Activity   Alcohol use: No   Drug use: No   Sexual activity: Yes    Birth control/protection: None  Other Topics Concern   Not on file  Social History Narrative   Not on file   Social Determinants of Health   Financial Resource Strain: Not on file  Food Insecurity: No Food Insecurity (09/22/2022)   Hunger Vital Sign    Worried About Running Out of Food in the Last Year: Never true    Ran Out of Food in the Last Year: Never true  Transportation Needs: No Transportation Needs (09/22/2022)   PRAPARE - Administrator, Civil Service  (Medical): No    Lack of Transportation (Non-Medical): No  Physical Activity: Not on file  Stress: Not on file  Social Connections: Not on file   Additional Social History:  See H&P  Sleep: Fair  Appetite:  Good  Current Medications: Current Facility-Administered Medications  Medication Dose Route Frequency Provider Last Rate Last Admin   acetaminophen (TYLENOL) tablet 650 mg  650 mg Oral Q6H PRN Augusto Gamble, MD       alum & mag hydroxide-simeth (MAALOX/MYLANTA) 200-200-20 MG/5ML suspension 30 mL  30 mL Oral Q4H PRN Augusto Gamble, MD       ARIPiprazole (ABILIFY) tablet 10 mg  10 mg Oral Daily Demario Faniel, NP   10 mg at 09/25/22 0834   diphenhydrAMINE (BENADRYL) capsule 50 mg  50 mg Oral TID PRN Augusto Gamble, MD   50 mg at 09/23/22 0019   Or   diphenhydrAMINE (BENADRYL) injection 50 mg  50 mg Intramuscular TID PRN Augusto Gamble, MD       haloperidol (HALDOL) tablet 5  mg  5 mg Oral TID PRN Augusto Gamble, MD   5 mg at 09/23/22 0019   Or   haloperidol lactate (HALDOL) injection 5 mg  5 mg Intramuscular TID PRN Augusto Gamble, MD       hydrOXYzine (ATARAX) tablet 25 mg  25 mg Oral TID PRN Starleen Blue, NP   25 mg at 09/24/22 2103   lithium carbonate capsule 450 mg  450 mg Oral BID WC Starleen Blue, NP   450 mg at 09/25/22 0835   LORazepam (ATIVAN) tablet 2 mg  2 mg Oral TID PRN Augusto Gamble, MD   2 mg at 09/23/22 0019   Or   LORazepam (ATIVAN) injection 2 mg  2 mg Intramuscular TID PRN Augusto Gamble, MD       magnesium hydroxide (MILK OF MAGNESIA) suspension 30 mL  30 mL Oral Daily PRN Augusto Gamble, MD       melatonin tablet 3 mg  3 mg Oral QHS Alethia Melendrez, NP   3 mg at 09/24/22 2103   mirtazapine (REMERON) tablet 15 mg  15 mg Oral QHS Kyndal Gloster, NP       SUMAtriptan (IMITREX) tablet 25 mg  25 mg Oral Q2H PRN Starleen Blue, NP   25 mg at 09/23/22 0020    Lab Results:  No results found for this or any previous visit (from the past 48 hour(s)).   Blood Alcohol level:  Lab Results   Component Value Date   ETH <10 09/21/2022   ETH <10 04/19/2022    Metabolic Disorder Labs: Lab Results  Component Value Date   HGBA1C 4.9 09/23/2022   MPG 93.93 09/23/2022   MPG 96.8 02/28/2022   Lab Results  Component Value Date   PROLACTIN 145.0 (H) 09/23/2022   Lab Results  Component Value Date   CHOL 249 (H) 09/21/2022   TRIG 124 09/21/2022   HDL 52 09/21/2022   CHOLHDL 4.8 09/21/2022   VLDL 25 09/21/2022   LDLCALC 172 (H) 09/21/2022   LDLCALC 113 (H) 02/28/2022    Physical Findings: AIMS:  , ,  ,  ,    CIWA:    COWS:     Musculoskeletal: Strength & Muscle Tone: within normal limits Gait & Station: normal Patient leans: N/A  Psychiatric Specialty Exam:  Presentation  General Appearance:  Appropriate for Environment; Fairly Groomed  Eye Contact: Good  Speech: Clear and Coherent  Speech Volume: Normal  Handedness: Right   Mood and Affect  Mood: Euthymic  Affect: Congruent   Thought Process  Thought Processes: Coherent  Descriptions of Associations:Intact  Orientation:Full (Time, Place and Person)  Thought Content:Logical  History of Schizophrenia/Schizoaffective disorder:No  Duration of Psychotic Symptoms:N/A  Hallucinations:Hallucinations: None  Ideas of Reference:None  Suicidal Thoughts:Suicidal Thoughts: No  Homicidal Thoughts:Homicidal Thoughts: No   Sensorium  Memory: Immediate Good  Judgment: Fair  Insight: Good   Executive Functions  Concentration: Fair  Attention Span: Good  Recall: Good  Fund of Knowledge: Good  Language: Good   Psychomotor Activity  Psychomotor Activity: Psychomotor Activity: Normal   Assets  Assets: Communication Skills   Sleep  Sleep: Sleep: Fair    Physical Exam: Physical Exam Constitutional:      Appearance: Normal appearance.  HENT:     Head: Normocephalic and atraumatic.     Nose: Nose normal.     Mouth/Throat:     Mouth: Mucous membranes are  moist.  Eyes:     Extraocular Movements: Extraocular movements intact.  Cardiovascular:  Rate and Rhythm: Tachycardia present.  Pulmonary:     Effort: Pulmonary effort is normal.  Abdominal:     General: Abdomen is flat.  Musculoskeletal:        General: Normal range of motion.     Cervical back: Normal range of motion.  Skin:    General: Skin is warm.  Neurological:     Mental Status: She is alert.  Psychiatric:        Attention and Perception: She does not perceive auditory or visual hallucinations.        Mood and Affect: Mood is anxious and depressed.        Speech: Speech normal.        Behavior: Behavior normal. Behavior is cooperative.        Thought Content: Thought content is paranoid and delusional. Thought content does not include homicidal or suicidal ideation. Thought content does not include suicidal plan.        Cognition and Memory: Memory normal. Cognition is impaired.        Judgment: Judgment is inappropriate.    Review of Systems  Constitutional: Negative.   HENT: Negative.    Eyes: Negative.   Respiratory: Negative.    Cardiovascular: Negative.   Gastrointestinal: Negative.   Skin: Negative.   Neurological: Negative.   Psychiatric/Behavioral:  Positive for depression. Negative for hallucinations, memory loss, substance abuse and suicidal ideas. The patient is nervous/anxious and has insomnia.    Blood pressure 107/66, pulse (!) 129, temperature 98.4 F (36.9 C), temperature source Oral, resp. rate 20, height 5\' 6"  (1.676 m), weight 63.1 kg, last menstrual period 05/21/2022, SpO2 98 %. Body mass index is 22.47 kg/m.   Treatment Plan Summary: Daily contact with patient to assess and evaluate symptoms and progress in treatment and Medication management  Safety and Monitoring: Voluntary admission to inpatient psychiatric unit for safety, stabilization and treatment Daily contact with patient to assess and evaluate symptoms and progress in  treatment Patient's case to be discussed in multi-disciplinary team meeting Observation Level : q15 minute checks Vital signs: q12 hours Precautions: suicide, elopement, and assault   Diagnosis: Principal Problem:   Bipolar I disorder, most recent episode mixed, severe with psychotic features (HCC) Active Problems:   Migraines   Insomnia   GAD (generalized anxiety disorder)  Medications #Bipolar 1 disorder, most recent episode mixed, severe with psychotic features -Continue 10 mg daily for psychosis and mood stabilization  -Continue lithium from 300 mg to 450 mg 2 times daily with meals for mood stabilization  #Generalized anxiety disorder -Continue hydroxyzine 25 mg 3 times daily as needed for anxiety  #Insomnia -Start Remeron 15 mg for sleep -Discontinue trazodone 150 mg due to lack of effectiveness  -Continue Melatonin 3 mg nightly for sleep  #Migraines -Continue sumatriptan 25 mg every 2 hours as needed  Agitation protocol  -Haldol 5 mg, PO or Inj, 3 times daily as needed for agitation AND  -Lorazepam 2 mg, PO or Inj, 3 times daily as needed for agitation AND -Diphenhydramine 50 mg, PO or Inj, 3 times daily as needed for agitation  As needed medications: -Patient to continue taking Tylenol 650 mg every 6 hours as needed for mild pain -Patient to continue taking Maalox/Mylanta 30 mL every 4 hours as needed for indigestion -Patient to continue taking Milk of Magnesia 30 mL as needed for mild constipation  Discharge Planning: Social work and case management to assist with discharge planning and identification of hospital follow-up needs prior to  discharge Estimated LOS: 5-7 days Discharge Concerns: Need to establish a safety plan; Medication compliance and effectiveness Discharge Goals: Return home with outpatient referrals for mental health follow-up including medication management/psychotherapy   I certify that inpatient services furnished can reasonably be expected  to improve the patient's condition.    Starleen Blue, NP 09/25/2022, 3:19 PMPatient ID: Christine Bishop, female   DOB: Apr 20, 1966, 57 y.o.   MRN: [id number] Patient ID: Christine Bishop, female   DOB: 1965-11-04, 57 y.o.   MRN: [id number]

## 2022-09-25 NOTE — Progress Notes (Signed)
   09/25/22 2108  Psych Admission Type (Psych Patients Only)  Admission Status Involuntary  Psychosocial Assessment  Patient Complaints None  Eye Contact Fair  Facial Expression Anxious  Affect Anxious  Speech Logical/coherent  Interaction Assertive  Motor Activity Other (Comment) (WDL)  Appearance/Hygiene Unremarkable  Behavior Characteristics Cooperative;Appropriate to situation  Mood Pleasant;Euthymic  Thought Process  Coherency Circumstantial  Content WDL  Delusions None reported or observed  Perception WDL  Hallucination None reported or observed  Judgment Poor  Confusion None  Danger to Self  Current suicidal ideation? Denies  Agreement Not to Harm Self Yes  Description of Agreement Verbal  Danger to Others  Danger to Others None reported or observed

## 2022-09-25 NOTE — Group Note (Signed)
LCSW Group Therapy Note  Group Date: 09/25/2022 Start Time: 1100 End Time: 1150   Type of Therapy and Topic:  Group Therapy - Healthy vs Unhealthy Coping Skills Dealing with Depression   Participation Level:  Active   Description of Group The focus of this group was to determine what unhealthy coping techniques typically are used by group members and what healthy coping techniques would be helpful in coping with various problems. Patients were guided in becoming aware of the differences between healthy and unhealthy coping techniques. Patients were asked to identify 2-3 healthy coping skills they would like to learn to use more effectively.  Therapeutic Goals Patients learned that coping is what human beings do all day long to deal with various situations in their lives Patients defined and discussed healthy vs unhealthy coping techniques Patients identified their preferred coping techniques and identified whether these were healthy or unhealthy Patients determined 2-3 healthy coping skills they would like to become more familiar with and use more often. Patients provided support and ideas to each other   Summary of Patient Progress:  During group, patient expressed how she enjoys a nice hot bubble bath when she is dealing with depression. Patient proved open to input from peers and feedback from CSW. Patient demonstrated ways she could utilize other coping mechanisms when she is depressed. Patient continued to share into the subject matter, was respectful of peers, and participated throughout the entire session.   Therapeutic Modalities Cognitive Behavioral Therapy Motivational Interviewing  Beather Arbour 09/25/2022  3:16 PM

## 2022-09-25 NOTE — Group Note (Signed)
Date:  09/25/2022 Time:  10:53 AM  Group Topic/Focus:  Goals Group:   The focus of this group is to help patients establish daily goals to achieve during treatment and discuss how the patient can incorporate goal setting into their daily lives to aide in recovery. Orientation:   The focus of this group is to educate the patient on the purpose and policies of crisis stabilization and provide a format to answer questions about their admission.  The group details unit policies and expectations of patients while admitted.    Participation Level:  Active  Participation Quality:  Attentive  Affect:  Appropriate  Cognitive:  Appropriate  Insight: Good  Engagement in Group:  Engaged  Modes of Intervention:  Discussion  Additional Comments:  Patient attended group and was attentive the duration of it. Patient's goal was to attend all groups.   Christine Bishop [name] 09/25/2022, 10:53 AM

## 2022-09-25 NOTE — Group Note (Signed)
Recreation Therapy Group Note   Group Topic:Animal Assisted Therapy   Group Date: 09/25/2022 Start Time: 0945 End Time: 1035 Facilitators: Shaquetta Arcos-McCall, LRT,CTRS Location: [address] Dayroom   Animal-Assisted Activity (AAA) Program Checklist/Progress Notes Patient Eligibility Criteria Checklist & Daily Group note for Rec Tx Intervention  AAA/T Program Assumption of Risk Form signed by Patient/ or Parent Legal Guardian Yes  Patient is free of allergies or severe asthma Yes  Patient reports no fear of animals Yes  Patient reports no history of cruelty to animals Yes  Patient understands his/her participation is voluntary Yes  Patient washes hands before animal contact Yes  Patient washes hands after animal contact Yes   Affect/Mood: Appropriate   Participation Level: Engaged   Participation Quality: Independent   Behavior: Appropriate   Speech/Thought Process: Focused    Clinical Observations/Individualized Feedback:  Patient attended session and interacted appropriately with therapy dog and peers. Patient asked appropriate questions about therapy dog and his training. Patient shared stories about their pets at home with group.    Plan: Continue to engage patient in RT group sessions 2-3x/week.   Rachit Grim-McCall, LRT,CTRS 09/25/2022 2:17 PM

## 2022-09-25 NOTE — Plan of Care (Signed)
  Problem: Education: Goal: Emotional status will improve Outcome: Progressing Goal: Mental status will improve Outcome: Progressing   Problem: Activity: Goal: Interest or engagement in activities will improve Outcome: Progressing   Problem: Health Behavior/Discharge Planning: Goal: Identification of resources available to assist in meeting health care needs will improve Outcome: Progressing Goal: Compliance with treatment plan for underlying cause of condition will improve Outcome: Progressing

## 2022-09-25 NOTE — Progress Notes (Signed)
Pt remains medication compliant without adverse drug reactions. Remains tachycardic but is asymptomatic thus far. Fluids encouraged , verbally educated on orthostatic changes for safety, which she denies at this time. Visible in scheduled groups, engaged in activities on and off unit. Observed to be hyper-religious, pleasant with fair eye contact and was logical on interactions. Verbalized concerns about upcoming appointments; informed of outpatient appointment upon discharge.  Safety checks maintained at Q 15 minutes intervals without incident. Tolerated meals and fluids well. Off unit for meals and recreational activities without issues.

## 2022-09-26 ENCOUNTER — Ambulatory Visit (HOSPITAL_COMMUNITY): Payer: Federal, State, Local not specified - PPO | Admitting: Psychiatry

## 2022-09-26 LAB — LITHIUM LEVEL: Lithium Lvl: 0.69 mmol/L (ref 0.60–1.20)

## 2022-09-26 MED ORDER — TRAZODONE HCL 100 MG PO TABS
100.0000 mg | ORAL_TABLET | Freq: Every day | ORAL | Status: DC
  Administered 2022-09-26: 100 mg via ORAL
  Filled 2022-09-26 (×3): qty 1

## 2022-09-26 MED ORDER — HYDROXYZINE HCL 50 MG PO TABS: 50.0000 mg | ORAL_TABLET | Freq: Three times a day (TID) | ORAL | Status: DC | PRN

## 2022-09-26 MED ORDER — DOCUSATE SODIUM 100 MG PO CAPS
100.0000 mg | ORAL_CAPSULE | Freq: Every day | ORAL | Status: DC
  Administered 2022-09-26 – 2022-09-27 (×2): 100 mg via ORAL
  Filled 2022-09-26 (×5): qty 1

## 2022-09-26 NOTE — [facility] Group Notes (Signed)
Spiritual care group on grief and loss facilitated by Chaplain Dyanne Carrel, B[facility]  Group Goal: Support / Education around grief and loss  Members engage in facilitated group support and psycho-social education.  Group Description:  Following introductions and group rules, group members engaged in facilitated group dialogue and support around topic of loss, with particular support around experiences of loss in their lives. Group Identified types of loss (relationships / self / things) and identified patterns, circumstances, and changes that precipitate losses. Reflected on thoughts / feelings around loss, normalized grief responses, and recognized variety in grief experience. Group encouraged individual reflection on safe space and on the coping skills that they are already utilizing.  Group drew on Adlerian / Rogerian and narrative framework  Patient Progress: Christine Bishop attended group and actively engaged and participated in group conversation and activities.  She shared from her own experiences of grief and her comments demonstrated good insight.

## 2022-09-26 NOTE — [facility] Group Notes (Signed)
[facility] Group Notes:  (Nursing/MHT/Case Management/Adjunct)  Date:  09/26/2022  Time:  12:59 AM  Type of Therapy:   Wrap-up group  Participation Level:  Active  Participation Quality:  Appropriate  Affect:  Appropriate  Cognitive:  Appropriate  Insight:  Appropriate  Engagement in Group:  Engaged  Modes of Intervention:  Education  Summary of Progress/Problems: Pt goal to work on D/C, day was 10/10.  Noah Delaine 09/26/2022, 12:59 AM

## 2022-09-26 NOTE — Progress Notes (Signed)
   09/26/22 0549  15 Minute Checks  Location Bedroom  Visual Appearance Calm  Behavior Composed  Sleep (Behavioral Health Patients Only)  Calculate sleep? (Click Yes once per 24 hr at 0600 safety check) Yes  Documented sleep last 24 hours 6.25

## 2022-09-26 NOTE — Progress Notes (Addendum)
[facility] MD Progress Note  09/26/2022 2:29 PM Christine Bishop  MRN:  [id number]  Reason For Admission:   Christine Bishop is a 57 year old, Caucasian female with a past psychiatric history significant for bipolar disorder, major depressive disorder, and generalized anxiety disorder who presented to [facility] on 5/3 by way of EMS after her husband called 911 to report that she was acting bizarrely, screaming, lashing out, and crying stating: "I want God to take me to heaven right now."  Patient was involuntarily committed prior to being transferred to [facility] for treatment and stabilization of her mental status.  24-hour chart review: Sleep Hours last night: 6.25 hrs. Pt reports a fair sleep quality last night, and states that she slept only for 4-5 hrs. Nursing Concerns: None reported/noted Behavioral episodes in the past 24 hrs: None  Medication Compliance: Compliant  Vital Signs in the past 24 hrs: WNL PRN Medications in the past 24 QMV:[id number].  Patient assessment note: Pt with a significantly less depressed mood, reports feeling much better. Her  attention to personal hygiene and grooming is fair, eye contact is good, speech is clear & coherent. Thought contents are organized and logical, and pt currently denies SI/HI/AVH or paranoia. There is no evidence of delusional thoughts.    Pt continues to reports poor sleep with the discontinuation of the Trazodone yesterday and start of Remeron. We restarted the Trazodone back at 100 mg nightly for sleep due to persistent reports of trouble sleeping. We will increase Hydroxyzine to 50 mg TID PRN for anxiety as pt continues to report having anxiety, but states that she does not want any controlled medications for management of her anxiety. She also states that her heart rate has been elevated "for several years now due to anxiety." Educated to follow this up on an outpatient basis with her  PCP to r/o medical reasons for this elevation. She reports that trials of inderal in the past, caused significant dizziness and hypotension.   We increased Lithium to 450 mg BID for mood stabilization 2 days ago.  Abilify is currently at 10 mg nightly for mood stabilization. Writer discussed option of LAI prior to discharge with pt and she is not agreeable at this time due to the fact that she will still be taking Lithium PO after discharge, and prefers to take the two meds together at same times to ensure, compliance.  We will be rechecking Lithium level today, 5/8 at 1800, and will plan to discharge pt tomorrow 5/9.   Principal Problem: Bipolar I disorder, most recent episode mixed, severe with psychotic features (HCC) Diagnosis: Principal Problem:   Bipolar I disorder, most recent episode mixed, severe with psychotic features (HCC) Active Problems:   Migraines   Insomnia   GAD (generalized anxiety disorder)  Total Time spent with patient: 20 minutes  Past Psychiatric History:  Previous Psych Diagnoses: MDD, GAD, bipolar disorder Prior inpatient treatment:  As per documentation by outpatient Psychiatrist, Dr Mercy Riding, "[facility] in May 2023 after having delusions in the setting of discontinuing Topamax. She was also hospitalized in 08/2021 after developing acute psychosis in the setting of Zonegran withdrawal. Hospitalized to [facility] in October of 2023 after a suicide attempt via trazodone overdose. Most recently hospitalized to [facility] on 12/1 due to anxiety. She was found to be hypotensive/septic at thtat time and was transferred to [facility] long for medical management. Following stabilization patient was admitted to [facility]."   Current/prior outpatient  treatment: Dr. Everlena Cooper with [facility] [address]. Prior rehab hx: Denies  Psychotherapy ZO:XWRUE Beverely Pace as above  History of suicide attempt: OD on Trazodone in 2023 requiring hospitalization. History of homicide or aggression:  Denies  Past Medical History:  Past Medical History:  Diagnosis Date   Anxiety    Dysplastic nevus 12/24/2019   R upper back paraspinal - moderate   Dysplastic nevus 12/24/2019   R to mid upper back 3.0 cm lat to spine above braline - mild   History of migraine headaches    Posterior vitreous detachment of left eye 12/14/2019   Vitreous membranes and strands, left 04/20/2020   Vitrectomy left eye 04-27-20    Past Surgical History:  Procedure Laterality Date   CHOLECYSTECTOMY OPEN     LASIK     LEEP     Family History:  Family History  Problem Relation Age of Onset   Cancer Mother    Hypertension Father    Hyperlipidemia Father    Cancer Maternal Grandmother    Family Psychiatric  History:  Psych: Parents had MDD and GAD Psych Rx: Unsure unsure  SA/HA: Denies Substance use family hx: Alcoholism in parents  Social History:  Social History   Substance and Sexual Activity  Alcohol Use No     Social History   Substance and Sexual Activity  Drug Use No    Social History   Socioeconomic History   Marital status: Married    Spouse name: Mirola Chasey   Number of children: 2   Years of education: Not on file   Highest education level: Not on file  Occupational History   Not on file  Tobacco Use   Smoking status: Former    Types: Cigarettes    Quit date: 06/07/2011    Years since quitting: 11.3    Passive exposure: Past   Smokeless tobacco: Never   Tobacco comments:    No smoking, does not need nicotine patch or gum  Vaping Use   Vaping Use: Never used  Substance and Sexual Activity   Alcohol use: No   Drug use: No   Sexual activity: Yes    Birth control/protection: None  Other Topics Concern   Not on file  Social History Narrative   Not on file   Social Determinants of Health   Financial Resource Strain: Not on file  Food Insecurity: No Food Insecurity (09/22/2022)   Hunger Vital Sign    Worried About Running Out of Food in the Last Year:  Never true    Ran Out of Food in the Last Year: Never true  Transportation Needs: No Transportation Needs (09/22/2022)   PRAPARE - Administrator, Civil Service (Medical): No    Lack of Transportation (Non-Medical): No  Physical Activity: Not on file  Stress: Not on file  Social Connections: Not on file   Additional Social History:  See H&P  Sleep: Fair  Appetite:  Good  Current Medications: Current Facility-Administered Medications  Medication Dose Route Frequency Provider Last Rate Last Admin   acetaminophen (TYLENOL) tablet 650 mg  650 mg Oral Q6H PRN Augusto Gamble, MD       alum & mag hydroxide-simeth (MAALOX/MYLANTA) 200-200-20 MG/5ML suspension 30 mL  30 mL Oral Q4H PRN Augusto Gamble, MD       ARIPiprazole (ABILIFY) tablet 10 mg  10 mg Oral Daily Starleen Blue, NP   10 mg at 09/26/22 0803   diphenhydrAMINE (BENADRYL) capsule 50 mg  50 mg Oral TID PRN Augusto Gamble, MD   50 mg at 09/23/22 0019   Or   diphenhydrAMINE (BENADRYL) injection 50 mg  50 mg Intramuscular TID PRN Augusto Gamble, MD       docusate sodium (COLACE) capsule 100 mg  100 mg Oral Daily Starleen Blue, NP   100 mg at 09/26/22 1149   haloperidol (HALDOL) tablet 5 mg  5 mg Oral TID PRN Augusto Gamble, MD   5 mg at 09/23/22 0019   Or   haloperidol lactate (HALDOL) injection 5 mg  5 mg Intramuscular TID PRN Augusto Gamble, MD       hydrOXYzine (ATARAX) tablet 50 mg  50 mg Oral TID PRN Starleen Blue, NP       lithium carbonate capsule 450 mg  450 mg Oral BID WC Starleen Blue, NP   450 mg at 09/26/22 0804   LORazepam (ATIVAN) tablet 2 mg  2 mg Oral TID PRN Augusto Gamble, MD   2 mg at 09/23/22 0019   Or   LORazepam (ATIVAN) injection 2 mg  2 mg Intramuscular TID PRN Augusto Gamble, MD       magnesium hydroxide (MILK OF MAGNESIA) suspension 30 mL  30 mL Oral Daily PRN Augusto Gamble, MD       melatonin tablet 3 mg  3 mg Oral QHS Seri Kimmer, NP   3 mg at 09/25/22 2108   mirtazapine (REMERON) tablet 15 mg  15 mg Oral QHS  Starleen Blue, NP   15 mg at 09/25/22 2108   SUMAtriptan (IMITREX) tablet 25 mg  25 mg Oral Q2H PRN Starleen Blue, NP   25 mg at 09/23/22 0020   traZODone (DESYREL) tablet 100 mg  100 mg Oral QHS Starleen Blue, NP        Lab Results:  No results found for this or any previous visit (from the past 48 hour(s)).   Blood Alcohol level:  Lab Results  Component Value Date   ETH <10 09/21/2022   ETH <10 04/19/2022    Metabolic Disorder Labs: Lab Results  Component Value Date   HGBA1C 4.9 09/23/2022   MPG 93.93 09/23/2022   MPG 96.8 02/28/2022   Lab Results  Component Value Date   PROLACTIN 145.0 (H) 09/23/2022   Lab Results  Component Value Date   CHOL 249 (H) 09/21/2022   TRIG 124 09/21/2022   HDL 52 09/21/2022   CHOLHDL 4.8 09/21/2022   VLDL 25 09/21/2022   LDLCALC 172 (H) 09/21/2022   LDLCALC 113 (H) 02/28/2022    Physical Findings: AIMS: Facial and Oral Movements Muscles of Facial Expression: None, normal Lips and Perioral Area: None, normal Jaw: None, normal Tongue: None, normal,Extremity Movements Upper (arms, wrists, hands, fingers): None, normal Lower (legs, knees, ankles, toes): None, normal, Trunk Movements Neck, shoulders, hips: None, normal, Overall Severity Severity of abnormal movements (highest score from questions above): None, normal Incapacitation due to abnormal movements: None, normal Patient's awareness of abnormal movements (rate only patient's report): No Awareness, Dental Status Current problems with teeth and/or dentures?: No Does patient usually wear dentures?: No  CIWA:    COWS:     Musculoskeletal: Strength & Muscle Tone: within normal limits Gait & Station: normal Patient leans: N/A  Psychiatric Specialty Exam:  Presentation  General Appearance:  Appropriate for Environment; Fairly Groomed  Eye Contact: Good  Speech: Clear and Coherent  Speech Volume: Normal  Handedness: Right   Mood and Affect   Mood: Euthymic  Affect: Congruent  Thought Process  Thought Processes: Coherent  Descriptions of Associations:Intact  Orientation:Full (Time, Place and Person)  Thought Content:Logical  History of Schizophrenia/Schizoaffective disorder:No  Duration of Psychotic Symptoms:N/A  Hallucinations:Hallucinations: None  Ideas of Reference:None  Suicidal Thoughts:Suicidal Thoughts: No  Homicidal Thoughts:Homicidal Thoughts: No   Sensorium  Memory: Immediate Good  Judgment: Fair  Insight: Good   Executive Functions  Concentration: Fair  Attention Span: Good  Recall: Good  Fund of Knowledge: Good  Language: Good   Psychomotor Activity  Psychomotor Activity: Psychomotor Activity: Normal   Assets  Assets: Communication Skills   Sleep  Sleep: Sleep: Fair  Physical Exam: Physical Exam Constitutional:      Appearance: Normal appearance.  HENT:     Head: Normocephalic and atraumatic.     Nose: Nose normal.     Mouth/Throat:     Mouth: Mucous membranes are moist.  Eyes:     Extraocular Movements: Extraocular movements intact.  Cardiovascular:     Rate and Rhythm: Tachycardia present.  Pulmonary:     Effort: Pulmonary effort is normal.  Abdominal:     General: Abdomen is flat.  Musculoskeletal:        General: Normal range of motion.     Cervical back: Normal range of motion.  Skin:    General: Skin is warm.  Neurological:     Mental Status: She is alert.  Psychiatric:        Attention and Perception: She does not perceive auditory or visual hallucinations.        Mood and Affect: Mood is anxious and depressed.        Speech: Speech normal.        Behavior: Behavior normal. Behavior is cooperative.        Thought Content: Thought content is paranoid and delusional. Thought content does not include homicidal or suicidal ideation. Thought content does not include suicidal plan.        Cognition and Memory: Memory normal. Cognition is  impaired.        Judgment: Judgment is inappropriate.    Review of Systems  Constitutional: Negative.   HENT: Negative.    Eyes: Negative.   Respiratory: Negative.    Cardiovascular: Negative.   Gastrointestinal: Negative.   Skin: Negative.   Neurological: Negative.   Psychiatric/Behavioral:  Positive for depression. Negative for hallucinations, memory loss, substance abuse and suicidal ideas. The patient is nervous/anxious and has insomnia.    Blood pressure 106/68, pulse (!) 118, temperature 98.2 F (36.8 C), temperature source Oral, resp. rate 20, height 5\' 6"  (1.676 m), weight 63.1 kg, last menstrual period 05/21/2022, SpO2 98 %. Body mass index is 22.47 kg/m.   Treatment Plan Summary: Daily contact with patient to assess and evaluate symptoms and progress in treatment and Medication management  Safety and Monitoring: Voluntary admission to inpatient psychiatric unit for safety, stabilization and treatment Daily contact with patient to assess and evaluate symptoms and progress in treatment Patient's case to be discussed in multi-disciplinary team meeting Observation Level : q15 minute checks Vital signs: q12 hours Precautions: suicide, elopement, and assault   Diagnosis: Principal Problem:   Bipolar I disorder, most recent episode mixed, severe with psychotic features (HCC) Active Problems:   Migraines   Insomnia   GAD (generalized anxiety disorder)  Medications #Bipolar 1 disorder, most recent episode mixed, severe with psychotic features -Continue 10 mg daily for psychosis and mood stabilization  -Continue lithium from 300 mg to 450 mg 2 times daily with meals for  mood stabilization  #Generalized anxiety disorder -Increase hydroxyzine to 50 mg 3 times daily as needed for anxiety  #Insomnia -Continue Remeron 15 mg for sleep -Restart trazodone at 100 mg for insomnia -Continue Melatonin 3 mg nightly for sleep  #Migraines -Continue sumatriptan 25 mg every 2  hours as needed  Agitation protocol  -Haldol 5 mg, PO or Inj, 3 times daily as needed for agitation AND  -Lorazepam 2 mg, PO or Inj, 3 times daily as needed for agitation AND -Diphenhydramine 50 mg, PO or Inj, 3 times daily as needed for agitation  As needed medications: -Patient to continue taking Tylenol 650 mg every 6 hours as needed for mild pain -Patient to continue taking Maalox/Mylanta 30 mL every 4 hours as needed for indigestion -Patient to continue taking Milk of Magnesia 30 mL as needed for mild constipation  Discharge Planning: Social work and case management to assist with discharge planning and identification of hospital follow-up needs prior to discharge Estimated LOS: 5-7 days Discharge Concerns: Need to establish a safety plan; Medication compliance and effectiveness Discharge Goals: Return home with outpatient referrals for mental health follow-up including medication management/psychotherapy   I certify that inpatient services furnished can reasonably be expected to improve the patient's condition.   Starleen Blue, NP 09/26/2022, 2:29 PMPatient ID: Christine Bishop, female   DOB: 01/16/1966, 57 y.o.   MRN: [id number]

## 2022-09-26 NOTE — Progress Notes (Signed)
Adult Psychoeducational Group Note  Date:  09/26/2022 Time:  8:34 PM  Group Topic/Focus:  Wrap-Up Group:   The focus of this group is to help patients review their daily goal of treatment and discuss progress on daily workbooks.  Participation Level:  Active  Participation Quality:  Appropriate  Affect:  Appropriate  Cognitive:  Appropriate  Insight: Appropriate  Engagement in Group:  Engaged  Modes of Intervention:  Discussion  Additional Comments:  Alys attend wrap NA up group  Charna Busman Long 09/26/2022, 8:34 PM

## 2022-09-26 NOTE — Progress Notes (Signed)
Pt visible in hall on initial interactions. Observed to be animated with bright affect, logical, pressured speech, fair eye contact and is ambulatory with steady gait. Reports fair sleep last night "I kept getting up. I drank too much coffee yesterday like 8 cups. I also think my Trazodone is not working for me even when I was home on the 100 mg. Here is 150 and besides the coffee I can't sleep". Providers made aware of pt's concerns during progression meeting. Safety checks maintained at Q 15 minutes intervals without issues. Emotional support and reassurance offered. Pt encouraged to verbalize concerns. Pt receptive to care. Compliant with medications as ordered, denies adverse drug reactions. Attended scheduled groups, engaged in activities. Off unit for meals and courtyard; returned without issues. Pt noted to be hopeful in preparation for upcoming discharge.

## 2022-09-26 NOTE — Plan of Care (Signed)
  Problem: Education: Goal: Emotional status will improve Outcome: Progressing Goal: Mental status will improve Outcome: Progressing   Problem: Activity: Goal: Sleeping patterns will improve Outcome: Progressing   

## 2022-09-26 NOTE — Progress Notes (Signed)
   09/26/22 2105  Psych Admission Type (Psych Patients Only)  Admission Status Involuntary  Psychosocial Assessment  Patient Complaints None  Eye Contact Fair  Facial Expression Anxious  Affect Appropriate to circumstance  Speech Logical/coherent  Interaction Assertive  Motor Activity Other (Comment) (WDL)  Appearance/Hygiene Unremarkable  Behavior Characteristics Cooperative;Appropriate to situation  Mood Pleasant  Thought Process  Coherency Circumstantial  Content WDL  Delusions None reported or observed  Perception WDL  Hallucination None reported or observed  Judgment Poor  Confusion None  Danger to Self  Current suicidal ideation? Denies  Agreement Not to Harm Self Yes  Description of Agreement Verbal  Danger to Others  Danger to Others None reported or observed

## 2022-09-26 NOTE — Group Note (Signed)
Recreation Therapy Group Note   Group Topic:Team Building  Group Date: 09/26/2022 Start Time: 0934 End Time: 1010 Facilitators: Eliazer Hemphill-McCall, LRT,CTRS Location: [address] Dayroom   Goal Area(s) Addresses:  Patient will effectively work with peer towards shared goal.  Patient will identify skills used to make activity successful.  Patient will identify how skills used during activity can be used to reach post d/c goals.   Group Description: Landing Pad. In teams of 3-5, patients were given 12 plastic drinking straws and an equal length of masking tape. Using the materials provided, patients were asked to build a landing pad to catch a golf ball dropped from approximately 5 feet in the air. All materials were required to be used by the team in their design. LRT facilitated post-activity discussion.   Affect/Mood: Appropriate   Participation Level: Engaged   Participation Quality: Independent   Behavior: Appropriate   Speech/Thought Process: Focused   Insight: Good   Judgement: Good   Modes of Intervention: STEM Activity   Patient Response to Interventions:  Engaged   Education Outcome:  Acknowledges education   Clinical Observations/Individualized Feedback: Pt attended and participated in group session.    Plan: Continue to engage patient in RT group sessions 2-3x/week.   Rayven Rettig-McCall, LRT,CTRS 09/26/2022 1:25 PM

## 2022-09-27 DIAGNOSIS — F3164 Bipolar disorder, current episode mixed, severe, with psychotic features: Secondary | ICD-10-CM | POA: Diagnosis not present

## 2022-09-27 MED ORDER — ARIPIPRAZOLE 10 MG PO TABS: 10.0000 mg | ORAL_TABLET | Freq: Every day | ORAL | 0 refills | Status: DC

## 2022-09-27 MED ORDER — MELATONIN 3 MG PO TABS: 3.0000 mg | ORAL_TABLET | Freq: Every day | ORAL | 0 refills | Status: DC

## 2022-09-27 MED ORDER — MIRTAZAPINE 15 MG PO TABS: 15.0000 mg | ORAL_TABLET | Freq: Every day | ORAL | 0 refills | Status: DC

## 2022-09-27 MED ORDER — LITHIUM CARBONATE 150 MG PO CAPS: 450.0000 mg | ORAL_CAPSULE | Freq: Two times a day (BID) | ORAL | 0 refills | Status: DC

## 2022-09-27 MED ORDER — TRAZODONE HCL 100 MG PO TABS: 100.0000 mg | ORAL_TABLET | Freq: Every day | ORAL | 0 refills | Status: DC

## 2022-09-27 NOTE — Progress Notes (Signed)
Discharge education and paperwork reviewed and signed with pt. Pt alert and oriented x 4, calm and cooperative upon discharge. Picked up by pt's husband. Pt verbalized understanding of discharge instructions and discharge packet sent with pt along with pt belongings.

## 2022-09-27 NOTE — Progress Notes (Signed)
   09/27/22 0543  15 Minute Checks  Location Bedroom  Visual Appearance Calm  Behavior Composed  Sleep (Behavioral Health Patients Only)  Calculate sleep? (Click Yes once per 24 hr at 0600 safety check) Yes  Documented sleep last 24 hours 4

## 2022-09-27 NOTE — Discharge Summary (Signed)
Physician Discharge Summary Note  Patient:  Christine Bishop is an 57 y.o., female MRN:  [id number] DOB:  1966/03/31 Patient phone:  [phone] (home)  Patient address:   [address] [place] [address],  Total Time spent with patient: 30 minutes  Date of Admission:  09/22/2022 Date of Discharge: 09/27/2022  Reason for Admission:  [name] P. Harvan is a 57 year old, Caucasian female with a past psychiatric history significant for bipolar disorder, major depressive disorder, and generalized anxiety disorder who presented to [facility] on 5/3 by way of EMS after her husband called 911 to report that she was acting bizarrely, screaming, lashing out, and crying stating: "I want God to take me to heaven right now." Patient was involuntarily committed prior to being transferred to [facility] for treatment and stabilization of her mental status.   Principal Problem: Bipolar I disorder, most recent episode mixed, severe with psychotic features [facility]) Discharge Diagnoses: Principal Problem:   Bipolar I disorder, most recent episode mixed, severe with psychotic features (HCC) Active Problems:   Migraines   Insomnia   GAD (generalized anxiety disorder)   Past Psychiatric History: Please see H&P  Past Medical History:  Past Medical History:  Diagnosis Date   Anxiety    Dysplastic nevus 12/24/2019   R upper back paraspinal - moderate   Dysplastic nevus 12/24/2019   R to mid upper back 3.0 cm lat to spine above braline - mild   History of migraine headaches    Posterior vitreous detachment of left eye 12/14/2019   Vitreous membranes and strands, left 04/20/2020   Vitrectomy left eye 04-27-20    Past Surgical History:  Procedure Laterality Date   CHOLECYSTECTOMY OPEN     LASIK     LEEP     Family History:  Family History  Problem Relation Age of Onset   Cancer Mother    Hypertension Father    Hyperlipidemia Father     Cancer Maternal Grandmother    Family Psychiatric  History: Please see H&P Social History:  Social History   Substance and Sexual Activity  Alcohol Use No     Social History   Substance and Sexual Activity  Drug Use No    Social History   Socioeconomic History   Marital status: Married    Spouse name: Nashali Fix   Number of children: 2   Years of education: Not on file   Highest education level: Not on file  Occupational History   Not on file  Tobacco Use   Smoking status: Former    Types: Cigarettes    Quit date: 06/07/2011    Years since quitting: 11.3    Passive exposure: Past   Smokeless tobacco: Never   Tobacco comments:    No smoking, does not need nicotine patch or gum  Vaping Use   Vaping Use: Never used  Substance and Sexual Activity   Alcohol use: No   Drug use: No   Sexual activity: Yes    Birth control/protection: None  Other Topics Concern   Not on file  Social History Narrative   Not on file   Social Determinants of Health   Financial Resource Strain: Not on file  Food Insecurity: No Food Insecurity (09/22/2022)   Hunger Vital Sign    Worried About Running Out of Food in the Last Year: Never true    Ran Out of Food in the Last Year: Never true  Transportation Needs:  No Transportation Needs (09/22/2022)   PRAPARE - Administrator, Civil Service (Medical): No    Lack of Transportation (Non-Medical): No  Physical Activity: Not on file  Stress: Not on file  Social Connections: Not on file    Hospital Course:  During the patient's hospitalization, patient had extensive initial psychiatric evaluation, and follow-up psychiatric evaluations every day.  Psychiatric diagnoses provided upon initial assessment: Bipolar disorder, with psychotic features.  Patient's psychiatric medications were adjusted on admission: Patient's medications were titrated.  This admission.  She was placed on Abilify 10 mg a day and lithium 450 mg twice a  day.  Remeron was added at night for sleep.  During the hospitalization, other adjustments were made to the patient's psychiatric medication regimen: Lithium level was obtained prior to discharge.  It was within therapeutic range at 0.69.  Patient's care was discussed during the interdisciplinary team meeting every day during the hospitalization.  The patient denied having side effects to prescribed psychiatric medication.  Gradually, patient started adjusting to milieu. The patient was evaluated each day by a clinical provider to ascertain response to treatment. Improvement was noted by the patient's report of decreasing symptoms, improved sleep and appetite, affect, medication tolerance, behavior, and participation in unit programming.  Patient was asked each day to complete a self inventory noting mood, mental status, pain, new symptoms, anxiety and concerns.    Symptoms were reported as significantly decreased or resolved completely by discharge.   On day of discharge, the patient reports that their mood is stable. The patient denied having suicidal thoughts for more than 48 hours prior to discharge.  Patient denies having homicidal thoughts.  Patient denies having auditory hallucinations.  Patient denies any visual hallucinations or other symptoms of psychosis. The patient was motivated to continue taking medication with a goal of continued improvement in mental health.   The patient reports their target psychiatric symptoms of agitation and psychosis responded well to the psychiatric medications, and the patient reports overall benefit other psychiatric hospitalization. Supportive psychotherapy was provided to the patient. The patient also participated in regular group therapy while hospitalized. Coping skills, problem solving as well as relaxation therapies were also part of the unit programming.  Labs were reviewed with the patient, and abnormal results were discussed with the patient.  The  patient is able to verbalize their individual safety plan to this provider.  # It is recommended to the patient to continue psychiatric medications as prescribed, after discharge from the hospital.    # It is recommended to the patient to follow up with your outpatient psychiatric provider and PCP.  # It was discussed with the patient, the impact of alcohol, drugs, tobacco have been there overall psychiatric and medical wellbeing, and total abstinence from substance use was recommended the patient.ed.  # Prescriptions provided or sent directly to preferred pharmacy at discharge. Patient agreeable to plan. Given opportunity to ask questions. Appears to feel comfortable with discharge.    # In the event of worsening symptoms, the patient is instructed to call the crisis hotline, 911 and or go to the nearest ED for appropriate evaluation and treatment of symptoms. To follow-up with primary care provider for other medical issues, concerns and or health care needs  # Patient was discharged home to family with a plan to follow up as noted below.   Physical Findings: AIMS: Facial and Oral Movements Muscles of Facial Expression: None, normal Lips and Perioral Area: None, normal Jaw: None, normal  Tongue: None, normal,Extremity Movements Upper (arms, wrists, hands, fingers): None, normal Lower (legs, knees, ankles, toes): None, normal, Trunk Movements Neck, shoulders, hips: None, normal, Overall Severity Severity of abnormal movements (highest score from questions above): None, normal Incapacitation due to abnormal movements: None, normal Patient's awareness of abnormal movements (rate only patient's report): No Awareness, Dental Status Current problems with teeth and/or dentures?: No Does patient usually wear dentures?: No  CIWA:    COWS:     Musculoskeletal: Strength & Muscle Tone: within normal limits Gait & Station: normal Patient leans: N/A   Psychiatric Specialty  Exam:  Presentation  General Appearance:  Appropriate for Environment  Eye Contact: Fair  Speech: Clear and Coherent  Speech Volume: Normal  Handedness: Right   Mood and Affect  Mood: Euthymic  Affect: Appropriate   Thought Process  Thought Processes: Coherent  Descriptions of Associations:Intact  Orientation:Full (Time, Place and Person)  Thought Content:Logical  History of Schizophrenia/Schizoaffective disorder:No  Duration of Psychotic Symptoms:N/A  Hallucinations:Hallucinations: None  Ideas of Reference:None  Suicidal Thoughts:Suicidal Thoughts: No  Homicidal Thoughts:Homicidal Thoughts: No   Sensorium  Memory: Immediate Fair; Remote Fair; Recent Fair  Judgment: Fair  Insight: Fair   Art therapist  Concentration: Fair  Attention Span: Fair  Recall: [employer] of Knowledge: Fair  Language: Fair   Psychomotor Activity  Psychomotor Activity: Psychomotor Activity: Normal   Assets  Assets: Communication Skills; Desire for Improvement; Housing   Sleep  Sleep: Sleep: Good    Physical Exam: Physical Exam Constitutional:      Appearance: Normal appearance.  Neurological:     Mental Status: She is alert.  Psychiatric:        Mood and Affect: Mood normal.        Behavior: Behavior normal.        Thought Content: Thought content normal.        Judgment: Judgment normal.    Review of Systems  Psychiatric/Behavioral: Negative.    All other systems reviewed and are negative.  Blood pressure 109/73, pulse (!) 111, temperature 98.2 F (36.8 C), temperature source Oral, resp. rate 20, height 5\' 6"  (1.676 m), weight 63.1 kg, last menstrual period 05/21/2022, SpO2 100 %. Body mass index is 22.47 kg/m.   Social History   Tobacco Use  Smoking Status Former   Types: Cigarettes   Quit date: 06/07/2011   Years since quitting: 11.3   Passive exposure: Past  Smokeless Tobacco Never  Tobacco Comments   No  smoking, does not need nicotine patch or gum   Tobacco Cessation:  N/A, patient does not currently use tobacco products   Blood Alcohol level:  Lab Results  Component Value Date   ETH <10 09/21/2022   ETH <10 04/19/2022    Metabolic Disorder Labs:  Lab Results  Component Value Date   HGBA1C 4.9 09/23/2022   MPG 93.93 09/23/2022   MPG 96.8 02/28/2022   Lab Results  Component Value Date   PROLACTIN 145.0 (H) 09/23/2022   Lab Results  Component Value Date   CHOL 249 (H) 09/21/2022   TRIG 124 09/21/2022   HDL 52 09/21/2022   CHOLHDL 4.8 09/21/2022   VLDL 25 09/21/2022   LDLCALC 172 (H) 09/21/2022   LDLCALC 113 (H) 02/28/2022    See Psychiatric Specialty Exam and Suicide Risk Assessment completed by Attending Physician prior to discharge.  Discharge destination:  Home  Is patient on multiple antipsychotic therapies at discharge:  No   Has Patient had three or more  failed trials of antipsychotic monotherapy by history:  No  Recommended Plan for Multiple Antipsychotic Therapies: NA  Discharge Instructions     Diet - low sodium heart healthy   Complete by: As directed    Increase activity slowly   Complete by: As directed       Allergies as of 09/27/2022       Reactions   Inderal [propranolol] Other (See Comments)   Dizziness, syncopal episodes and "makes me manic"   Septra [sulfamethoxazole-trimethoprim] Nausea And Vomiting   Zonegran [zonisamide] Other (See Comments)   Bipolar/ Mania symptoms    Topamax [topiramate] Anxiety, Palpitations, Other (See Comments)   Panic attacks, Bi-Polar symptoms        Medication List     STOP taking these medications    estradiol 0.05 MG/24HR patch Commonly known as: VIVELLE-DOT   ketorolac 0.5 % ophthalmic solution Commonly known as: ACULAR   lamoTRIgine 25 MG tablet Commonly known as: LaMICtal   lithium 300 MG tablet Replaced by: lithium carbonate 150 MG capsule   nystatin 100000 UNIT/ML  suspension Commonly known as: MYCOSTATIN   pregabalin 50 MG capsule Commonly known as: LYRICA   PRESERVISION AREDS PO   progesterone 200 MG capsule Commonly known as: PROMETRIUM   sertraline 25 MG tablet Commonly known as: Zoloft   Systane Complete PF 0.6 % Soln Generic drug: Propylene Glycol (PF)       TAKE these medications      Indication  ARIPiprazole 10 MG tablet Commonly known as: ABILIFY Take 1 tablet (10 mg total) by mouth daily. Start taking on: Sep 28, 2022  Indication: MIXED BIPOLAR AFFECTIVE DISORDER   Cambia 50 MG Pack Generic drug: Diclofenac Potassium(Migraine) Take 50 mg by mouth See admin instructions. Administer one packet (50 mg) of CAMBIAT  for the acute treatment of migraine. Empty the contents of one packet into a cup containing 1 to 2 ounces (30 to 60 mL) of water, mix well and drink immediately. Do not use liquids other than water.  Indication: Migraine Headache   lithium carbonate 150 MG capsule Take 3 capsules (450 mg total) by mouth 2 (two) times daily with a meal. Replaces: lithium 300 MG tablet  Indication: Manic-Depression   melatonin 3 MG Tabs tablet Take 1 tablet (3 mg total) by mouth at bedtime.  Indication: Trouble Sleeping   mirtazapine 15 MG tablet Commonly known as: REMERON Take 1 tablet (15 mg total) by mouth at bedtime.  Indication: sleep/depressive symptoms   rosuvastatin 5 MG tablet Commonly known as: CRESTOR Take 5 mg by mouth daily.  Indication: High Amount of Fats in the Blood   traZODone 100 MG tablet Commonly known as: DESYREL Take 1 tablet (100 mg total) by mouth at bedtime. What changed:  medication strength how much to take when to take this reasons to take this  Indication: Trouble Sleeping        Follow-up Information     [facility]. Go to.   Specialty: Behavioral Health Why: You have a hospital follow up appointment with Dr. Mercy Riding at [facility] on May 15th  with Dr. Mercy Riding. Contact information: [address] [place] [address] [phone]        [facility] [employer] [facility]. Schedule an appointment as soon as possible for a visit.   Why: You have a hospital follow up appointment on Monday, Oct 01, 2022 at 10am with [facility]. Contact information: [address] [place], [place] [address] PHONE: [phone]  [facility]. Call.   Specialty: Hospice and Palliative Medicine Why: Please call to get connected to free group or individual grief counseling. Contact information: [address] [place] [address] [phone]                Follow-up recommendations:  Activity:  As tolerated Diet:  Heart healthy diet  Comments: The patient was reevaluated prior to discharge.  She is alert oriented and cooperative and feels great.  She reports that she is going home tomorrow husband daughter and son-in-law who all live together.  Family is quite supportive.  She has a safety plan and goes to outpatient program.  Prognosis is fair to guarded.  Signed: Rex Kras, MD 09/27/2022, 10:55 AM Total Time Spent in Direct Patient Care:  I personally spent 30 minutes on the unit in direct patient care. The direct patient care time included face-to-face time with the patient, reviewing the patient's chart, communicating with other professionals, and coordinating care. Greater than 50% of this time was spent in counseling or coordinating care with the patient regarding goals of hospitalization, psycho-education, and discharge planning needs.   Rulon Eisenmenger [facility] Psychiatrist

## 2022-09-27 NOTE — Progress Notes (Signed)
   09/27/22 0810  Psych Admission Type (Psych Patients Only)  Admission Status Involuntary  Psychosocial Assessment  Patient Complaints None  Eye Contact Fair  Facial Expression Animated  Affect Appropriate to circumstance  Speech Logical/coherent  Interaction Assertive  Motor Activity Other (Comment) (WDL)  Appearance/Hygiene Unremarkable  Behavior Characteristics Cooperative;Appropriate to situation  Mood Pleasant  Thought Process  Coherency WDL  Content WDL  Delusions None reported or observed  Perception WDL  Hallucination None reported or observed  Judgment Limited  Confusion None  Danger to Self  Current suicidal ideation? Denies  Agreement Not to Harm Self Yes  Description of Agreement verbal  Danger to Others  Danger to Others None reported or observed   Pt pleasant and med compliant. Denies SI/HI/AVH, reports being ready for discharge with plan for husband to pick her up and take her home, plan to report back to work upon discharge.

## 2022-09-27 NOTE — Group Note (Signed)
Date:  09/27/2022 Time:  11:19 AM  Group Topic/Focus:  Goals Group:   The focus of this group is to help patients establish daily goals to achieve during treatment and discuss how the patient can incorporate goal setting into their daily lives to aide in recovery.    Participation Level:  Active  Participation Quality:  Appropriate  Affect:  Appropriate  Cognitive:  Appropriate  Insight: Appropriate  Engagement in Group:  Engaged  Modes of Intervention:  Discussion  Additional Comments:     Reymundo Poll 09/27/2022, 11:19 AM

## 2022-09-27 NOTE — BHH Suicide Risk Assessment (Signed)
[facility] Discharge Suicide Risk Assessment   Principal Problem: Bipolar I disorder, most recent episode mixed, severe with psychotic features [facility]) Discharge Diagnoses: Principal Problem:   Bipolar I disorder, most recent episode mixed, severe with psychotic features (HCC) Active Problems:   Migraines   Insomnia   GAD (generalized anxiety disorder)   Total Time spent with patient: 30 minutes  Musculoskeletal: Strength & Muscle Tone: within normal limits Gait & Station: normal Patient leans: N/A  Psychiatric Specialty Exam  Presentation  General Appearance:  Appropriate for Environment; Fairly Groomed  Eye Contact: Good  Speech: Clear and Coherent  Speech Volume: Normal  Handedness: Right   Mood and Affect  Mood: Euthymic  Duration of Depression Symptoms: Greater than two weeks  Affect: Congruent   Thought Process  Thought Processes: Coherent  Descriptions of Associations:Intact  Orientation:Full (Time, Place and Person)  Thought Content:Logical  History of Schizophrenia/Schizoaffective disorder:No  Duration of Psychotic Symptoms:N/A  Hallucinations:No data recorded Ideas of Reference:None  Suicidal Thoughts:No data recorded Homicidal Thoughts:No data recorded  Sensorium  Memory: Immediate Good  Judgment: Fair  Insight: Good   Executive Functions  Concentration: Fair  Attention Span: Good  Recall: Good  Fund of Knowledge: Good  Language: Good   Psychomotor Activity  Psychomotor Activity:No data recorded  Assets  Assets: Communication Skills   Sleep  Sleep:No data recorded  Physical Exam: Physical Exam Constitutional:      Appearance: Normal appearance.  Neurological:     Mental Status: She is alert.  Psychiatric:        Mood and Affect: Mood normal.        Behavior: Behavior normal.        Thought Content: Thought content normal.    Review of Systems  Psychiatric/Behavioral: Negative.    All other  systems reviewed and are negative.  Blood pressure 109/73, pulse (!) 111, temperature 98.2 F (36.8 C), temperature source Oral, resp. rate 20, height 5\' 6"  (1.676 m), weight 63.1 kg, last menstrual period 05/21/2022, SpO2 100 %. Body mass index is 22.47 kg/m.  Mental Status Per Nursing Assessment::   On Admission:  NA  Demographic Factors:  Caucasian  Loss Factors: NA  Historical Factors: NA  Risk Reduction Factors:   Living with another person, especially a relative, Positive social support, and Positive therapeutic relationship  Continued Clinical Symptoms:  Depression:   Impulsivity  Cognitive Features That Contribute To Risk:  None    Suicide Risk:  Mild:  Suicidal ideation of limited frequency, intensity, duration, and specificity.  There are no identifiable plans, no associated intent, mild dysphoria and related symptoms, good self-control (both objective and subjective assessment), few other risk factors, and identifiable protective factors, including available and accessible social support.   Follow-up Information     [facility]. Go to.   Specialty: Behavioral Health Why: You have a hospital follow up appointment with Dr. Mercy Riding at [facility] on May 15th with Dr. Mercy Riding. Contact information: [address] [place] [address] [phone]        [facility] [employer]. Schedule an appointment as soon as possible for a visit.   Why: You have a hospital follow up appointment on Monday, Oct 01, 2022 at 10am with [facility]. Contact information: [address] [place], [place] [address] PHONE: [phone]        [facility]. Call.   Specialty: Hospice and Palliative Medicine Why: Please call to get connected to free group or individual grief counseling. Contact  information: [address] [place] [address] [phone]                Plan Of Care/Follow-up  recommendations:  Activity:  As tolerated  Rex Kras, MD 09/27/2022, 10:47 AM

## 2022-09-27 NOTE — Progress Notes (Signed)
  [facility] Adult Case Management Discharge Plan :  Will you be returning to the same living situation after discharge:  Yes,  home with family At discharge, do you have transportation home?: Yes,  husband will be picking patient up Do you have the ability to pay for your medications: Yes,  insurance  Release of information consent forms completed and in the chart;  Patient's signature needed at discharge.  Patient to Follow up at:  Follow-up Information     [facility]. Go to.   Specialty: Behavioral Health Why: You have a hospital follow up appointment with Dr. Mercy Riding at [facility] on May 15th with Dr. Mercy Riding. Contact information: [address] [place] [address] [phone]        [facility] [employer] [facility]. Schedule an appointment as soon as possible for a visit.   Why: You have a hospital follow up appointment on Monday, Oct 01, 2022 at 10am with [facility]. Contact information: [address] [place], [place] [address] PHONE: [phone]        [facility]. Call.   Specialty: Hospice and Palliative Medicine Why: Please call to get connected to free group or individual grief counseling. Contact information: [address] [facility] [place] [address] [phone]                Next level of care provider has access to [facility] Link:yes  Safety Planning and Suicide Prevention discussed: Yes,  husband     Has patient been referred to the Quitline?: Patient does not use tobacco/nicotine products  Patient has been referred for addiction treatment: No known substance use disorder.  Christine Bishop Christine Krisandra Bueno, LCSW 09/27/2022, 9:28 AM

## 2022-10-01 DIAGNOSIS — F4323 Adjustment disorder with mixed anxiety and depressed mood: Secondary | ICD-10-CM | POA: Diagnosis not present

## 2022-10-03 ENCOUNTER — Ambulatory Visit (HOSPITAL_COMMUNITY): Payer: Federal, State, Local not specified - PPO | Admitting: Psychiatry

## 2022-10-03 ENCOUNTER — Encounter (HOSPITAL_COMMUNITY): Payer: Self-pay | Admitting: Psychiatry

## 2022-10-03 VITALS — BP 115/79 | HR 89 | Ht 66.0 in | Wt 152.0 lb

## 2022-10-03 DIAGNOSIS — F310 Bipolar disorder, current episode hypomanic: Secondary | ICD-10-CM

## 2022-10-03 DIAGNOSIS — F411 Generalized anxiety disorder: Secondary | ICD-10-CM

## 2022-10-03 MED ORDER — MIRTAZAPINE 7.5 MG PO TABS: 7.5000 mg | ORAL_TABLET | Freq: Every day | ORAL | 1 refills | Status: DC

## 2022-10-03 MED ORDER — LITHIUM CARBONATE 150 MG PO CAPS
450.0000 mg | ORAL_CAPSULE | Freq: Two times a day (BID) | ORAL | 2 refills | Status: DC
Start: 2022-10-03 — End: 2022-11-16

## 2022-10-03 MED ORDER — ARIPIPRAZOLE 10 MG PO TABS
10.0000 mg | ORAL_TABLET | Freq: Every day | ORAL | 0 refills | Status: DC
Start: 2022-10-03 — End: 2022-11-27

## 2022-10-03 MED ORDER — TRAZODONE HCL 100 MG PO TABS
100.0000 mg | ORAL_TABLET | Freq: Every day | ORAL | 0 refills | Status: DC
Start: 2022-10-03 — End: 2022-11-27
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
## 2022-10-06 ENCOUNTER — Other Ambulatory Visit (HOSPITAL_COMMUNITY): Payer: Self-pay | Admitting: Psychiatry

## 2022-10-06 DIAGNOSIS — F411 Generalized anxiety disorder: Secondary | ICD-10-CM

## 2022-10-06 DIAGNOSIS — F321 Major depressive disorder, single episode, moderate: Secondary | ICD-10-CM

## 2022-10-17 ENCOUNTER — Telehealth (HOSPITAL_COMMUNITY): Payer: Self-pay | Admitting: *Deleted

## 2022-10-17 NOTE — Telephone Encounter (Signed)
Writer LVM for pt regarding forms received yesterday from [employer] (Leave and Absence Management) requesting more information as to the nature of leave or disability requested. Pt asked to return writer's call at [phone].

## 2022-10-22 NOTE — Telephone Encounter (Signed)
Add: pt is unaware as to why these forms were sent to this office. Pt is currently working and means to continue to do so.

## 2022-10-24 ENCOUNTER — Other Ambulatory Visit: Payer: Self-pay

## 2022-10-24 ENCOUNTER — Encounter (HOSPITAL_COMMUNITY): Payer: Self-pay | Admitting: Psychiatry

## 2022-10-24 ENCOUNTER — Ambulatory Visit (HOSPITAL_COMMUNITY): Payer: Federal, State, Local not specified - PPO | Admitting: Psychiatry

## 2022-10-24 DIAGNOSIS — F411 Generalized anxiety disorder: Secondary | ICD-10-CM

## 2022-10-24 DIAGNOSIS — F310 Bipolar disorder, current episode hypomanic: Secondary | ICD-10-CM | POA: Diagnosis not present

## 2022-10-24 MED ORDER — MIRTAZAPINE 7.5 MG PO TABS
7.5000 mg | ORAL_TABLET | Freq: Every day | ORAL | 1 refills | Status: DC
Start: 2022-10-24 — End: 2022-11-27
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
## 2022-10-31 ENCOUNTER — Encounter: Payer: Self-pay | Admitting: Nurse Practitioner

## 2022-10-31 ENCOUNTER — Ambulatory Visit: Payer: Federal, State, Local not specified - PPO | Admitting: Nurse Practitioner

## 2022-10-31 ENCOUNTER — Telehealth (HOSPITAL_COMMUNITY): Payer: Self-pay | Admitting: *Deleted

## 2022-10-31 VITALS — BP 103/67 | HR 82 | Ht 66.0 in | Wt 152.4 lb

## 2022-10-31 DIAGNOSIS — F321 Major depressive disorder, single episode, moderate: Secondary | ICD-10-CM

## 2022-10-31 DIAGNOSIS — R7989 Other specified abnormal findings of blood chemistry: Secondary | ICD-10-CM | POA: Diagnosis not present

## 2022-10-31 DIAGNOSIS — E041 Nontoxic single thyroid nodule: Secondary | ICD-10-CM

## 2022-10-31 DIAGNOSIS — F411 Generalized anxiety disorder: Secondary | ICD-10-CM

## 2022-10-31 DIAGNOSIS — F4323 Adjustment disorder with mixed anxiety and depressed mood: Secondary | ICD-10-CM | POA: Diagnosis not present

## 2022-10-31 NOTE — Telephone Encounter (Signed)
Writer spoke with pt to advise that FMLA forms have ben completed and singed and faxed to [employer] @ [phone]. Pt verbalizes understanding.

## 2022-10-31 NOTE — Progress Notes (Signed)
Established patient visit   Patient: Christine Bishop   DOB: [birth year]   57 y.o. Female  MRN: [id number] Visit Date: 10/31/2022   Chief Complaint  Patient presents with   Hospitalization Follow-up   Subjective    HPI  Hospital follow up  -admitted from 09/22/2022 through 09/27/2022 at [facility]al [facility]  --bipolar 1 disorder with severe psychotic features.  --started abilify 10 mg daily --started lithium 450 mg twice daily --remeron added for sleep -During her hospitalization, her prolactin levels were elevated. --Suggested further evaluation per primary care. -Patient states she is feeling well overall.  No new concerns or complaints. -She denies chest pain, chest pressure, or shortness of breath. She denies headaches or visual disturbances. She She denies abdominal pain, nausea, vomiting, or changes in bowel or bladder habits.     Medications: Outpatient Medications Prior to Visit  Medication Sig   ARIPiprazole (ABILIFY) 10 MG tablet Take 1 tablet (10 mg total) by mouth daily.   melatonin 3 MG TABS tablet Take 1 tablet (3 mg total) by mouth at bedtime.   mirtazapine (REMERON) 7.5 MG tablet Take 1 tablet (7.5 mg total) by mouth at bedtime.   rosuvastatin (CRESTOR) 5 MG tablet Take 5 mg by mouth daily.   traZODone (DESYREL) 100 MG tablet Take 1 tablet (100 mg total) by mouth at bedtime.   [DISCONTINUED] lithium carbonate 150 MG capsule Take 3 capsules (450 mg total) by mouth 2 (two) times daily with a meal.   Diclofenac Potassium,Migraine, (CAMBIA) 50 MG PACK Take 50 mg by mouth See admin instructions. Administer one packet (50 mg) of CAMBIAT  for the acute treatment of migraine. Empty the contents of one packet into a cup containing 1 to 2 ounces (30 to 60 mL) of water, mix well and drink immediately. Do not use liquids other than water. (Patient not taking: Reported on 10/24/2022)   No facility-administered medications prior to visit.    Review of Systems See HPI    Last  CBC Lab Results  Component Value Date   WBC 7.5 09/21/2022   HGB 12.9 09/21/2022   HCT 38.7 09/21/2022   MCV 95.6 09/21/2022   MCH 31.9 09/21/2022   RDW 12.7 09/21/2022   PLT 215 09/21/2022   Last metabolic panel Lab Results  Component Value Date   GLUCOSE 90 09/21/2022   NA 142 09/21/2022   K 3.6 09/21/2022   CL 107 09/21/2022   CO2 22 09/21/2022   BUN 10 09/21/2022   CREATININE 0.86 09/21/2022   GFRNONAA >60 09/21/2022   CALCIUM 9.5 09/21/2022   PROT 7.0 09/21/2022   ALBUMIN 4.0 09/21/2022   LABGLOB 2.5 06/22/2021   AGRATIO 1.6 06/22/2021   BILITOT 0.7 09/21/2022   ALKPHOS 66 09/21/2022   AST 20 09/21/2022   ALT 12 09/21/2022   ANIONGAP 13 09/21/2022   Last lipids Lab Results  Component Value Date   CHOL 249 (H) 09/21/2022   HDL 52 09/21/2022   LDLCALC 172 (H) 09/21/2022   TRIG 124 09/21/2022   CHOLHDL 4.8 09/21/2022   Last hemoglobin A1c Lab Results  Component Value Date   HGBA1C 4.9 09/23/2022   Last thyroid functions Lab Results  Component Value Date   TSH 2.128 09/21/2022   Last vitamin D Lab Results  Component Value Date   VD25OH 45.13 09/23/2022      Objective     Today's Vitals   10/31/22 1050  BP: 103/67  Pulse: 82  SpO2: 99%  Weight: 152 lb 6.4  oz (69.1 kg)  Height: 5\' 6"  (1.676 m)   Body mass index is 24.6 kg/m.  BP Readings from Last 3 Encounters:  10/31/22 103/67  08/20/22 100/65  04/25/22 104/73    Wt Readings from Last 3 Encounters:  10/31/22 152 lb 6.4 oz (69.1 kg)  08/20/22 140 lb 4.8 oz (63.6 kg)  04/21/22 144 lb 2.9 oz (65.4 kg)    Physical Exam Vitals and nursing note reviewed.  Constitutional:      Appearance: Normal appearance. She is well-developed.  HENT:     Head: Normocephalic and atraumatic.     Nose: Nose normal.     Mouth/Throat:     Mouth: Mucous membranes are moist.     Pharynx: Oropharynx is clear.  Eyes:     Extraocular Movements: Extraocular movements intact.     Conjunctiva/sclera:  Conjunctivae normal.     Pupils: Pupils are equal, round, and reactive to light.  Neck:     Vascular: No carotid bruit.     Comments: Slightly enlarged thyroid  Cardiovascular:     Rate and Rhythm: Normal rate and regular rhythm.     Pulses: Normal pulses.     Heart sounds: Normal heart sounds.  Pulmonary:     Effort: Pulmonary effort is normal.     Breath sounds: Normal breath sounds.  Abdominal:     Palpations: Abdomen is soft.  Musculoskeletal:        General: Normal range of motion.     Cervical back: Normal range of motion and neck supple.  Lymphadenopathy:     Cervical: No cervical adenopathy.  Skin:    General: Skin is warm and dry.     Capillary Refill: Capillary refill takes less than 2 seconds.  Neurological:     General: No focal deficit present.     Mental Status: She is alert and oriented to person, place, and time.  Psychiatric:        Attention and Perception: Attention and perception normal.        Mood and Affect: Affect normal. Mood is depressed.        Speech: Speech normal.        Behavior: Behavior normal. Behavior is cooperative.        Thought Content: Thought content normal.        Cognition and Memory: Cognition and memory normal.        Judgment: Judgment normal.     Results for orders placed or performed in visit on 10/31/22  Aldosterone + renin activity w/ ratio  Result Value Ref Range   Aldosterone 4.8 0.0 - 30.0 ng/dL   Renin Activity, Plasma 1.367 0.167 - 5.380 ng/mL/hr   Aldos/Renin Ratio 3.5 0.0 - 30.0  ACTH  Result Value Ref Range   ACTH 8.9 7.2 - 63.3 pg/mL  Cortisol  Result Value Ref Range   Cortisol 9.4 6.2 - 19.4 ug/dL  Prolactin  Result Value Ref Range   Prolactin 21.3 3.6 - 25.2 ng/mL  Lithium level  Result Value Ref Range   Lithium Lvl 1.1 0.5 - 1.2 mmol/L    Assessment & Plan    Prolactin increased Assessment & Plan: Recheck labs. -Prolactin -Aldosterone -Cortisol -ACTH -Will get MRI of the brain for further  evaluation.  Orders: -     Prolactin; Future -     Cortisol; Future -     ACTH; Future -     Aldosterone + renin activity w/ ratio; Future -  MR BRAIN W WO CONTRAST; Future  Moderate major depression (HCC) Assessment & Plan: Reviewed hospitalization records with patient. -Currently improved and stable. -Continue regular visits with psychiatry and therapy. -Check lithium level.  Orders: -     Lithium level  Generalized anxiety disorder Assessment & Plan: -Currently improved and stable. -Continue regular visits with psychiatry and therapy.  Orders: -     Lithium level  Thyroid nodule Assessment & Plan: New thyroid ultrasound ordered for further evaluation. -Refer to endocrinology as indicated.  Orders: -     US THYROID; Future  Other orders -     Lithium level     Return for as scheduled.         Carlean Jews, NP  [facility] Primary Care at [facility] [phone] (phone) [phone] (fax)  [facility]

## 2022-11-01 DIAGNOSIS — H903 Sensorineural hearing loss, bilateral: Secondary | ICD-10-CM | POA: Diagnosis not present

## 2022-11-01 DIAGNOSIS — H6123 Impacted cerumen, bilateral: Secondary | ICD-10-CM | POA: Diagnosis not present

## 2022-11-01 LAB — PROLACTIN: Prolactin: 21.3 ng/mL (ref 3.6–25.2)

## 2022-11-01 LAB — ALDOSTERONE + RENIN ACTIVITY W/ RATIO

## 2022-11-01 LAB — CORTISOL: Cortisol: 9.4 ug/dL (ref 6.2–19.4)

## 2022-11-05 ENCOUNTER — Other Ambulatory Visit: Payer: Federal, State, Local not specified - PPO

## 2022-11-05 LAB — ALDOSTERONE + RENIN ACTIVITY W/ RATIO: Aldosterone: 4.8 ng/dL (ref 0.0–30.0)

## 2022-11-07 LAB — LITHIUM LEVEL: Lithium Lvl: 1.1 mmol/L (ref 0.5–1.2)

## 2022-11-07 LAB — ACTH: ACTH: 8.9 pg/mL (ref 7.2–63.3)

## 2022-11-16 ENCOUNTER — Other Ambulatory Visit (HOSPITAL_COMMUNITY): Payer: Self-pay | Admitting: Psychiatry

## 2022-11-16 ENCOUNTER — Ambulatory Visit
Admission: RE | Admit: 2022-11-16 | Discharge: 2022-11-16 | Disposition: A | Discharge status: Home / Self Care | Payer: Federal, State, Local not specified - PPO | Source: Ambulatory Visit | Attending: Nurse Practitioner | Admitting: Nurse Practitioner

## 2022-11-16 DIAGNOSIS — F310 Bipolar disorder, current episode hypomanic: Secondary | ICD-10-CM

## 2022-11-16 DIAGNOSIS — E041 Nontoxic single thyroid nodule: Secondary | ICD-10-CM

## 2022-11-18 DIAGNOSIS — E041 Nontoxic single thyroid nodule: Secondary | ICD-10-CM | POA: Insufficient documentation

## 2022-11-18 DIAGNOSIS — R7989 Other specified abnormal findings of blood chemistry: Secondary | ICD-10-CM | POA: Insufficient documentation

## 2022-11-18 NOTE — Assessment & Plan Note (Addendum)
Reviewed hospitalization records with patient. -Currently improved and stable. -Continue regular visits with psychiatry and therapy. -Check lithium level.

## 2022-11-18 NOTE — Assessment & Plan Note (Signed)
>>  ASSESSMENT AND PLAN FOR MODERATE MAJOR DEPRESSION (HCC) WRITTEN ON 11/18/2022 12:38 PM BY BOSCIA, HEATHER E, NP  Reviewed hospitalization records with patient. -Currently improved and stable. -Continue regular visits with psychiatry and therapy. -Check lithium  level.

## 2022-11-18 NOTE — Assessment & Plan Note (Signed)
-  Currently improved and stable. -Continue regular visits with psychiatry and therapy.

## 2022-11-18 NOTE — Assessment & Plan Note (Signed)
New thyroid ultrasound ordered for further evaluation. -Refer to endocrinology as indicated.

## 2022-11-18 NOTE — Assessment & Plan Note (Signed)
Recheck labs. -Prolactin -Aldosterone -Cortisol -ACTH -Will get MRI of the brain for further evaluation.

## 2022-11-19 ENCOUNTER — Encounter: Payer: Self-pay | Admitting: Nurse Practitioner

## 2022-11-21 DIAGNOSIS — Z83719 Family history of colon polyps, unspecified: Secondary | ICD-10-CM | POA: Diagnosis not present

## 2022-11-21 DIAGNOSIS — Z1211 Encounter for screening for malignant neoplasm of colon: Secondary | ICD-10-CM | POA: Diagnosis not present

## 2022-11-26 DIAGNOSIS — N950 Postmenopausal bleeding: Secondary | ICD-10-CM | POA: Diagnosis not present

## 2022-11-26 DIAGNOSIS — N939 Abnormal uterine and vaginal bleeding, unspecified: Secondary | ICD-10-CM | POA: Diagnosis not present

## 2022-11-27 ENCOUNTER — Encounter (HOSPITAL_COMMUNITY): Payer: Self-pay | Admitting: Psychiatry

## 2022-11-27 ENCOUNTER — Ambulatory Visit (HOSPITAL_BASED_OUTPATIENT_CLINIC_OR_DEPARTMENT_OTHER): Payer: Federal, State, Local not specified - PPO | Admitting: Psychiatry

## 2022-11-27 ENCOUNTER — Other Ambulatory Visit: Payer: Self-pay

## 2022-11-27 VITALS — BP 110/72 | HR 94 | Ht 66.0 in | Wt 151.2 lb

## 2022-11-27 DIAGNOSIS — F411 Generalized anxiety disorder: Secondary | ICD-10-CM

## 2022-11-27 DIAGNOSIS — F5101 Primary insomnia: Secondary | ICD-10-CM

## 2022-11-27 DIAGNOSIS — F310 Bipolar disorder, current episode hypomanic: Secondary | ICD-10-CM | POA: Diagnosis not present

## 2022-11-27 MED ORDER — ARIPIPRAZOLE 10 MG PO TABS
10.0000 mg | ORAL_TABLET | Freq: Every day | ORAL | 0 refills | Status: DC
Start: 2022-11-27 — End: 2023-01-10

## 2022-11-27 MED ORDER — TRAZODONE HCL 100 MG PO TABS
100.0000 mg | ORAL_TABLET | Freq: Every day | ORAL | 0 refills | Status: DC
Start: 2022-11-27 — End: 2023-01-10
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
## 2022-11-28 DIAGNOSIS — F4323 Adjustment disorder with mixed anxiety and depressed mood: Secondary | ICD-10-CM | POA: Diagnosis not present

## 2022-12-03 ENCOUNTER — Ambulatory Visit (HOSPITAL_BASED_OUTPATIENT_CLINIC_OR_DEPARTMENT_OTHER): Payer: Federal, State, Local not specified - PPO

## 2022-12-03 ENCOUNTER — Other Ambulatory Visit: Payer: Self-pay

## 2022-12-03 ENCOUNTER — Encounter (HOSPITAL_COMMUNITY): Payer: Self-pay

## 2022-12-03 ENCOUNTER — Other Ambulatory Visit (HOSPITAL_COMMUNITY): Payer: Self-pay | Admitting: Psychiatry

## 2022-12-03 VITALS — BP 108/74 | HR 68 | Ht 66.0 in | Wt 151.0 lb

## 2022-12-03 DIAGNOSIS — F310 Bipolar disorder, current episode hypomanic: Secondary | ICD-10-CM | POA: Diagnosis not present

## 2022-12-03 NOTE — Progress Notes (Signed)
Patient came in today for her due lithium level. Patient was pleasant and cooperative. We drew the Lithium level from her right AC, patient tolerated well and without complaint.

## 2022-12-04 ENCOUNTER — Telehealth (HOSPITAL_COMMUNITY): Payer: Self-pay | Admitting: *Deleted

## 2022-12-04 LAB — LITHIUM LEVEL: Lithium Lvl: 0.9 mmol/L (ref 0.5–1.2)

## 2022-12-04 NOTE — Telephone Encounter (Signed)
Lithium Level results received from [facility]. Drawn 12/03/22.   Resulted today @ WNL of 0.9 mmol/L

## 2022-12-08 ENCOUNTER — Ambulatory Visit
Admission: RE | Admit: 2022-12-08 | Discharge: 2022-12-08 | Disposition: A | Discharge status: Home / Self Care | Payer: Federal, State, Local not specified - PPO | Source: Ambulatory Visit | Attending: Nurse Practitioner | Admitting: Nurse Practitioner

## 2022-12-08 DIAGNOSIS — E221 Hyperprolactinemia: Secondary | ICD-10-CM | POA: Diagnosis not present

## 2022-12-08 DIAGNOSIS — R7989 Other specified abnormal findings of blood chemistry: Secondary | ICD-10-CM

## 2022-12-08 MED ORDER — GADOPICLENOL 0.5 MMOL/ML IV SOLN
7.0000 mL | Freq: Once | INTRAVENOUS | Status: AC | PRN
  Administered 2022-12-08: 7 mL via INTRAVENOUS

## 2022-12-17 ENCOUNTER — Ambulatory Visit (INDEPENDENT_AMBULATORY_CARE_PROVIDER_SITE_OTHER): Payer: Federal, State, Local not specified - PPO | Admitting: Family Medicine

## 2022-12-17 ENCOUNTER — Encounter: Payer: Self-pay | Admitting: Family Medicine

## 2022-12-17 ENCOUNTER — Encounter: Payer: Federal, State, Local not specified - PPO | Admitting: Nurse Practitioner

## 2022-12-17 VITALS — BP 112/76 | HR 81 | Ht 66.0 in | Wt 149.4 lb

## 2022-12-17 DIAGNOSIS — E782 Mixed hyperlipidemia: Secondary | ICD-10-CM | POA: Diagnosis not present

## 2022-12-17 DIAGNOSIS — F3164 Bipolar disorder, current episode mixed, severe, with psychotic features: Secondary | ICD-10-CM | POA: Diagnosis not present

## 2022-12-17 DIAGNOSIS — R7989 Other specified abnormal findings of blood chemistry: Secondary | ICD-10-CM

## 2022-12-17 MED ORDER — ROSUVASTATIN CALCIUM 5 MG PO TABS: 5.0000 mg | ORAL_TABLET | Freq: Every day | ORAL | 1 refills | Status: DC

## 2022-12-17 NOTE — Assessment & Plan Note (Signed)
Noncompliant with medication but willing to restart. - Restart Crestor 5 mg - Follow-up cholesterol level in 6 to 8 weeks.

## 2022-12-17 NOTE — Assessment & Plan Note (Signed)
Repeat level was within normal limits.  MRI results pending.  MRI was done 9 days ago, will reach out to radiology regarding results.

## 2022-12-17 NOTE — Assessment & Plan Note (Signed)
Sees Dr. Everlena Cooper.  Is on Abilify, lithium, trazodone for sleep.  No concerns.

## 2022-12-17 NOTE — Progress Notes (Signed)
   Established Patient Office Visit  Subjective   Patient ID: Christine Bishop, female    DOB: 02-25-1966  Age: 57 y.o. MRN: [id number]  Chief Complaint  Patient presents with   Annual Exam    HPI Bipolar depression Started on Abilify and lithium.  Also on Remeron.  Following up with psych:?  Not taking remeron.  Trazadone 100mg  - dr. Everlena Cooper.  Patient continues to take her Abilify and lithium.  States lithium makes her tired.  No longer taking Remeron.  Is taking trazodone 100 mg instead.  Psychiatrist is Dr. Everlena Cooper.  Thyroid nodule-small nodule with no further workup recommended.  Patient aware of results.  No questions.  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  Discussed that MRI brain results are not read yet.  Will call patient with results when we get them.  HLD-patient no longer taking statin because it "fell through the cracks" with all your other issues going on.  Not having any side effects from it when she was on it.  Willing to restart it.  Patient lives with her husband, daughter and son-in-law.  She works as a Engineer, civil (consulting) at [employer].  She would like her medications to go to [facility] [place].   Patient gets Pap smears and mammograms at [facility].  Is up-to-date.  The 10-year ASCVD risk score (Arnett DK, et al., 2019) is: 2.4%     ROS    Objective:     BP 112/76   Pulse 81   Ht 5\' 6"  (1.676 m)   Wt 149 lb 6.4 oz (67.8 kg)   SpO2 99%   BMI 24.11 kg/m    Physical Exam General: Alert, oriented.  No acute distress HEENT: PERRLA, EOMI, moist mucosa CV: Regular rate and rhythm, no murmurs Pulmonary: Lungs clear bilaterally GI: Soft, normal bowel sounds MSK: Strength equal bilaterally, normal gait Skin: No rashes, warm and dry Psych: Pleasant affect.   No results found for any visits on 12/17/22.        Assessment & Plan:   Mixed hyperlipidemia Assessment & Plan: Noncompliant with  medication but willing to restart. - Restart Crestor 5 mg - Follow-up cholesterol level in 6 to 8 weeks.  Orders: -     Lipid panel; Future  Bipolar I disorder, most recent episode mixed, severe with psychotic features [facility]) Assessment & Plan: Sees Dr. Everlena Cooper.  Is on Abilify, lithium, trazodone for sleep.  No concerns.   Prolactin increased Assessment & Plan: Repeat level was within normal limits.  MRI results pending.  MRI was done 9 days ago, will reach out to radiology regarding results.   Other orders -     Rosuvastatin Calcium; Take 1 tablet (5 mg total) by mouth daily.  Dispense: 90 tablet; Refill: 1     Return in about 1 year (around 12/17/2023) for physical.    Sandre Kitty, MD

## 2022-12-17 NOTE — Patient Instructions (Signed)
It was nice to see you today,  We addressed the following topics today: -We restarted your cholesterol medicine - I will need you to follow-up in 6 to 8 weeks for a lab visit to recheck your cholesterol levels  Have a great day,  Frederic Jericho, MD

## 2022-12-18 ENCOUNTER — Encounter: Payer: Self-pay | Admitting: Family Medicine

## 2022-12-19 NOTE — Progress Notes (Signed)
I ordered this MRI for the patient, but she saw you in July. Things look ok. She sees psychiatry routinely. I don't think she sees neurology and not sure she needs one.  Hope things are well.  Christine Bishop

## 2023-01-01 ENCOUNTER — Telehealth (HOSPITAL_COMMUNITY): Payer: Self-pay | Admitting: *Deleted

## 2023-01-01 NOTE — Telephone Encounter (Signed)
I received a call from pt of Dr. Mercy Riding' husband, Christine Bishop. Pt's husband is concerned about pt lethargy and "brain fog" for the last month or so. Husband describes what sounds like psychomotor retardation and slow response. This nurse did reach out to pt and she endorses these symptoms. Pt says she is not depressed or does not have any SI. She is working but says she's really struggling as she feels "exhausted" all the time. Pt admits she has been feeling this way prior to last visit on 11/27/22 when Remeron was d/c and Trazodone 100 mg at bedtime prn was continued. Writer advised pt f/u with PCP (Dr. Constance Bishop) and pt says she can't get in this week but has an appointment next week. Pt and spouse still wanted to be advised. Please review and advise. Thanks.

## 2023-01-03 NOTE — Telephone Encounter (Signed)
Thank you :)
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
## 2023-01-10 ENCOUNTER — Telehealth (HOSPITAL_BASED_OUTPATIENT_CLINIC_OR_DEPARTMENT_OTHER): Payer: Federal, State, Local not specified - PPO | Admitting: Psychiatry

## 2023-01-10 ENCOUNTER — Encounter (HOSPITAL_COMMUNITY): Payer: Self-pay | Admitting: Psychiatry

## 2023-01-10 DIAGNOSIS — F411 Generalized anxiety disorder: Secondary | ICD-10-CM

## 2023-01-10 DIAGNOSIS — Z79899 Other long term (current) drug therapy: Secondary | ICD-10-CM | POA: Diagnosis not present

## 2023-01-10 DIAGNOSIS — F310 Bipolar disorder, current episode hypomanic: Secondary | ICD-10-CM | POA: Diagnosis not present

## 2023-01-10 MED ORDER — TRAZODONE HCL 100 MG PO TABS
100.0000 mg | ORAL_TABLET | Freq: Every day | ORAL | 0 refills | Status: DC
Start: 2023-01-10 — End: 2023-04-04

## 2023-01-10 MED ORDER — LITHIUM CARBONATE 300 MG PO CAPS: 300.0000 mg | ORAL_CAPSULE | Freq: Two times a day (BID) | ORAL | 0 refills | Status: DC

## 2023-01-10 MED ORDER — ARIPIPRAZOLE 10 MG PO TABS
10.0000 mg | ORAL_TABLET | Freq: Every day | ORAL | 0 refills | Status: DC
Start: 2023-01-10 — End: 2023-03-07

## 2023-01-18 ENCOUNTER — Other Ambulatory Visit: Payer: Self-pay

## 2023-01-18 ENCOUNTER — Other Ambulatory Visit (HOSPITAL_COMMUNITY): Payer: Self-pay | Admitting: Psychiatry

## 2023-01-18 ENCOUNTER — Ambulatory Visit (HOSPITAL_BASED_OUTPATIENT_CLINIC_OR_DEPARTMENT_OTHER): Payer: Federal, State, Local not specified - PPO

## 2023-01-18 DIAGNOSIS — Z79899 Other long term (current) drug therapy: Secondary | ICD-10-CM

## 2023-01-18 NOTE — Progress Notes (Signed)
Patient arrived today for her due lithium level. Patient was pleasant and cooperative. Blood was drawn in patients right AC, patient tolerated well and without complaint.

## 2023-01-19 LAB — SPECIMEN STATUS REPORT

## 2023-01-19 LAB — LITHIUM LEVEL: Lithium Lvl: 0.6 mmol/L (ref 0.5–1.2)

## 2023-02-06 ENCOUNTER — Ambulatory Visit (HOSPITAL_BASED_OUTPATIENT_CLINIC_OR_DEPARTMENT_OTHER): Payer: Federal, State, Local not specified - PPO | Attending: Psychiatry | Admitting: Internal Medicine

## 2023-02-06 DIAGNOSIS — F310 Bipolar disorder, current episode hypomanic: Secondary | ICD-10-CM | POA: Diagnosis not present

## 2023-02-06 DIAGNOSIS — F5101 Primary insomnia: Secondary | ICD-10-CM | POA: Diagnosis not present

## 2023-02-06 DIAGNOSIS — I493 Ventricular premature depolarization: Secondary | ICD-10-CM | POA: Insufficient documentation

## 2023-02-06 DIAGNOSIS — F411 Generalized anxiety disorder: Secondary | ICD-10-CM | POA: Insufficient documentation

## 2023-02-07 ENCOUNTER — Other Ambulatory Visit: Payer: Federal, State, Local not specified - PPO

## 2023-02-07 DIAGNOSIS — E782 Mixed hyperlipidemia: Secondary | ICD-10-CM | POA: Diagnosis not present

## 2023-02-08 LAB — LIPID PANEL
Chol/HDL Ratio: 3.2 ratio (ref 0.0–4.4)
Cholesterol, Total: 144 mg/dL (ref 100–199)
HDL: 45 mg/dL (ref >39)
LDL Chol Calc (NIH): 81 mg/dL (ref 0–99)
Triglycerides: 93 mg/dL (ref 0–149)
VLDL Cholesterol Cal: 18 mg/dL (ref 5–40)

## 2023-02-09 DIAGNOSIS — F310 Bipolar disorder, current episode hypomanic: Secondary | ICD-10-CM | POA: Diagnosis not present

## 2023-02-09 NOTE — Procedures (Signed)
Patient Name: Christine Bishop, Yingling Date: 02/06/2023 Gender: Female D.O.B: 1966/02/15 Age (years): 56 Referring Provider: Stasia Cavalier Height (inches): 66 Interpreting Physician: Jetty Duhamel MD, ABSM Weight (lbs): 145 RPSGT: Lowry Ram BMI: 23 MRN: [id number] Neck Size: 13.00  CLINICAL INFORMATION Sleep Study Type: NPSG Indication for sleep study: Daytime Fatigue, Fatigue, Insomnia, Snoring Epworth Sleepiness Score: 17  SLEEP STUDY TECHNIQUE As per the AASM Manual for the Scoring of Sleep and Associated Events v2.3 (April 2016) with a hypopnea requiring 4% desaturations.  The channels recorded and monitored were frontal, central and occipital EEG, electrooculogram (EOG), submentalis EMG (chin), nasal and oral airflow, thoracic and abdominal wall motion, anterior tibialis EMG, snore microphone, electrocardiogram, and pulse oximetry.  MEDICATIONS Medications self-administered by patient taken the night of the study : Trazodone  SLEEP ARCHITECTURE The study was initiated at 10:18:56 PM and ended at 4:52:33 AM.  Sleep onset time was 70.0 minutes and the sleep efficiency was 61.7%. The total sleep time was 243 minutes.  Stage REM latency was 45.0 minutes.  The patient spent 0.8% of the night in stage N1 sleep, 79.4% in stage N2 sleep, 0.0% in stage N3 and 19.8% in REM.  Alpha intrusion was absent.  Supine sleep was 62.76%.  RESPIRATORY PARAMETERS The overall apnea/hypopnea index (AHI) was 0.5 per hour. There were 2 total apneas, including 1 obstructive, 1 central and 0 mixed apneas. There were 0 hypopneas and 3 RERAs.  The AHI during Stage REM sleep was 2.5 per hour.  AHI while supine was 0.8 per hour.  The mean oxygen saturation was 96.1%. The minimum SpO2 during sleep was 93.0%.  soft snoring was noted during this study.  CARDIAC DATA The 2 lead EKG demonstrated sinus rhythm. The mean heart rate was 72.0 beats per minute. Other EKG findings include:  None.  LEG MOVEMENT DATA The total PLMS were 0 with a resulting PLMS index of 0.0. Associated arousal with leg movement index was 0.0 .  IMPRESSIONS - No significant obstructive sleep apnea occurred during this study (AHI = 0.5/h). - No significant central sleep apnea occurred during this study (CAI = 0.2/h). - The patient had minimal or no oxygen desaturation during the study (Min O2 = 93.0%) - The patient snored with soft snoring volume. - No cardiac abnormalities were noted during this study- occasional PVC. - Clinically significant periodic limb movements did not occur during sleep. No significant associated arousals. - Patient had difficulty initiating and maintaining sleep despite Trazodone, with sleep onset about 11:30 and mostly awake after 03:15.  DIAGNOSIS - Normal study - Insomnia  RECOMMENDATIONS - Consider if further management for insomnia would be helpful. - Sleep hygiene should be reviewed to assess factors that may improve sleep quality. - Weight management and regular exercise should be initiated or continued if appropriate.  [Electronically signed] 02/09/2023 01:00 PM  Jetty Duhamel MD, ABSM Diplomate, American Board of Sleep Medicine NPI: [id number]                          Jetty Duhamel Diplomate, American Board of Sleep Medicine  ELECTRONICALLY SIGNED ON:  02/09/2023, 1:01 PM [facility] PH: [phone]   FX: [phone] ACCREDITED BY THE AMERICAN ACADEMY OF SLEEP MEDICINE

## 2023-02-13 ENCOUNTER — Telehealth (HOSPITAL_COMMUNITY): Payer: Self-pay

## 2023-02-13 NOTE — Telephone Encounter (Signed)
Patient is calling with concerns about involuntary tongue movement. Patient was recently started on Abilify. She said her tongue will roll around her mouth and makes a clicking sound on her teeth, she cannot stop it. Please review and advise, thank you

## 2023-02-14 ENCOUNTER — Encounter: Payer: Self-pay | Admitting: Dermatology

## 2023-02-14 ENCOUNTER — Ambulatory Visit: Payer: Federal, State, Local not specified - PPO | Admitting: Dermatology

## 2023-02-14 VITALS — BP 106/67 | HR 89

## 2023-02-14 DIAGNOSIS — Z1283 Encounter for screening for malignant neoplasm of skin: Secondary | ICD-10-CM | POA: Diagnosis not present

## 2023-02-14 DIAGNOSIS — W908XXA Exposure to other nonionizing radiation, initial encounter: Secondary | ICD-10-CM

## 2023-02-14 DIAGNOSIS — Z86018 Personal history of other benign neoplasm: Secondary | ICD-10-CM

## 2023-02-14 DIAGNOSIS — D229 Melanocytic nevi, unspecified: Secondary | ICD-10-CM

## 2023-02-14 DIAGNOSIS — L814 Other melanin hyperpigmentation: Secondary | ICD-10-CM

## 2023-02-14 DIAGNOSIS — L578 Other skin changes due to chronic exposure to nonionizing radiation: Secondary | ICD-10-CM

## 2023-02-14 DIAGNOSIS — D1801 Hemangioma of skin and subcutaneous tissue: Secondary | ICD-10-CM

## 2023-02-14 DIAGNOSIS — L821 Other seborrheic keratosis: Secondary | ICD-10-CM

## 2023-02-14 NOTE — Progress Notes (Signed)
   Follow-Up Visit   Subjective  Christine Bishop is a 57 y.o. female who presents for the following: Skin Cancer Screening and Full Body Skin Exam hx of dysplastic   The patient presents for Total-Body Skin Exam (TBSE) for skin cancer screening and mole check. The patient has spots, moles and lesions to be evaluated, some may be new or changing and the patient may have concern these could be cancer.   The following portions of the chart were reviewed this encounter and updated as appropriate: medications, allergies, medical history  Review of Systems:  No other skin or systemic complaints except as noted in HPI or Assessment and Plan.  Objective  Well appearing patient in no apparent distress; mood and affect are within normal limits.  A full examination was performed including scalp, head, eyes, ears, nose, lips, neck, chest, axillae, abdomen, back, buttocks, bilateral upper extremities, bilateral lower extremities, hands, feet, fingers, toes, fingernails, and toenails. All findings within normal limits unless otherwise noted below.   Relevant physical exam findings are noted in the Assessment and Plan.    Assessment & Plan   SKIN CANCER SCREENING PERFORMED TODAY.  ACTINIC DAMAGE - Chronic condition, secondary to cumulative UV/sun exposure - diffuse scaly erythematous macules with underlying dyspigmentation - Recommend daily broad spectrum sunscreen SPF 30+ to sun-exposed areas, reapply every 2 hours as needed.  - Staying in the shade or wearing long sleeves, sun glasses (UVA+UVB protection) and wide brim hats (4-inch brim around the entire circumference of the hat) are also recommended for sun protection.  - Call for new or changing lesions.  LENTIGINES, SEBORRHEIC KERATOSES, HEMANGIOMAS - Benign normal skin lesions - Benign-appearing - Call for any changes  MELANOCYTIC NEVI - Tan-brown and/or pink-flesh-colored symmetric macules and papules - Benign appearing on exam  today - Observation - Call clinic for new or changing moles - Recommend daily use of broad spectrum spf 30+ sunscreen to sun-exposed areas.   HISTORY OF DYSPLASTIC NEVUS See history  No evidence of recurrence today Recommend regular full body skin exams Recommend daily broad spectrum sunscreen SPF 30+ to sun-exposed areas, reapply every 2 hours as needed.  Call if any new or changing lesions are noted between office visits   Return in about 1 year (around 02/14/2024) for TBSE.  IAsher Muir, CMA, am acting as scribe for Armida Sans, MD.   Documentation: I have reviewed the above documentation for accuracy and completeness, and I agree with the above.  Armida Sans, MD

## 2023-02-14 NOTE — Patient Instructions (Signed)

## 2023-02-16 ENCOUNTER — Other Ambulatory Visit (HOSPITAL_COMMUNITY): Payer: Self-pay | Admitting: Psychiatry

## 2023-02-16 DIAGNOSIS — F310 Bipolar disorder, current episode hypomanic: Secondary | ICD-10-CM

## 2023-02-16 DIAGNOSIS — F411 Generalized anxiety disorder: Secondary | ICD-10-CM

## 2023-02-18 ENCOUNTER — Encounter (HOSPITAL_COMMUNITY): Payer: Self-pay

## 2023-02-18 DIAGNOSIS — G2401 Drug induced subacute dyskinesia: Secondary | ICD-10-CM

## 2023-02-18 NOTE — Telephone Encounter (Signed)
Please review

## 2023-02-19 DIAGNOSIS — F4323 Adjustment disorder with mixed anxiety and depressed mood: Secondary | ICD-10-CM | POA: Diagnosis not present
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
## 2023-03-07 ENCOUNTER — Encounter (HOSPITAL_COMMUNITY): Payer: Self-pay | Admitting: Psychiatry

## 2023-03-07 ENCOUNTER — Telehealth (HOSPITAL_COMMUNITY): Payer: Federal, State, Local not specified - PPO | Admitting: Psychiatry

## 2023-03-07 DIAGNOSIS — F310 Bipolar disorder, current episode hypomanic: Secondary | ICD-10-CM | POA: Diagnosis not present

## 2023-03-07 DIAGNOSIS — F411 Generalized anxiety disorder: Secondary | ICD-10-CM | POA: Diagnosis not present

## 2023-03-08 DIAGNOSIS — E785 Hyperlipidemia, unspecified: Secondary | ICD-10-CM | POA: Diagnosis not present

## 2023-03-08 DIAGNOSIS — N390 Urinary tract infection, site not specified: Secondary | ICD-10-CM | POA: Diagnosis not present

## 2023-03-08 DIAGNOSIS — F39 Unspecified mood [affective] disorder: Secondary | ICD-10-CM | POA: Diagnosis not present

## 2023-03-08 DIAGNOSIS — R300 Dysuria: Secondary | ICD-10-CM | POA: Diagnosis not present

## 2023-03-08 DIAGNOSIS — G43909 Migraine, unspecified, not intractable, without status migrainosus: Secondary | ICD-10-CM | POA: Diagnosis not present

## 2023-03-12 DIAGNOSIS — R197 Diarrhea, unspecified: Secondary | ICD-10-CM | POA: Diagnosis not present

## 2023-03-12 DIAGNOSIS — Z20822 Contact with and (suspected) exposure to covid-19: Secondary | ICD-10-CM | POA: Diagnosis not present

## 2023-04-01 DIAGNOSIS — R634 Abnormal weight loss: Secondary | ICD-10-CM | POA: Diagnosis not present

## 2023-04-01 DIAGNOSIS — R197 Diarrhea, unspecified: Secondary | ICD-10-CM | POA: Diagnosis not present

## 2023-04-01 DIAGNOSIS — K625 Hemorrhage of anus and rectum: Secondary | ICD-10-CM | POA: Diagnosis not present
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
## 2023-04-03 ENCOUNTER — Other Ambulatory Visit (HOSPITAL_COMMUNITY): Payer: Self-pay | Admitting: Psychiatry

## 2023-04-03 DIAGNOSIS — L239 Allergic contact dermatitis, unspecified cause: Secondary | ICD-10-CM | POA: Diagnosis not present

## 2023-04-03 DIAGNOSIS — N390 Urinary tract infection, site not specified: Secondary | ICD-10-CM | POA: Diagnosis not present

## 2023-04-03 DIAGNOSIS — F310 Bipolar disorder, current episode hypomanic: Secondary | ICD-10-CM

## 2023-04-04 ENCOUNTER — Telehealth (HOSPITAL_COMMUNITY): Payer: Federal, State, Local not specified - PPO | Admitting: Psychiatry

## 2023-04-04 DIAGNOSIS — G2401 Drug induced subacute dyskinesia: Secondary | ICD-10-CM | POA: Diagnosis not present

## 2023-04-04 DIAGNOSIS — T43505A Adverse effect of unspecified antipsychotics and neuroleptics, initial encounter: Secondary | ICD-10-CM

## 2023-04-04 DIAGNOSIS — R197 Diarrhea, unspecified: Secondary | ICD-10-CM | POA: Diagnosis not present

## 2023-04-04 DIAGNOSIS — F310 Bipolar disorder, current episode hypomanic: Secondary | ICD-10-CM

## 2023-04-04 DIAGNOSIS — F411 Generalized anxiety disorder: Secondary | ICD-10-CM | POA: Diagnosis not present

## 2023-04-04 MED ORDER — LITHIUM CARBONATE 300 MG PO CAPS: 300.0000 mg | ORAL_CAPSULE | Freq: Two times a day (BID) | ORAL | 0 refills | Status: DC

## 2023-04-04 MED ORDER — TETRABENAZINE 12.5 MG PO TABS: 12.5000 mg | ORAL_TABLET | Freq: Two times a day (BID) | ORAL | 1 refills | Status: DC

## 2023-04-04 MED ORDER — BENZTROPINE MESYLATE 1 MG PO TABS: 1.0000 mg | ORAL_TABLET | Freq: Every day | ORAL | 2 refills | Status: DC

## 2023-04-04 MED ORDER — TRAZODONE HCL 100 MG PO TABS: 100.0000 mg | ORAL_TABLET | Freq: Every day | ORAL | 0 refills | Status: DC

## 2023-04-05 ENCOUNTER — Encounter (HOSPITAL_COMMUNITY): Payer: Self-pay | Admitting: Psychiatry

## 2023-04-17 ENCOUNTER — Telehealth (HOSPITAL_COMMUNITY): Payer: Self-pay | Admitting: *Deleted

## 2023-04-17 NOTE — Telephone Encounter (Signed)
PA for Tetrabenazine 12.5 mg has been Approved by [employer]. Authorization approved from 03/18/23 through 04/16/24.

## 2023-04-17 NOTE — Telephone Encounter (Signed)
PA for Tetrabenazine 12.5 mg tablets has been submitted to pt insurance via CoverMyMeds portal.

## 2023-04-27 ENCOUNTER — Other Ambulatory Visit: Payer: Self-pay | Admitting: Family Medicine

## 2023-04-30 ENCOUNTER — Encounter (HOSPITAL_COMMUNITY): Payer: Self-pay | Admitting: Psychiatry

## 2023-04-30 ENCOUNTER — Ambulatory Visit (HOSPITAL_BASED_OUTPATIENT_CLINIC_OR_DEPARTMENT_OTHER): Payer: Federal, State, Local not specified - PPO | Admitting: Psychiatry

## 2023-04-30 ENCOUNTER — Other Ambulatory Visit: Payer: Self-pay

## 2023-04-30 VITALS — BP 93/66 | HR 80 | Ht 66.0 in | Wt 143.0 lb

## 2023-04-30 DIAGNOSIS — F3132 Bipolar disorder, current episode depressed, moderate: Secondary | ICD-10-CM | POA: Diagnosis not present

## 2023-04-30 DIAGNOSIS — F411 Generalized anxiety disorder: Secondary | ICD-10-CM | POA: Diagnosis not present

## 2023-04-30 DIAGNOSIS — G2401 Drug induced subacute dyskinesia: Secondary | ICD-10-CM

## 2023-04-30 MED ORDER — BUPROPION HCL ER (XL) 150 MG PO TB24: 150.0000 mg | ORAL_TABLET | ORAL | 2 refills | Status: DC

## 2023-04-30 MED ORDER — TETRABENAZINE 25 MG PO TABS: 25.0000 mg | ORAL_TABLET | Freq: Two times a day (BID) | ORAL | 2 refills | Status: DC
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
## 2023-05-01 ENCOUNTER — Telehealth (HOSPITAL_COMMUNITY): Payer: Self-pay | Admitting: *Deleted

## 2023-05-01 NOTE — Telephone Encounter (Signed)
Writer spoke with [facility] regarding VM that was left for [place] concerning the tetrabenazine 25 mg BID. Writer verified dose increase and rationale for medication. Was informed that the medication may now be filled. Pt advised and verbalizes understanding.

## 2023-05-09 ENCOUNTER — Telehealth (HOSPITAL_COMMUNITY): Payer: Self-pay | Admitting: *Deleted

## 2023-05-09 NOTE — Telephone Encounter (Signed)
Per CoverMyMeds portal the PA for tetrabenazine 25 mg tabs BID "has been resolved. No additional PA required".

## 2023-05-09 NOTE — Telephone Encounter (Signed)
PA submitted via CoverMyMeds portal for tetrabenazine 25 mg BID, #60. Submitted for expedited review. This nurse spoke with [facility] on 05/01/23 and was advised that this medication could be filled without PA since it was a dose increase and verified quantity ok as well. However today received PA request from [facility]. PA also faxed to [facility]

## 2023-05-10 ENCOUNTER — Telehealth (HOSPITAL_COMMUNITY): Payer: Self-pay | Admitting: *Deleted

## 2023-05-10 DIAGNOSIS — G2401 Drug induced subacute dyskinesia: Secondary | ICD-10-CM

## 2023-05-10 NOTE — Telephone Encounter (Signed)
Pt PA status for the Tetrabenazine 25 mg BID is unknown. Per CoverMyMeds ([employer]) "PA has been resolved. No PA is required" as of 05/09/23. However [facility] has called LVM requesting call back about this medication. Writer did call back, again, speaking with several different people and departments with no answers. So I contacted [employer] and they [facility]. Writer again spoke with multiple departments and staff. I am unable to clarify what is happening with this medication. [facility] to call [facility]. Do you want to try a different medication or I can continue to pursue when we return to office on 05/13/23. Please review and advise.

## 2023-05-13 ENCOUNTER — Other Ambulatory Visit (HOSPITAL_COMMUNITY): Payer: Self-pay | Admitting: *Deleted

## 2023-05-13 DIAGNOSIS — G2401 Drug induced subacute dyskinesia: Secondary | ICD-10-CM

## 2023-05-13 DIAGNOSIS — A0472 Enterocolitis due to Clostridium difficile, not specified as recurrent: Secondary | ICD-10-CM | POA: Diagnosis not present

## 2023-05-13 DIAGNOSIS — F411 Generalized anxiety disorder: Secondary | ICD-10-CM

## 2023-05-13 DIAGNOSIS — F3132 Bipolar disorder, current episode depressed, moderate: Secondary | ICD-10-CM

## 2023-05-13 MED ORDER — TETRABENAZINE 12.5 MG PO TABS: 25.0000 mg | ORAL_TABLET | Freq: Two times a day (BID) | ORAL | 1 refills | Status: DC

## 2023-05-13 MED ORDER — TETRABENAZINE 25 MG PO TABS: ORAL_TABLET | ORAL | 0 refills | Status: DC

## 2023-05-13 NOTE — Addendum Note (Signed)
Addended by: Everlena Cooper on: 05/13/2023 12:12 PM   Modules accepted: Orders

## 2023-05-13 NOTE — Telephone Encounter (Signed)
Looks like you resent the 25 mg BID script with the 25 mg tabs. I sent another script for 25 mg BID with the 12.5 mg tabs. We can leave both and see if one goes through.

## 2023-05-13 NOTE — Telephone Encounter (Signed)
Will send new Rx for 12.5 mg 2 tabs every day.

## 2023-05-16 DIAGNOSIS — N951 Menopausal and female climacteric states: Secondary | ICD-10-CM | POA: Diagnosis not present

## 2023-05-16 DIAGNOSIS — N898 Other specified noninflammatory disorders of vagina: Secondary | ICD-10-CM | POA: Diagnosis not present

## 2023-05-16 DIAGNOSIS — R319 Hematuria, unspecified: Secondary | ICD-10-CM | POA: Diagnosis not present

## 2023-05-16 DIAGNOSIS — R300 Dysuria: Secondary | ICD-10-CM | POA: Diagnosis not present

## 2023-05-21 ENCOUNTER — Telehealth (HOSPITAL_COMMUNITY): Payer: Self-pay | Admitting: *Deleted

## 2023-05-21 NOTE — Telephone Encounter (Signed)
 Writer returned a call from [facility] regarding the tetrabenazine 12.5 mg tabs. Dose clarified and per pharmacist when medication was run she said it has approval and should go through this time. Will continue to monitor. FYI.

## 2023-05-22 ENCOUNTER — Other Ambulatory Visit (HOSPITAL_COMMUNITY): Payer: Self-pay | Admitting: Psychiatry

## 2023-05-22 DIAGNOSIS — F3132 Bipolar disorder, current episode depressed, moderate: Secondary | ICD-10-CM

## 2023-05-30 ENCOUNTER — Other Ambulatory Visit (HOSPITAL_COMMUNITY): Payer: Self-pay | Admitting: Psychiatry

## 2023-05-30 DIAGNOSIS — G2401 Drug induced subacute dyskinesia: Secondary | ICD-10-CM
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
## 2023-06-05 DIAGNOSIS — F4323 Adjustment disorder with mixed anxiety and depressed mood: Secondary | ICD-10-CM | POA: Diagnosis not present

## 2023-06-06 ENCOUNTER — Encounter (HOSPITAL_COMMUNITY): Payer: Self-pay | Admitting: Psychiatry

## 2023-06-06 ENCOUNTER — Telehealth (HOSPITAL_COMMUNITY): Payer: Federal, State, Local not specified - PPO | Admitting: Psychiatry

## 2023-06-06 DIAGNOSIS — F411 Generalized anxiety disorder: Secondary | ICD-10-CM | POA: Diagnosis not present

## 2023-06-06 DIAGNOSIS — G2401 Drug induced subacute dyskinesia: Secondary | ICD-10-CM | POA: Diagnosis not present

## 2023-06-06 DIAGNOSIS — F3132 Bipolar disorder, current episode depressed, moderate: Secondary | ICD-10-CM

## 2023-06-06 MED ORDER — TRAZODONE HCL 100 MG PO TABS: 100.0000 mg | ORAL_TABLET | Freq: Every day | ORAL | 0 refills | Status: DC

## 2023-06-06 MED ORDER — LITHIUM CARBONATE 300 MG PO CAPS: 300.0000 mg | ORAL_CAPSULE | Freq: Two times a day (BID) | ORAL | 0 refills | Status: DC

## 2023-06-06 MED ORDER — LAMOTRIGINE 25 MG PO TABS: ORAL_TABLET | ORAL | 1 refills | Status: DC

## 2023-06-14 DIAGNOSIS — G43909 Migraine, unspecified, not intractable, without status migrainosus: Secondary | ICD-10-CM | POA: Diagnosis not present

## 2023-06-14 DIAGNOSIS — R300 Dysuria: Secondary | ICD-10-CM | POA: Diagnosis not present

## 2023-06-14 DIAGNOSIS — F39 Unspecified mood [affective] disorder: Secondary | ICD-10-CM | POA: Diagnosis not present

## 2023-06-14 DIAGNOSIS — E785 Hyperlipidemia, unspecified: Secondary | ICD-10-CM | POA: Diagnosis not present

## 2023-06-14 DIAGNOSIS — N390 Urinary tract infection, site not specified: Secondary | ICD-10-CM | POA: Diagnosis not present

## 2023-06-28 ENCOUNTER — Other Ambulatory Visit (HOSPITAL_COMMUNITY): Payer: Self-pay | Admitting: Psychiatry

## 2023-06-28 DIAGNOSIS — G2401 Drug induced subacute dyskinesia: Secondary | ICD-10-CM

## 2023-06-29 ENCOUNTER — Other Ambulatory Visit (HOSPITAL_COMMUNITY): Payer: Self-pay | Admitting: Psychiatry

## 2023-06-29 DIAGNOSIS — F411 Generalized anxiety disorder: Secondary | ICD-10-CM

## 2023-07-04 ENCOUNTER — Other Ambulatory Visit (HOSPITAL_COMMUNITY): Payer: Self-pay | Admitting: Psychiatry

## 2023-07-04 DIAGNOSIS — F4323 Adjustment disorder with mixed anxiety and depressed mood: Secondary | ICD-10-CM | POA: Diagnosis not present

## 2023-07-04 DIAGNOSIS — F411 Generalized anxiety disorder: Secondary | ICD-10-CM

## 2023-07-04 DIAGNOSIS — F310 Bipolar disorder, current episode hypomanic: Secondary | ICD-10-CM

## 2023-07-10 ENCOUNTER — Encounter: Payer: Self-pay | Admitting: Family Medicine

## 2023-07-10 ENCOUNTER — Ambulatory Visit: Payer: Federal, State, Local not specified - PPO | Admitting: Family Medicine

## 2023-07-10 VITALS — BP 91/58 | HR 83 | Ht 66.0 in | Wt 136.1 lb

## 2023-07-10 DIAGNOSIS — R202 Paresthesia of skin: Secondary | ICD-10-CM | POA: Insufficient documentation

## 2023-07-10 NOTE — Patient Instructions (Signed)
It was nice to see you today,  We addressed the following topics today: -I am going to order some blood test so that we can do the initial workup of your tingling in your extremities.  If further workup is needed I will refer you to a neurologist.  Sometimes we need to do nerve conduction studies or other imaging. - No changes to your treatment otherwise.  Have a great day,  Frederic Jericho, MD

## 2023-07-10 NOTE — Assessment & Plan Note (Addendum)
Ongoing for the past month according to the patient, which is roughly around the same time she started Lamictal, although she is on a low-dose of Lamictal currently.  Also on multiple other psychiatric medications including trazodone, Wellbutrin, and lithium.  On exam there seems to be some global symmetric weakness in the extremities as well as decreased sensation affecting more so the median nerve in the upper extremity and affecting the feet medially more so than laterally. Per chart review, while she was seeing Eston Esters at [facility] neurology for migraines in 2019 there was workup done for possible MS.  I believe this was in response to MRI brain findings.  It seems she had a negative ANA testing and negative CSF.  Also had cervical spine MRI during that time for cervical radiculopathy on the right side. - Will get initial blood testing - Pending results, will refer to a neurologist before ordering any nerve conduction studies or additional imaging.  Patient prefers provider closer than her previous neurologist.

## 2023-07-10 NOTE — Progress Notes (Signed)
AP and  Established Patient Office Visit  Subjective   Patient ID: Christine Bishop, female    DOB: 1965-06-09  Age: 58 y.o. MRN: [id number]  Chief Complaint  Patient presents with   Numbness    HPI Patient here today at the request of her therapist due to paresthesia in her fingers and toes.  She states that has been going on for over a month.  Does not happen every day but when it does it lasts a few hours.  It affects all 4 limbs.  She also has noted increased clumsiness in the hands and feet.  Patient is currently taking lithium, trazodone, Lamictal, Wellbutrin, and tetrabenazine for tardive dyskinesia.  She has no longer taking her Abilify.  She states she is trying to taper off of lithium which is why the Lamictal was recently started.  No other changes to her medications during this time.  Patient has seen a neurologist at [facility] in the past for migraines.  States if we need to send her to another neurologist she would rather go somewhere local.  On Sunday the patient tripped over her dog bed in the dark and hit the back of her head on her wall.  Currently has bruising but no other symptoms at this time.   The 10-year ASCVD risk score (Arnett DK, et al., 2019) is: 1.2%  Health Maintenance Due  Topic Date Due   Hepatitis C Screening  Never done   DTaP/Tdap/Td (1 - Tdap) Never done   COVID-19 Vaccine (2 - Pfizer risk series) 11/04/2020   Cervical Cancer Screening (HPV/Pap Cotest)  06/24/2021   INFLUENZA VACCINE  12/20/2022      Objective:     BP (!) 91/58   Pulse 83   Ht 5\' 6"  (1.676 m)   Wt 136 lb 1.9 oz (61.7 kg)   SpO2 100%   BMI 21.97 kg/m    Physical Exam General: Alert, orient CV: Regular rate and rhythm Pulmonary: No respiratory stress Neuro: Decreased sensation to monofilament testing in the feet and palms bilaterally.  Medial aspect of the feet more affected than the lateral.  Sensation intact in the dorsal aspect of the hands.  Strength is equal  bilaterally but slightly diminished (4/5) globally.  Normal finger-to-nose testing.   No results found for any visits on 07/10/23.      Assessment & Plan:   Paresthesia Assessment & Plan: Ongoing for the past month according to the patient, which is roughly around the same time she started Lamictal, although she is on a low-dose of Lamictal currently.  Also on multiple other psychiatric medications including trazodone, Wellbutrin, and lithium.  On exam there seems to be some global symmetric weakness in the extremities as well as decreased sensation affecting more so the median nerve in the upper extremity and affecting the feet medially more so than laterally. Per chart review, while she was seeing Eston Esters at [facility] neurology for migraines in 2019 there was workup done for possible MS.  I believe this was in response to MRI brain findings.  It seems she had a negative ANA testing and negative CSF.  Also had cervical spine MRI during that time for cervical radiculopathy on the right side. - Will get initial blood testing - Pending results, will refer to a neurologist before ordering any nerve conduction studies or additional imaging.  Patient prefers provider closer than her previous neurologist.  Orders: -     Comprehensive metabolic panel -  B12 and Folate Panel -     TSH -     CBC -     Vitamin B1 -     Lithium level     Return in about 2 months (around 09/07/2023) for paresthesia.    Sandre Kitty, MD

## 2023-07-10 NOTE — Assessment & Plan Note (Signed)
>>  ASSESSMENT AND PLAN FOR PARESTHESIA WRITTEN ON 07/10/2023 11:10 AM BY Elaijah Munoz K, MD  Ongoing for the past month according to the patient, which is roughly around the same time she started Lamictal , although she is on a low-dose of Lamictal  currently.  Also on multiple other psychiatric medications including trazodone , Wellbutrin , and lithium .  On exam there seems to be some global symmetric weakness in the extremities as well as decreased sensation affecting more so the median nerve in the upper extremity and affecting the feet medially more so than laterally. Per chart review, while she was seeing Greig Rice at [facility] neurology for migraines in 2019 there was workup done for possible MS.  I believe this was in response to MRI brain findings.  It seems she had a negative ANA testing and negative CSF.  Also had cervical spine MRI during that time for cervical radiculopathy on the right side. - Will get initial blood testing - Pending results, will refer to a neurologist before ordering any nerve conduction studies or additional imaging.  Patient prefers provider closer than her previous neurologist.

## 2023-07-12 ENCOUNTER — Encounter: Payer: Self-pay | Admitting: Family Medicine

## 2023-07-15 ENCOUNTER — Other Ambulatory Visit: Payer: Self-pay | Admitting: Family Medicine

## 2023-07-15 DIAGNOSIS — G629 Polyneuropathy, unspecified: Secondary | ICD-10-CM
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
## 2023-07-16 ENCOUNTER — Encounter: Payer: Self-pay | Admitting: Neurology

## 2023-07-16 ENCOUNTER — Ambulatory Visit (HOSPITAL_BASED_OUTPATIENT_CLINIC_OR_DEPARTMENT_OTHER): Payer: Federal, State, Local not specified - PPO | Admitting: Psychiatry

## 2023-07-16 VITALS — BP 127/84 | HR 83 | Resp 20 | Ht 66.0 in | Wt 133.6 lb

## 2023-07-16 DIAGNOSIS — F3132 Bipolar disorder, current episode depressed, moderate: Secondary | ICD-10-CM

## 2023-07-16 DIAGNOSIS — G2401 Drug induced subacute dyskinesia: Secondary | ICD-10-CM | POA: Diagnosis not present

## 2023-07-16 DIAGNOSIS — F411 Generalized anxiety disorder: Secondary | ICD-10-CM | POA: Diagnosis not present

## 2023-07-16 MED ORDER — TETRABENAZINE 12.5 MG PO TABS: 25.0000 mg | ORAL_TABLET | Freq: Two times a day (BID) | ORAL | 2 refills | Status: DC

## 2023-07-16 MED ORDER — LAMOTRIGINE 100 MG PO TABS: 100.0000 mg | ORAL_TABLET | Freq: Every day | ORAL | 2 refills | Status: DC

## 2023-07-16 MED ORDER — BUPROPION HCL ER (XL) 300 MG PO TB24: 300.0000 mg | ORAL_TABLET | ORAL | 2 refills | Status: DC

## 2023-07-16 MED ORDER — LITHIUM CARBONATE 150 MG PO CAPS: 150.0000 mg | ORAL_CAPSULE | Freq: Two times a day (BID) | ORAL | 0 refills | Status: DC

## 2023-07-17 ENCOUNTER — Encounter (HOSPITAL_COMMUNITY): Payer: Self-pay | Admitting: Psychiatry

## 2023-07-18 LAB — CBC
Hematocrit: 39.2 % (ref 34.0–46.6)
Hemoglobin: 12.9 g/dL (ref 11.1–15.9)
MCH: 32.2 pg (ref 26.6–33.0)
MCHC: 32.9 g/dL (ref 31.5–35.7)
MCV: 98 fL — ABNORMAL HIGH (ref 79–97)
Platelets: 182 10*3/uL (ref 150–450)
RBC: 4.01 x10E6/uL (ref 3.77–5.28)
RDW: 11.8 % (ref 11.7–15.4)
WBC: 7.1 10*3/uL (ref 3.4–10.8)

## 2023-07-18 LAB — COMPREHENSIVE METABOLIC PANEL
ALT: 13 [IU]/L (ref 0–32)
AST: 20 [IU]/L (ref 0–40)
Albumin: 4.3 g/dL (ref 3.8–4.9)
Alkaline Phosphatase: 87 [IU]/L (ref 44–121)
BUN/Creatinine Ratio: 10 (ref 9–23)
BUN: 9 mg/dL (ref 6–24)
Bilirubin Total: 0.4 mg/dL (ref 0.0–1.2)
CO2: 24 mmol/L (ref 20–29)
Calcium: 9.4 mg/dL (ref 8.7–10.2)
Chloride: 105 mmol/L (ref 96–106)
Creatinine, Ser: 0.87 mg/dL (ref 0.57–1.00)
Globulin, Total: 2.2 g/dL (ref 1.5–4.5)
Glucose: 81 mg/dL (ref 70–99)
Potassium: 4.4 mmol/L (ref 3.5–5.2)
Sodium: 143 mmol/L (ref 134–144)
Total Protein: 6.5 g/dL (ref 6.0–8.5)
eGFR: 77 mL/min/{1.73_m2} (ref >59)

## 2023-07-18 LAB — VITAMIN B1: Thiamine: 122.5 nmol/L (ref 66.5–200.0)

## 2023-07-18 LAB — TSH: TSH: 4.56 u[IU]/mL — ABNORMAL HIGH (ref 0.450–4.500)

## 2023-07-18 LAB — B12 AND FOLATE PANEL
Folate: 4.1 ng/mL (ref >3.0)
Vitamin B-12: 607 pg/mL (ref 232–1245)

## 2023-07-18 LAB — LITHIUM LEVEL: Lithium Lvl: 0.9 mmol/L (ref 0.5–1.2)

## 2023-07-24 ENCOUNTER — Other Ambulatory Visit (HOSPITAL_COMMUNITY): Payer: Self-pay | Admitting: Psychiatry

## 2023-07-24 DIAGNOSIS — F3132 Bipolar disorder, current episode depressed, moderate: Secondary | ICD-10-CM

## 2023-07-25 DIAGNOSIS — F4323 Adjustment disorder with mixed anxiety and depressed mood: Secondary | ICD-10-CM | POA: Diagnosis not present
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
## 2023-08-08 ENCOUNTER — Encounter (HOSPITAL_COMMUNITY): Payer: Self-pay | Admitting: Psychiatry

## 2023-08-08 ENCOUNTER — Telehealth (HOSPITAL_COMMUNITY): Payer: Federal, State, Local not specified - PPO | Admitting: Psychiatry

## 2023-08-08 ENCOUNTER — Other Ambulatory Visit (HOSPITAL_COMMUNITY): Payer: Self-pay | Admitting: Psychiatry

## 2023-08-08 DIAGNOSIS — F3132 Bipolar disorder, current episode depressed, moderate: Secondary | ICD-10-CM

## 2023-08-08 DIAGNOSIS — F411 Generalized anxiety disorder: Secondary | ICD-10-CM

## 2023-08-08 DIAGNOSIS — G2401 Drug induced subacute dyskinesia: Secondary | ICD-10-CM

## 2023-08-08 MED ORDER — LAMOTRIGINE 100 MG PO TABS: 200.0000 mg | ORAL_TABLET | Freq: Every day | ORAL | 2 refills | Status: DC

## 2023-08-09 MED ORDER — BUPROPION HCL ER (XL) 300 MG PO TB24: 300.0000 mg | ORAL_TABLET | ORAL | 2 refills | Status: DC

## 2023-08-09 NOTE — Addendum Note (Signed)
 Addended by: Everlena Cooper on: 08/09/2023 08:26 PM   Modules accepted: Orders

## 2023-08-12 DIAGNOSIS — Z01419 Encounter for gynecological examination (general) (routine) without abnormal findings: Secondary | ICD-10-CM | POA: Diagnosis not present

## 2023-08-12 DIAGNOSIS — Z124 Encounter for screening for malignant neoplasm of cervix: Secondary | ICD-10-CM | POA: Diagnosis not present

## 2023-08-12 DIAGNOSIS — Z1231 Encounter for screening mammogram for malignant neoplasm of breast: Secondary | ICD-10-CM | POA: Diagnosis not present

## 2023-08-12 DIAGNOSIS — Z1151 Encounter for screening for human papillomavirus (HPV): Secondary | ICD-10-CM | POA: Diagnosis not present

## 2023-08-12 LAB — HM MAMMOGRAPHY

## 2023-08-13 DIAGNOSIS — F4323 Adjustment disorder with mixed anxiety and depressed mood: Secondary | ICD-10-CM | POA: Diagnosis not present

## 2023-08-16 LAB — HM PAP SMEAR: HPV, high-risk: NEGATIVE

## 2023-08-19 MED ORDER — VALBENAZINE TOSYLATE 40 MG PO CAPS: 40.0000 mg | ORAL_CAPSULE | Freq: Every day | ORAL | 2 refills | Status: DC

## 2023-08-20 ENCOUNTER — Ambulatory Visit: Payer: Federal, State, Local not specified - PPO | Admitting: Neurology

## 2023-08-28 DIAGNOSIS — F3111 Bipolar disorder, current episode manic without psychotic features, mild: Secondary | ICD-10-CM | POA: Diagnosis not present

## 2023-08-28 DIAGNOSIS — N390 Urinary tract infection, site not specified: Secondary | ICD-10-CM | POA: Diagnosis not present

## 2023-08-28 DIAGNOSIS — R1110 Vomiting, unspecified: Secondary | ICD-10-CM | POA: Diagnosis not present

## 2023-08-30 ENCOUNTER — Other Ambulatory Visit (HOSPITAL_COMMUNITY): Payer: Self-pay | Admitting: Psychiatry

## 2023-08-30 DIAGNOSIS — F310 Bipolar disorder, current episode hypomanic: Secondary | ICD-10-CM

## 2023-08-30 DIAGNOSIS — F411 Generalized anxiety disorder: Secondary | ICD-10-CM
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
## 2023-09-03 ENCOUNTER — Ambulatory Visit (HOSPITAL_COMMUNITY): Admitting: Psychiatry

## 2023-09-03 VITALS — BP 123/77 | HR 104 | Ht 66.0 in | Wt 128.0 lb

## 2023-09-03 DIAGNOSIS — F411 Generalized anxiety disorder: Secondary | ICD-10-CM | POA: Diagnosis not present

## 2023-09-03 DIAGNOSIS — G2401 Drug induced subacute dyskinesia: Secondary | ICD-10-CM

## 2023-09-03 DIAGNOSIS — F3132 Bipolar disorder, current episode depressed, moderate: Secondary | ICD-10-CM | POA: Diagnosis not present

## 2023-09-03 MED ORDER — VALBENAZINE TOSYLATE 40 MG PO CAPS
40.0000 mg | ORAL_CAPSULE | Freq: Every day | ORAL | 2 refills | Status: DC
Start: 2023-09-03 — End: 2023-09-17

## 2023-09-04 ENCOUNTER — Other Ambulatory Visit (HOSPITAL_COMMUNITY): Payer: Self-pay

## 2023-09-04 ENCOUNTER — Encounter (HOSPITAL_COMMUNITY): Payer: Self-pay | Admitting: Psychiatry

## 2023-09-09 ENCOUNTER — Ambulatory Visit: Payer: Federal, State, Local not specified - PPO | Admitting: Family Medicine

## 2023-09-09 NOTE — Progress Notes (Deleted)
   Established Patient Office Visit  Subjective   Patient ID: Christine Bishop, female    DOB: 11-11-1965  Age: 58 y.o. MRN: [id number]  No chief complaint on file.   HPI  Paresthesia finger s and toes.  All 4 limbs.  Clumsinees.     The 10-year ASCVD risk score (Arnett DK, et al., 2019) is: 2.2%  Health Maintenance Due  Topic Date Due   Hepatitis C Screening  Never done   DTaP/Tdap/Td (1 - Tdap) Never done   COVID-19 Vaccine (2 - Pfizer risk series) 11/04/2020   Cervical Cancer Screening (HPV/Pap Cotest)  06/24/2021      Objective:     There were no vitals taken for this visit. {Vitals History (Optional):23777}  Physical Exam   No results found for any visits on 09/09/23.      Assessment & Plan:   There are no diagnoses linked to this encounter.   No follow-ups on file.    Laneta Pintos, MD

## 2023-09-11 DIAGNOSIS — F4323 Adjustment disorder with mixed anxiety and depressed mood: Secondary | ICD-10-CM | POA: Diagnosis not present

## 2023-09-12 ENCOUNTER — Telehealth (HOSPITAL_COMMUNITY): Payer: Self-pay | Admitting: *Deleted

## 2023-09-12 NOTE — Telephone Encounter (Signed)
 PA for Ingrezza  40 mg has been Approved by [employer]. PA valid from 08/12/23 through 09/11/23.

## 2023-09-13 ENCOUNTER — Encounter (HOSPITAL_COMMUNITY): Payer: Self-pay

## 2023-09-17 ENCOUNTER — Encounter: Payer: Self-pay | Admitting: Neurology

## 2023-09-17 ENCOUNTER — Ambulatory Visit: Admitting: Neurology

## 2023-09-17 VITALS — BP 119/77 | HR 102 | Ht 66.0 in | Wt 132.0 lb

## 2023-09-17 DIAGNOSIS — R296 Repeated falls: Secondary | ICD-10-CM | POA: Diagnosis not present

## 2023-09-17 DIAGNOSIS — R292 Abnormal reflex: Secondary | ICD-10-CM

## 2023-09-17 DIAGNOSIS — R2681 Unsteadiness on feet: Secondary | ICD-10-CM | POA: Diagnosis not present

## 2023-09-17 NOTE — Progress Notes (Signed)
 [facility] Clinic Note - Initial Visit   Date: 09/17/2023   Christine Bishop MRN: [id number] DOB: 1966/03/25   Dear Dr. Arabella Beach:  Thank you for your kind referral of Christine Bishop for consultation of numbness/tingling. Although her history is well known to you, please allow us  to reiterate it for the purpose of our medical record. The patient was accompanied to the clinic by self.    Christine Bishop is a 58 y.o. right-handed female with bipolar disorder and GAD presenting for evaluation of numbness/tingling and falls.   IMPRESSION/PLAN: Bilateral hand and feet numbness/tingling with increased falls over the past several week.  Hand symptoms seem to improve with positional changes, suggesting this may be entrapment neuropathy.  Exam shows brisk patella reflexes and normal reflexes in the arms.  Sensation is diffusely intact.  I will order MRI brain and MRI lumbar spine to evaluate for structural pathology.  Additional imaging of the neuroaxis and/or EMG may be indicated, if the above testing is nondiagnostic.   ------------------------------------------------------------- History of present illness: She has numbness/tingling in the hands and feet for the past two years.  Hand symptoms occur every night when she lays down and improves with repositioning.  Feet numbness tends to be more constant.  She also reports having new symptoms of falls over the past 6 weeks.  She has fallen about 20 times, fortunately, no significant injury.  She lightheadedness prior to her falls.   She denies leg weakness or low back pain.  Out-side paper records, electronic medical record, and images have been reviewed where available and summarized as:  MRI brain wwo contrast 09/08/2021: 1. No evidence of acute intracranial abnormality.  2. Mild chronic microvascular ischemic disease.   MRI cervical spine 01/22/2018: 1. No evidence of acute intracranial abnormality.  2. Mild  chronic microvascular ischemic disease.    Lab Results  Component Value Date   HGBA1C 4.9 09/23/2022   Lab Results  Component Value Date   VITAMINB12 607 07/10/2023   Lab Results  Component Value Date   TSH 4.560 (H) 07/10/2023   Lab Results  Component Value Date   ESRSEDRATE 8 09/23/2022   POCTSEDRATE 30 (A) 10/06/2013    Past Medical History:  Diagnosis Date   Anxiety    Dysplastic nevus 12/24/2019   R upper back paraspinal - moderate   Dysplastic nevus 12/24/2019   R to mid upper back 3.0 cm lat to spine above braline - mild   History of migraine headaches    Posterior vitreous detachment of left eye 12/14/2019   Vitreous membranes and strands, left 04/20/2020   Vitrectomy left eye 04-27-20    Past Surgical History:  Procedure Laterality Date   CHOLECYSTECTOMY OPEN     LASIK     LEEP       Medications:  Outpatient Encounter Medications as of 09/17/2023  Medication Sig   buPROPion  (WELLBUTRIN  XL) 300 MG 24 hr tablet Take 1 tablet (300 mg total) by mouth every morning.   COMBIPATCH 0.05-0.14 MG/DAY Apply 1 patch twice a week by transdermal route for 84 days.   ketorolac  (ACULAR ) 0.5 % ophthalmic solution Place 1 drop into the right eye 4 (four) times daily.   lamoTRIgine  (LAMICTAL ) 100 MG tablet Take 2 tablets (200 mg total) by mouth daily. (Patient taking differently: Take 100 mg by mouth daily.)   lithium  300 MG tablet Take 300 mg by mouth once. Take one tablet daily   rosuvastatin  (CRESTOR ) 5 MG tablet  TAKE 1 TABLET (5 MG TOTAL) BY MOUTH DAILY.   tetrabenazine  (XENAZINE ) 12.5 MG tablet Take 2 tablets (25 mg total) by mouth 2 (two) times daily.   traZODone  (DESYREL ) 100 MG tablet Take 1 tablet (100 mg total) by mouth at bedtime.   [DISCONTINUED] Norethindrone-Ethinyl Estradiol-Fe Biphas (LO LOESTRIN FE) 1 MG-10 MCG / 10 MCG tablet Take 1 tablet by mouth daily. (Patient not taking: Reported on 09/17/2023)   [DISCONTINUED] polyethylene glycol (GOLYTELY) 236 g  solution See admin instructions. (Patient not taking: Reported on 09/17/2023)   [DISCONTINUED] valbenazine  (INGREZZA ) 40 MG capsule Take 1 capsule (40 mg total) by mouth daily. (Patient not taking: Reported on 09/17/2023)   No facility-administered encounter medications on file as of 09/17/2023.    Allergies:  Allergies  Allergen Reactions   Inderal [Propranolol] Other (See Comments)    Dizziness, syncopal episodes and "makes me manic"   Septra [Sulfamethoxazole-Trimethoprim] Nausea And Vomiting   Zonegran [Zonisamide] Other (See Comments)    Bipolar/ Mania symptoms    Topamax [Topiramate] Anxiety, Palpitations and Other (See Comments)    Panic attacks, Bi-Polar symptoms    Family History: Family History  Problem Relation Age of Onset   Cancer Mother        Lung cancer   Emphysema Mother    COPD Mother    Hypertension Father    Hyperlipidemia Father    Aneurysm Father        Has them repaired   Heart Problems Father        By pass   Cancer Maternal Grandmother        Bowel/Intestine Cancer    Social History: Social History   Tobacco Use   Smoking status: Former    Current packs/day: 0.00    Types: Cigarettes    Quit date: 06/07/2011    Years since quitting: 12.2    Passive exposure: Past   Smokeless tobacco: Never   Tobacco comments:    No smoking, does not need nicotine patch or gum  Vaping Use   Vaping status: Never Used  Substance Use Topics   Alcohol use: No   Drug use: No   Social History   Social History Narrative   Are you right handed or left handed? Right Handed   Are you currently employed ? No    What is your current occupation? Retired from [employer] you live at home alone? No    Who lives with you? Husband, daughter and son in law    What type of home do you live in: 1 story or 2 story? Lives in a two story home        Vital Signs:  BP 119/77   Pulse (!) 102   Ht 5\' 6"  (1.676 m)   Wt 132 lb (59.9 kg)   SpO2 96%   BMI 21.31 kg/m    Neurological Exam: MENTAL STATUS including orientation to time, place, person, recent and remote memory, attention span and concentration, language, and fund of knowledge is fairly intact.  Speech is not dysarthric.  CRANIAL NERVES: II:  No visual field defects.     III-IV-VI: Pupils equal round and reactive to light.  Normal conjugate, extra-ocular eye movements in all directions of gaze.  No nystagmus.  No ptosis.   V:  Normal facial sensation.    VII:  Normal facial symmetry and movements.   VIII:  Normal hearing and vestibular function.   IX-X:  Normal palatal movement.  XI:  Normal shoulder shrug and head rotation.   XII:  Normal tongue strength and range of motion, no deviation or fasciculation.  MOTOR:  Bilateral postural hand tremor.  No atrophy or fasciculations. No pronator drift.   Upper Extremity:  Right  Left  Deltoid  5/5   5/5   Biceps  5/5   5/5   Triceps  5/5   5/5   Wrist extensors  5/5   5/5   Wrist flexors  5/5   5/5   Finger extensors  5/5   5/5   Finger flexors  5/5   5/5   Dorsal interossei  5/5   5/5   Tone (Ashworth scale)  0  0   Lower Extremity:  Right  Left  Hip flexors  5/5   5/5   Knee flexors  5/5   5/5   Knee extensors  5/5   5/5   Dorsiflexors  5/5   5/5   Plantarflexors  5/5   5/5   Toe extensors  5/5   5/5   Toe flexors  5/5   5/5   Tone (Ashworth scale)  0  0   MSRs:                                           Right        Left brachioradialis 2+  2+  biceps 2+  2+  triceps 2+  2+  patellar 3+  3+  ankle jerk 1+  1+  Hoffman no  no  plantar response down  up   SENSORY:  Normal and symmetric perception of light touch, pinprick, vibration, and pin prick.  Romberg's sign positive.   COORDINATION/GAIT: Dysmetria with finger-to- nose-finger bilaterally.  Intact rapid alternating movements bilaterally.  Gait appears ataxic, unassisted.      Thank you for allowing me to participate in patient's care.  If I can answer any additional  questions, I would be pleased to do so.    Sincerely,    Kebin Maye K. Lydia Sams, DO

## 2023-09-17 NOTE — Telephone Encounter (Signed)
 Ingrezza  has been approved.

## 2023-09-18 NOTE — Telephone Encounter (Signed)
 Per the pharmacy, it is approved and the copay is 200. I called and let patient know and she was okay with the copay price and will reach out to [place] to arrange shipment to her home.

## 2023-09-21 ENCOUNTER — Ambulatory Visit
Admission: RE | Admit: 2023-09-21 | Discharge: 2023-09-21 | Disposition: A | Discharge status: Home / Self Care | Source: Ambulatory Visit | Attending: Neurology | Admitting: Neurology

## 2023-09-21 DIAGNOSIS — R2681 Unsteadiness on feet: Secondary | ICD-10-CM

## 2023-09-21 DIAGNOSIS — R292 Abnormal reflex: Secondary | ICD-10-CM

## 2023-09-21 DIAGNOSIS — M5126 Other intervertebral disc displacement, lumbar region: Secondary | ICD-10-CM | POA: Diagnosis not present

## 2023-09-21 DIAGNOSIS — R296 Repeated falls: Secondary | ICD-10-CM

## 2023-09-21 DIAGNOSIS — I6782 Cerebral ischemia: Secondary | ICD-10-CM | POA: Diagnosis not present
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
## 2023-09-24 ENCOUNTER — Ambulatory Visit (HOSPITAL_COMMUNITY): Admitting: Psychiatry

## 2023-09-24 ENCOUNTER — Encounter (HOSPITAL_COMMUNITY): Payer: Self-pay | Admitting: Psychiatry

## 2023-09-24 VITALS — BP 123/78 | HR 88 | Ht 66.0 in | Wt 132.0 lb

## 2023-09-24 DIAGNOSIS — F3132 Bipolar disorder, current episode depressed, moderate: Secondary | ICD-10-CM

## 2023-09-24 DIAGNOSIS — F411 Generalized anxiety disorder: Secondary | ICD-10-CM | POA: Diagnosis not present

## 2023-09-24 DIAGNOSIS — G2401 Drug induced subacute dyskinesia: Secondary | ICD-10-CM | POA: Diagnosis not present

## 2023-09-24 MED ORDER — INGREZZA 40 MG PO CPSP: 40.0000 mg | ORAL_CAPSULE | Freq: Every day | ORAL | 0 refills | Status: DC

## 2023-09-24 MED ORDER — TRAZODONE HCL 100 MG PO TABS: 100.0000 mg | ORAL_TABLET | Freq: Every day | ORAL | 0 refills | Status: DC

## 2023-09-24 MED ORDER — LITHIUM CARBONATE 300 MG PO TABS: 300.0000 mg | ORAL_TABLET | Freq: Once | ORAL | 2 refills | Status: DC

## 2023-09-25 ENCOUNTER — Encounter: Payer: Self-pay | Admitting: Family Medicine

## 2023-09-25 ENCOUNTER — Ambulatory Visit: Admitting: Family Medicine

## 2023-09-25 ENCOUNTER — Encounter (HOSPITAL_COMMUNITY): Payer: Self-pay

## 2023-09-25 VITALS — BP 126/81 | HR 92 | Ht 66.0 in | Wt 132.3 lb

## 2023-09-25 DIAGNOSIS — R202 Paresthesia of skin: Secondary | ICD-10-CM | POA: Diagnosis not present

## 2023-09-25 DIAGNOSIS — F3132 Bipolar disorder, current episode depressed, moderate: Secondary | ICD-10-CM | POA: Diagnosis not present

## 2023-09-25 DIAGNOSIS — R296 Repeated falls: Secondary | ICD-10-CM

## 2023-09-25 DIAGNOSIS — F3164 Bipolar disorder, current episode mixed, severe, with psychotic features: Secondary | ICD-10-CM

## 2023-09-25 NOTE — Progress Notes (Signed)
   Established Patient Office Visit  Subjective   Patient ID: Christine Bishop, female    DOB: 05-Jan-1966  Age: 58 y.o. MRN: [id number]  No chief complaint on file.   HPI  Subjective - Multiple falls (approximately 10) with weakness, especially on left side, and balance issues - States psychiatrist suggested conversion disorder -Has bruising of the left arm.  No other injuries. - Medications: lithium , lamictal , trazodone , wellbutrin , Ingrezza  - Recent MRI of head completed, results pending - Reports waking up last Friday with resolution of balance problems from last visit  - Continues to have some tingling in hands and feet bilaterally  - Impacted ear wax in both ears  Medications: lithium , lamictal , trazodone , wellbutrin , greza  PMH: thyroid  disorder (minimally elevated TSH <5)  ROS: Denies current balance issues, reports bilateral paresthesia in feet, denies numbness/tingling/weakness in arm with bruising   The 10-year ASCVD risk score (Arnett DK, et al., 2019) is: 2.3%  Health Maintenance Due  Topic Date Due   Hepatitis C Screening  Never done   DTaP/Tdap/Td (1 - Tdap) Never done   COVID-19 Vaccine (2 - Pfizer risk series) 11/04/2020   Cervical Cancer Screening (HPV/Pap Cotest)  06/24/2021      Objective:     BP 126/81   Pulse 92   Ht 5\' 6"  (1.676 m)   Wt 132 lb 4.8 oz (60 kg)   SpO2 98%   BMI 21.35 kg/m    Physical Exam General: Alert, oriented HEENT: Impacted cerumen bilaterally.  After removal, normal tympanic membranes visualized CV: Rate rhythm Pulmonary: No scar bilaterally Skin: Purple/green bruising on the left upper arm near the elbow.   No results found for any visits on 09/25/23.      Assessment & Plan:   Paresthesia Assessment & Plan: Has seen neurology recently.  Has had recent MRI of brain and lumbar spine.  Results of MRI pending.  Lab testing has been normal.   Falls Assessment & Plan: Patient endorsed multiple falls and  balance issues since the last time I saw her.  Has bruising on her left arm but no other injuries.  Patient is the one who mentioned possibility of conversion disorder.  States these issues have resolved over the past week.  No loss of consciousness during any of these episodes reportedly.  Hard to say what caused it.  She does have a significant psychiatric disorder but also is on several medications to treat this, most of which could cause balance issues as a potential side effect.  Medications managed by her psychiatrist.  Continue to monitor.   Bipolar I disorder, most recent episode mixed, severe with psychotic features (HCC) Assessment & Plan: Most recent medications include Lithium  300 mg nightly, Lamictal  200 mg daily, trazodone  100 mg nightly as needed, Wellbutrin  300 daily, Ingrezza  40 mg daily for tardive dyskinesia   Bipolar affective disorder, currently depressed, moderate (HCC) -     Lithium  Carbonate; Take 1 tablet (300 mg total) by mouth at bedtime. Take one tablet daily  I spent 33 minutes in the management of the patient.   No follow-ups on file.    Laneta Pintos, MD

## 2023-09-25 NOTE — Patient Instructions (Signed)
 It was nice to see you today,  We addressed the following topics today: - no changes to your medications today.  We will repeat labs prior to your physical.    Have a great day,  Etha Henle, MD

## 2023-09-27 DIAGNOSIS — R296 Repeated falls: Secondary | ICD-10-CM | POA: Insufficient documentation

## 2023-09-27 MED ORDER — LITHIUM CARBONATE 300 MG PO TABS: 300.0000 mg | ORAL_TABLET | Freq: Every evening | ORAL | Status: DC

## 2023-09-27 NOTE — Assessment & Plan Note (Signed)
 Patient endorsed multiple falls and balance issues since the last time I saw her.  Has bruising on her left arm but no other injuries.  Patient is the one who mentioned possibility of conversion disorder.  States these issues have resolved over the past week.  No loss of consciousness during any of these episodes reportedly.  Hard to say what caused it.  She does have a significant psychiatric disorder but also is on several medications to treat this, most of which could cause balance issues as a potential side effect.  Medications managed by her psychiatrist.  Continue to monitor.

## 2023-09-27 NOTE — Assessment & Plan Note (Signed)
>>  ASSESSMENT AND PLAN FOR PARESTHESIA WRITTEN ON 09/27/2023  7:05 AM BY Alyona Romack K, MD  Has seen neurology recently.  Has had recent MRI of brain and lumbar spine.  Results of MRI pending.  Lab testing has been normal.

## 2023-09-27 NOTE — Assessment & Plan Note (Signed)
 Most recent medications include Lithium  300 mg nightly, Lamictal  200 mg daily, trazodone  100 mg nightly as needed, Wellbutrin  300 daily, Ingrezza  40 mg daily for tardive dyskinesia

## 2023-09-27 NOTE — Assessment & Plan Note (Signed)
 Has seen neurology recently.  Has had recent MRI of brain and lumbar spine.  Results of MRI pending.  Lab testing has been normal.

## 2023-10-01 ENCOUNTER — Ambulatory Visit: Payer: Self-pay | Admitting: Neurology

## 2023-10-03 ENCOUNTER — Other Ambulatory Visit (HOSPITAL_COMMUNITY): Payer: Self-pay | Admitting: Psychiatry

## 2023-10-03 DIAGNOSIS — F3132 Bipolar disorder, current episode depressed, moderate: Secondary | ICD-10-CM

## 2023-10-10 DIAGNOSIS — F4323 Adjustment disorder with mixed anxiety and depressed mood: Secondary | ICD-10-CM | POA: Diagnosis not present

## 2023-10-24 ENCOUNTER — Other Ambulatory Visit: Payer: Self-pay | Admitting: Family Medicine
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
## 2023-11-05 ENCOUNTER — Encounter (HOSPITAL_COMMUNITY): Payer: Self-pay | Admitting: Psychiatry

## 2023-11-05 ENCOUNTER — Ambulatory Visit (HOSPITAL_COMMUNITY): Admitting: Psychiatry

## 2023-11-05 ENCOUNTER — Other Ambulatory Visit: Payer: Self-pay

## 2023-11-05 VITALS — BP 115/75 | HR 105 | Ht 66.0 in | Wt 133.0 lb

## 2023-11-05 DIAGNOSIS — F411 Generalized anxiety disorder: Secondary | ICD-10-CM | POA: Diagnosis not present

## 2023-11-05 DIAGNOSIS — G2401 Drug induced subacute dyskinesia: Secondary | ICD-10-CM | POA: Diagnosis not present

## 2023-11-05 DIAGNOSIS — F3132 Bipolar disorder, current episode depressed, moderate: Secondary | ICD-10-CM | POA: Diagnosis not present

## 2023-11-05 MED ORDER — VALBENAZINE TOSYLATE 60 MG PO CAPS
60.0000 mg | ORAL_CAPSULE | Freq: Every day | ORAL | 0 refills | Status: DC
Start: 2023-11-05 — End: 2023-12-24

## 2023-11-05 MED ORDER — BUPROPION HCL ER (XL) 300 MG PO TB24: 300.0000 mg | ORAL_TABLET | ORAL | 0 refills | Status: DC

## 2023-11-06 DIAGNOSIS — F4323 Adjustment disorder with mixed anxiety and depressed mood: Secondary | ICD-10-CM | POA: Diagnosis not present

## 2023-11-10 ENCOUNTER — Other Ambulatory Visit (HOSPITAL_COMMUNITY): Payer: Self-pay | Admitting: Psychiatry

## 2023-11-10 DIAGNOSIS — F411 Generalized anxiety disorder: Secondary | ICD-10-CM

## 2023-11-12 DIAGNOSIS — H43312 Vitreous membranes and strands, left eye: Secondary | ICD-10-CM | POA: Diagnosis not present

## 2023-11-12 DIAGNOSIS — H35372 Puckering of macula, left eye: Secondary | ICD-10-CM | POA: Diagnosis not present

## 2023-11-12 DIAGNOSIS — H35351 Cystoid macular degeneration, right eye: Secondary | ICD-10-CM | POA: Diagnosis not present

## 2023-11-12 DIAGNOSIS — H35371 Puckering of macula, right eye: Secondary | ICD-10-CM | POA: Diagnosis not present

## 2023-11-13 ENCOUNTER — Encounter: Payer: Self-pay | Admitting: Family Medicine

## 2023-11-19 ENCOUNTER — Other Ambulatory Visit (HOSPITAL_COMMUNITY): Payer: Self-pay | Admitting: Psychiatry

## 2023-11-19 DIAGNOSIS — F411 Generalized anxiety disorder: Secondary | ICD-10-CM

## 2023-11-20 ENCOUNTER — Telehealth: Payer: Self-pay | Admitting: *Deleted

## 2023-11-20 NOTE — Telephone Encounter (Signed)
 LVM informing pt that we will need to reschedule her physical due to the provider being out of office.  I left the lab visit to see if we can get the physical scheduled close to them if not then we will cancel that and schedule the week before the physical.

## 2023-11-21 ENCOUNTER — Telehealth: Payer: Self-pay | Admitting: Neurology

## 2023-11-21 DIAGNOSIS — R2681 Unsteadiness on feet: Secondary | ICD-10-CM

## 2023-11-21 DIAGNOSIS — R296 Repeated falls: Secondary | ICD-10-CM

## 2023-11-21 DIAGNOSIS — R292 Abnormal reflex: Secondary | ICD-10-CM

## 2023-11-21 NOTE — Telephone Encounter (Signed)
 MRI cervical and MRI Thoracic has been sent to [facility].   Called patient and informed her that MRI's have been sent. Patient verbalized understanding and is aware [facility] will contact them once PA is completed.

## 2023-11-21 NOTE — Telephone Encounter (Signed)
 Patient wants to move forward with the MRI

## 2023-11-30 ENCOUNTER — Other Ambulatory Visit

## 2023-12-02 DIAGNOSIS — F4323 Adjustment disorder with mixed anxiety and depressed mood: Secondary | ICD-10-CM | POA: Diagnosis not present

## 2023-12-03 ENCOUNTER — Other Ambulatory Visit

## 2023-12-10 ENCOUNTER — Other Ambulatory Visit: Payer: Federal, State, Local not specified - PPO

## 2023-12-10 ENCOUNTER — Other Ambulatory Visit (HOSPITAL_COMMUNITY): Payer: Self-pay | Admitting: Psychiatry

## 2023-12-10 DIAGNOSIS — F3132 Bipolar disorder, current episode depressed, moderate: Secondary | ICD-10-CM

## 2023-12-17 ENCOUNTER — Other Ambulatory Visit (HOSPITAL_COMMUNITY): Payer: Self-pay | Admitting: Psychiatry

## 2023-12-17 ENCOUNTER — Ambulatory Visit: Payer: Federal, State, Local not specified - PPO | Admitting: Family Medicine

## 2023-12-17 DIAGNOSIS — F3132 Bipolar disorder, current episode depressed, moderate: Secondary | ICD-10-CM
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
## 2023-12-24 ENCOUNTER — Ambulatory Visit (HOSPITAL_COMMUNITY): Admitting: Psychiatry

## 2023-12-24 ENCOUNTER — Encounter (HOSPITAL_COMMUNITY): Payer: Self-pay | Admitting: Psychiatry

## 2023-12-24 DIAGNOSIS — F411 Generalized anxiety disorder: Secondary | ICD-10-CM

## 2023-12-24 DIAGNOSIS — F3132 Bipolar disorder, current episode depressed, moderate: Secondary | ICD-10-CM

## 2023-12-24 DIAGNOSIS — G2401 Drug induced subacute dyskinesia: Secondary | ICD-10-CM

## 2023-12-24 MED ORDER — BUPROPION HCL ER (XL) 300 MG PO TB24: 300.0000 mg | ORAL_TABLET | ORAL | 0 refills | Status: DC

## 2023-12-24 MED ORDER — VALBENAZINE TOSYLATE 80 MG PO CAPS: 80.0000 mg | ORAL_CAPSULE | Freq: Every day | ORAL | 2 refills | Status: DC

## 2023-12-24 MED ORDER — LAMOTRIGINE 100 MG PO TABS: 200.0000 mg | ORAL_TABLET | Freq: Every day | ORAL | 0 refills | Status: DC

## 2024-01-05 DIAGNOSIS — H5712 Ocular pain, left eye: Secondary | ICD-10-CM | POA: Diagnosis not present

## 2024-01-05 DIAGNOSIS — H10023 Other mucopurulent conjunctivitis, bilateral: Secondary | ICD-10-CM | POA: Diagnosis not present

## 2024-01-06 ENCOUNTER — Other Ambulatory Visit

## 2024-01-06 ENCOUNTER — Other Ambulatory Visit: Payer: Self-pay | Admitting: *Deleted

## 2024-01-06 DIAGNOSIS — E782 Mixed hyperlipidemia: Secondary | ICD-10-CM

## 2024-01-06 DIAGNOSIS — Z131 Encounter for screening for diabetes mellitus: Secondary | ICD-10-CM

## 2024-01-07 ENCOUNTER — Ambulatory Visit: Payer: Self-pay | Admitting: Family Medicine

## 2024-01-07 LAB — COMPREHENSIVE METABOLIC PANEL WITH GFR
ALT: 15 IU/L (ref 0–32)
AST: 16 IU/L (ref 0–40)
Albumin: 4.5 g/dL (ref 3.8–4.9)
Alkaline Phosphatase: 93 IU/L (ref 44–121)
BUN/Creatinine Ratio: 16 (ref 9–23)
BUN: 12 mg/dL (ref 6–24)
Bilirubin Total: 0.5 mg/dL (ref 0.0–1.2)
CO2: 21 mmol/L (ref 20–29)
Calcium: 9.6 mg/dL (ref 8.7–10.2)
Chloride: 107 mmol/L — ABNORMAL HIGH (ref 96–106)
Creatinine, Ser: 0.76 mg/dL (ref 0.57–1.00)
Globulin, Total: 2 g/dL (ref 1.5–4.5)
Glucose: 81 mg/dL (ref 70–99)
Potassium: 4.3 mmol/L (ref 3.5–5.2)
Sodium: 142 mmol/L (ref 134–144)
Total Protein: 6.5 g/dL (ref 6.0–8.5)
eGFR: 91 mL/min/1.73 (ref >59)

## 2024-01-07 LAB — CBC WITH DIFFERENTIAL/PLATELET
Basophils Absolute: 0 x10E3/uL (ref 0.0–0.2)
Basos: 1 %
EOS (ABSOLUTE): 0.1 x10E3/uL (ref 0.0–0.4)
Eos: 2 %
Hematocrit: 42.6 % (ref 34.0–46.6)
Hemoglobin: 13.9 g/dL (ref 11.1–15.9)
Immature Grans (Abs): 0 x10E3/uL (ref 0.0–0.1)
Immature Granulocytes: 0 %
Lymphocytes Absolute: 0.9 x10E3/uL (ref 0.7–3.1)
Lymphs: 18 %
MCH: 32.3 pg (ref 26.6–33.0)
MCHC: 32.6 g/dL (ref 31.5–35.7)
MCV: 99 fL — ABNORMAL HIGH (ref 79–97)
Monocytes Absolute: 0.4 x10E3/uL (ref 0.1–0.9)
Monocytes: 9 %
Neutrophils Absolute: 3.4 x10E3/uL (ref 1.4–7.0)
Neutrophils: 70 %
Platelets: 188 x10E3/uL (ref 150–450)
RBC: 4.31 x10E6/uL (ref 3.77–5.28)
RDW: 11.2 % — ABNORMAL LOW (ref 11.7–15.4)
WBC: 4.8 x10E3/uL (ref 3.4–10.8)

## 2024-01-07 LAB — HEMOGLOBIN A1C
Est. average glucose Bld gHb Est-mCnc: 88 mg/dL
Hgb A1c MFr Bld: 4.7 % — ABNORMAL LOW (ref 4.8–5.6)

## 2024-01-07 LAB — LIPID PANEL
Chol/HDL Ratio: 3 ratio (ref 0.0–4.4)
Cholesterol, Total: 165 mg/dL (ref 100–199)
HDL: 55 mg/dL (ref >39)
LDL Chol Calc (NIH): 89 mg/dL (ref 0–99)
Triglycerides: 115 mg/dL (ref 0–149)
VLDL Cholesterol Cal: 21 mg/dL (ref 5–40)

## 2024-01-07 LAB — TSH: TSH: 2.26 u[IU]/mL (ref 0.450–4.500)

## 2024-01-14 ENCOUNTER — Encounter: Payer: Self-pay | Admitting: Family Medicine

## 2024-01-14 ENCOUNTER — Ambulatory Visit (INDEPENDENT_AMBULATORY_CARE_PROVIDER_SITE_OTHER): Admitting: Family Medicine

## 2024-01-14 VITALS — BP 104/69 | HR 86 | Ht 66.0 in | Wt 140.0 lb

## 2024-01-14 DIAGNOSIS — Z0000 Encounter for general adult medical examination without abnormal findings: Secondary | ICD-10-CM | POA: Diagnosis not present

## 2024-01-14 DIAGNOSIS — E782 Mixed hyperlipidemia: Secondary | ICD-10-CM

## 2024-01-14 DIAGNOSIS — G629 Polyneuropathy, unspecified: Secondary | ICD-10-CM

## 2024-01-14 DIAGNOSIS — Z23 Encounter for immunization: Secondary | ICD-10-CM | POA: Diagnosis not present

## 2024-01-14 DIAGNOSIS — H5789 Other specified disorders of eye and adnexa: Secondary | ICD-10-CM | POA: Diagnosis not present

## 2024-01-14 MED ORDER — ROSUVASTATIN CALCIUM 5 MG PO TABS
5.0000 mg | ORAL_TABLET | Freq: Every day | ORAL | 3 refills | Status: AC
Start: 2024-01-14 — End: ?

## 2024-01-14 NOTE — Progress Notes (Unsigned)
 Annual physical  Subjective    Patient ID: Christine Bishop, female    DOB: [birth year]  Age: 58 y.o. MRN: [id number]  No chief complaint on file.  HPI Christine Bishop is a 58 y.o. old female here  for annual exam.   Subjective - Eye irritation, present for approximately 9 days. Initially treated at an urgent care for presumed conjunctivitis with antibiotic eye drops. Reports no improvement after 7 days of treatment and has continued the drops. Symptoms started in the left eye and moved to the right. Describes itching and eyes being matted shut upon waking with some discharge. Denies significant redness unless rubbing the eyes. No known sick contacts. Denies seasonal allergies or associated sneezing.  - Tingling in hands and legs is ongoing but stable. Reports previous falls have resolved.  - Requesting Tdap and pneumonia vaccines.  - Requesting refill for Crestor .  Medications Current medications include polymyxin B eye drops every 4 hours, ketorolac  1 drop daily in the right eye. Past medication includes a combination patch, which has been discontinued.  PMH, PSH, FH, Social Hx PMHx: High cholesterol. History of falls (resolved). Peripheral paresthesias. PSHx: History of eye surgeries. Social Hx: Denies tobacco or alcohol use. Reports infrequent exercise. No specific dietary restrictions. Pap smear was performed in March or April of this year at [facility], results normal. Last mammogram was also in March or April.  ROS Constitutional: Denies fever. Allergy/Immunology: Denies seasonal allergies. Eyes: Reports itching, irritation, and morning discharge with crusting. Denies significant redness. Neurological: Reports ongoing tingling in hands and legs.  Objective General: No acute distress. HEENT: Eyes: EOMI. No conjunctival injection noted. No obvious signs of active conjunctivitis.  Assessment and Plan 1. Eye Irritation/Itching History of presumed conjunctivitis treated with  antibiotic eye drops without relief. Current examination does not show signs of active conjunctivitis. Symptoms are suggestive of possible allergic etiology or clogged tear ducts. - Discontinue antibiotic eye drops. - Recommended warm compresses and gentle massage over the tear ducts. - Advised on trial of OTC Pataday eye drops twice daily for possible allergic component. - Advised on trial of OTC Flonase  nasal spray to decrease regional inflammation. - Counseled that if symptoms do not improve in one week, to follow up with 58 their ophthalmologist for further evaluation, including for possible tear duct stenosis. Assured that they are not considered contagious at this time.  2. Health Maintenance Due for vaccinations. Labs from recent visit reviewed and were reassuring. No concerns with cholesterol, thyroid , or A1C. - Tdap vaccine administered. - Pneumonia vaccine administered. - Continue annual follow-ups.  3. Hyperlipidemia Stable on current therapy. - Refill for Crestor  sent to pharmacy.  4. Peripheral Paresthesias Chronic, stable tingling in hands and feet. - Continue to monitor.   The 10-year ASCVD risk score (Arnett DK, et al., 2019) is: 2%  Health Maintenance Due  Topic Date Due   Hepatitis C Screening  Never done   DTaP/Tdap/Td (1 - Tdap) Never done   Hepatitis B Vaccines 19-59 Average Risk (1 of 3 - 19+ 3-dose series) Never done   Pneumococcal Vaccine: 50+ Years (1 of 1 - PCV) Never done   COVID-19 Vaccine (2 - Pfizer risk series) 11/04/2020   Cervical Cancer Screening (HPV/Pap Cotest)  06/24/2021   INFLUENZA VACCINE  12/20/2023      Objective:     There were no vitals taken for this visit. {Vitals History (Optional):23777}  Physical Exam   No results found for any visits on 01/14/24.  Assessment & Plan:   There are no diagnoses linked to this encounter.   No follow-ups on file.    Toribio MARLA Slain, MD

## 2024-01-14 NOTE — Patient Instructions (Addendum)
 It was nice to see you today,  We addressed the following topics today: -Your eye appears okay to me.  It does not look like conjunctivitis.  It may be a clogged tear duct that is causing your symptoms in the morning.  You can use hot compresses, gentle massage, and try over-the-counter options like Pataday eyedrops or Flonase  nasal spray. - If you are not getting improvement with this I would reach out to your eye doctor regarding other treatment options. - Overall your labs look good.  Nothing concerning.  We will recheck them next year.   Have a great day,  Rolan Slain, MD

## 2024-01-15 DIAGNOSIS — Z0000 Encounter for general adult medical examination without abnormal findings: Secondary | ICD-10-CM | POA: Insufficient documentation

## 2024-01-15 DIAGNOSIS — H5789 Other specified disorders of eye and adnexa: Secondary | ICD-10-CM | POA: Insufficient documentation

## 2024-01-15 NOTE — Assessment & Plan Note (Signed)
 History of presumed conjunctivitis treated with antibiotic eye drops without relief. Current examination does not show signs of active conjunctivitis. Symptoms are suggestive of possible allergic etiology or clogged tear ducts. - Discontinue antibiotic eye drops. - Recommended warm compresses and gentle massage over the tear ducts. - Advised on trial of OTC Pataday eye drops twice daily for possible allergic component. - Advised on trial of OTC Flonase  nasal spray to decrease regional inflammation. - Counseled that if symptoms do not improve in the next week, to follow up with their ophthalmologist for further evaluation,

## 2024-01-15 NOTE — Assessment & Plan Note (Signed)
 Due for vaccinations. Labs from recent visit reviewed and were reassuring. No concerns with cholesterol, thyroid , or A1C. - Tdap vaccine administered. - Pneumonia vaccine administered. - Continue annual follow-ups.

## 2024-01-15 NOTE — Assessment & Plan Note (Signed)
 Stable on current therapy. - Refill for Crestor  sent to pharmacy.

## 2024-01-15 NOTE — Assessment & Plan Note (Addendum)
 Chronic, stable tingling in hands and feet. Extensive workup so far has been negative.  - Continue to monitor.

## 2024-01-25 ENCOUNTER — Encounter (HOSPITAL_COMMUNITY): Payer: Self-pay

## 2024-01-25 DIAGNOSIS — G2401 Drug induced subacute dyskinesia: Secondary | ICD-10-CM

## 2024-01-26 MED ORDER — VALBENAZINE TOSYLATE 80 MG PO CAPS: 80.0000 mg | ORAL_CAPSULE | Freq: Every day | ORAL | 2 refills | Status: DC

## 2024-01-27 DIAGNOSIS — F4323 Adjustment disorder with mixed anxiety and depressed mood: Secondary | ICD-10-CM | POA: Diagnosis not present

## 2024-02-15 ENCOUNTER — Other Ambulatory Visit (HOSPITAL_COMMUNITY): Payer: Self-pay | Admitting: Psychiatry

## 2024-02-15 DIAGNOSIS — F411 Generalized anxiety disorder: Secondary | ICD-10-CM

## 2024-02-19 ENCOUNTER — Other Ambulatory Visit: Payer: Self-pay | Admitting: Dermatology

## 2024-02-19 ENCOUNTER — Ambulatory Visit: Payer: Federal, State, Local not specified - PPO | Admitting: Dermatology

## 2024-02-19 DIAGNOSIS — Z1283 Encounter for screening for malignant neoplasm of skin: Secondary | ICD-10-CM

## 2024-02-19 DIAGNOSIS — D492 Neoplasm of unspecified behavior of bone, soft tissue, and skin: Secondary | ICD-10-CM | POA: Diagnosis not present

## 2024-02-19 DIAGNOSIS — D235 Other benign neoplasm of skin of trunk: Secondary | ICD-10-CM

## 2024-02-19 DIAGNOSIS — D225 Melanocytic nevi of trunk: Secondary | ICD-10-CM

## 2024-02-19 DIAGNOSIS — L821 Other seborrheic keratosis: Secondary | ICD-10-CM

## 2024-02-19 DIAGNOSIS — W908XXA Exposure to other nonionizing radiation, initial encounter: Secondary | ICD-10-CM

## 2024-02-19 DIAGNOSIS — L814 Other melanin hyperpigmentation: Secondary | ICD-10-CM

## 2024-02-19 DIAGNOSIS — L578 Other skin changes due to chronic exposure to nonionizing radiation: Secondary | ICD-10-CM | POA: Diagnosis not present

## 2024-02-19 DIAGNOSIS — D229 Melanocytic nevi, unspecified: Secondary | ICD-10-CM

## 2024-02-19 DIAGNOSIS — Z86018 Personal history of other benign neoplasm: Secondary | ICD-10-CM

## 2024-02-19 DIAGNOSIS — L905 Scar conditions and fibrosis of skin: Secondary | ICD-10-CM
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
## 2024-02-19 NOTE — Patient Instructions (Addendum)

## 2024-02-20 ENCOUNTER — Encounter: Payer: Self-pay | Admitting: Dermatology

## 2024-02-21 LAB — DERMATOLOGY PATHOLOGY

## 2024-02-22 ENCOUNTER — Ambulatory Visit: Payer: Self-pay | Admitting: Dermatology

## 2024-02-24 ENCOUNTER — Encounter: Payer: Self-pay | Admitting: Dermatology

## 2024-02-24 NOTE — Telephone Encounter (Addendum)
 Called and discussed bx results with patient. She verbalized understanding and denied further questions. Patient was made a 6 month appt for follow up may need additional procedure at left low back 4 cm lateral to spine will recheck at next follow up.  ----- Message from Alm Rhyme sent at 02/22/2024  1:54 PM EDT ----- FINAL DIAGNOSIS        1. Skin, R upper back paraspinal :       DYSPLASTIC JUNCTIONAL LENTIGINOUS NEVUS WITH MODERATE ATYPIA, CLOSE TO MARGIN        2. Skin, left low back 4 cm lateral to spine :       DYSPLASTIC NEVUS WITH MODERATE TO SEVERE ATYPIA, CLOSE TO MARGIN, SEE       DESCRIPTION        3. Skin, left low back above waistline 10 cm lat to spine :       DYSPLASTIC JUNCTIONAL NEVUS WITH MODERATE ATYPIA, PERIPHERAL MARGIN INVOLVED   1&3 - Both Moderate Dysplastic Recheck next visit 2- Moderate to Severe Dysplastic Margins clear, but CLOSE TO MARGIN May need additional procedure Re-check 6 mos Make pt appt in 6 mos ----- Message ----- From: Interface, Lab In Three Zero One Sent: 02/21/2024   3:42 PM EDT To: Alm JAYSON Rhyme, MD

## 2024-02-28 DIAGNOSIS — N390 Urinary tract infection, site not specified: Secondary | ICD-10-CM | POA: Diagnosis not present
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
## 2024-03-03 ENCOUNTER — Encounter (HOSPITAL_COMMUNITY): Payer: Self-pay | Admitting: Psychiatry

## 2024-03-03 ENCOUNTER — Ambulatory Visit (HOSPITAL_COMMUNITY): Admitting: Psychiatry

## 2024-03-03 ENCOUNTER — Other Ambulatory Visit: Payer: Self-pay

## 2024-03-03 VITALS — BP 112/72 | HR 80 | Ht 66.0 in | Wt 138.0 lb

## 2024-03-03 DIAGNOSIS — G2401 Drug induced subacute dyskinesia: Secondary | ICD-10-CM

## 2024-03-03 DIAGNOSIS — F411 Generalized anxiety disorder: Secondary | ICD-10-CM

## 2024-03-03 DIAGNOSIS — F3132 Bipolar disorder, current episode depressed, moderate: Secondary | ICD-10-CM

## 2024-03-03 MED ORDER — BUPROPION HCL ER (XL) 300 MG PO TB24: 300.0000 mg | ORAL_TABLET | ORAL | 0 refills | Status: DC

## 2024-03-03 MED ORDER — VALBENAZINE TOSYLATE 80 MG PO CAPS: 80.0000 mg | ORAL_CAPSULE | Freq: Every day | ORAL | 2 refills | Status: DC

## 2024-03-03 MED ORDER — LAMOTRIGINE 100 MG PO TABS: 200.0000 mg | ORAL_TABLET | Freq: Every day | ORAL | 0 refills | Status: DC

## 2024-03-18 ENCOUNTER — Other Ambulatory Visit (HOSPITAL_COMMUNITY): Payer: Self-pay | Admitting: Psychiatry

## 2024-03-18 DIAGNOSIS — F411 Generalized anxiety disorder: Secondary | ICD-10-CM

## 2024-03-24 ENCOUNTER — Telehealth: Admitting: Family Medicine

## 2024-03-24 NOTE — Progress Notes (Signed)
  [place]  Needs to follow up with UC that did UA/C&S to get additional testing, or  treatment based on results. This was done outside the system- so unable to treat or verify testing.  Patient acknowledged agreement and understanding of the plan.

## 2024-03-25 DIAGNOSIS — F3111 Bipolar disorder, current episode manic without psychotic features, mild: Secondary | ICD-10-CM | POA: Diagnosis not present

## 2024-03-25 DIAGNOSIS — N390 Urinary tract infection, site not specified: Secondary | ICD-10-CM | POA: Diagnosis not present

## 2024-03-25 DIAGNOSIS — N3001 Acute cystitis with hematuria: Secondary | ICD-10-CM | POA: Diagnosis not present

## 2024-03-25 DIAGNOSIS — R300 Dysuria: Secondary | ICD-10-CM | POA: Diagnosis not present

## 2024-03-30 DIAGNOSIS — F4323 Adjustment disorder with mixed anxiety and depressed mood: Secondary | ICD-10-CM | POA: Diagnosis not present

## 2024-04-27 DIAGNOSIS — F4323 Adjustment disorder with mixed anxiety and depressed mood: Secondary | ICD-10-CM | POA: Diagnosis not present
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
## 2024-05-28 ENCOUNTER — Telehealth (HOSPITAL_COMMUNITY): Admitting: Psychiatry

## 2024-05-28 ENCOUNTER — Encounter (HOSPITAL_COMMUNITY): Payer: Self-pay | Admitting: Psychiatry

## 2024-05-28 DIAGNOSIS — F3132 Bipolar disorder, current episode depressed, moderate: Secondary | ICD-10-CM

## 2024-05-28 DIAGNOSIS — G2401 Drug induced subacute dyskinesia: Secondary | ICD-10-CM

## 2024-05-28 DIAGNOSIS — F411 Generalized anxiety disorder: Secondary | ICD-10-CM | POA: Diagnosis not present

## 2024-05-28 MED ORDER — LITHIUM CARBONATE 300 MG PO TABS: 300.0000 mg | ORAL_TABLET | Freq: Every day | ORAL | 2 refills | Status: DC

## 2024-05-28 MED ORDER — LAMOTRIGINE 100 MG PO TABS: 200.0000 mg | ORAL_TABLET | Freq: Every day | ORAL | 0 refills | Status: AC

## 2024-05-28 MED ORDER — TRAZODONE HCL 100 MG PO TABS: 100.0000 mg | ORAL_TABLET | Freq: Every day | ORAL | 0 refills | Status: AC

## 2024-05-28 MED ORDER — VALBENAZINE TOSYLATE 80 MG PO CAPS: 80.0000 mg | ORAL_CAPSULE | Freq: Every day | ORAL | 2 refills | Status: AC

## 2024-05-28 MED ORDER — BUPROPION HCL ER (XL) 300 MG PO TB24: 300.0000 mg | ORAL_TABLET | ORAL | 0 refills | Status: AC

## 2024-06-25 ENCOUNTER — Other Ambulatory Visit (HOSPITAL_COMMUNITY): Payer: Self-pay | Admitting: Psychiatry

## 2024-06-25 DIAGNOSIS — F3132 Bipolar disorder, current episode depressed, moderate: Secondary | ICD-10-CM

## 2024-07-14 ENCOUNTER — Telehealth (HOSPITAL_COMMUNITY): Admitting: Psychiatry

## 2024-08-19 ENCOUNTER — Ambulatory Visit: Admitting: Dermatology

## 2024-08-25 ENCOUNTER — Ambulatory Visit: Admitting: Dermatology

## 2025-02-18 ENCOUNTER — Ambulatory Visit: Admitting: Dermatology
